# Patient Record
Sex: Female | Born: 1955
Health system: Southern US, Community
[De-identification: ages and names within clinical notes are randomized; demographics above are authoritative.]

## PROBLEM LIST (undated history)

## (undated) DIAGNOSIS — G35 Multiple sclerosis: Secondary | ICD-10-CM

## (undated) DIAGNOSIS — Z79899 Other long term (current) drug therapy: Secondary | ICD-10-CM

## (undated) DIAGNOSIS — L509 Urticaria, unspecified: Secondary | ICD-10-CM

## (undated) DIAGNOSIS — E039 Hypothyroidism, unspecified: Secondary | ICD-10-CM

## (undated) DIAGNOSIS — E079 Disorder of thyroid, unspecified: Secondary | ICD-10-CM

## (undated) DIAGNOSIS — I10 Essential (primary) hypertension: Secondary | ICD-10-CM

## (undated) HISTORY — DX: Essential (primary) hypertension: I10

## (undated) HISTORY — PX: KNEE SURGERY: SHX244

## (undated) HISTORY — PX: OTHER SURGICAL HISTORY: SHX169

## (undated) HISTORY — DX: Multiple sclerosis: G35

## (undated) HISTORY — DX: Disorder of thyroid, unspecified: E07.9

## (undated) HISTORY — DX: Urticaria, unspecified: L50.9

## (undated) HISTORY — DX: Other long term (current) drug therapy: Z79.899

## (undated) HISTORY — PX: TYMPANOSTOMY TUBE PLACEMENT: SHX32

## (undated) HISTORY — PX: VAGINAL HYSTERECTOMY: SUR661

---

## 1998-02-15 ENCOUNTER — Other Ambulatory Visit: Admission: RE | Admit: 1998-02-15 | Discharge: 1998-02-15 | Payer: Self-pay | Admitting: Obstetrics and Gynecology

## 1998-02-28 ENCOUNTER — Ambulatory Visit (HOSPITAL_COMMUNITY): Admission: RE | Admit: 1998-02-28 | Discharge: 1998-02-28 | Payer: Self-pay | Admitting: Obstetrics and Gynecology

## 1998-05-15 ENCOUNTER — Ambulatory Visit (HOSPITAL_COMMUNITY): Admission: RE | Admit: 1998-05-15 | Discharge: 1998-05-15 | Payer: Self-pay | Admitting: Family Medicine

## 1998-05-17 ENCOUNTER — Ambulatory Visit (HOSPITAL_COMMUNITY): Admission: RE | Admit: 1998-05-17 | Discharge: 1998-05-17 | Payer: Self-pay | Admitting: Family Medicine

## 1999-02-18 ENCOUNTER — Other Ambulatory Visit: Admission: RE | Admit: 1999-02-18 | Discharge: 1999-02-18 | Payer: Self-pay | Admitting: Obstetrics and Gynecology

## 1999-04-18 ENCOUNTER — Ambulatory Visit (HOSPITAL_COMMUNITY): Admission: RE | Admit: 1999-04-18 | Discharge: 1999-04-18 | Payer: Self-pay | Admitting: Family Medicine

## 1999-04-18 ENCOUNTER — Encounter: Payer: Self-pay | Admitting: Family Medicine

## 1999-08-05 ENCOUNTER — Emergency Department (HOSPITAL_COMMUNITY): Admission: EM | Admit: 1999-08-05 | Discharge: 1999-08-05 | Payer: Self-pay | Admitting: *Deleted

## 1999-08-05 ENCOUNTER — Encounter: Payer: Self-pay | Admitting: Emergency Medicine

## 2000-03-04 ENCOUNTER — Other Ambulatory Visit: Admission: RE | Admit: 2000-03-04 | Discharge: 2000-03-04 | Payer: Self-pay | Admitting: Obstetrics and Gynecology

## 2000-03-27 ENCOUNTER — Encounter: Payer: Self-pay | Admitting: Otolaryngology

## 2000-03-27 ENCOUNTER — Encounter: Admission: RE | Admit: 2000-03-27 | Discharge: 2000-03-27 | Payer: Self-pay | Admitting: Otolaryngology

## 2001-02-05 ENCOUNTER — Ambulatory Visit (HOSPITAL_COMMUNITY): Admission: RE | Admit: 2001-02-05 | Discharge: 2001-02-05 | Payer: Self-pay | Admitting: Internal Medicine

## 2001-02-05 ENCOUNTER — Encounter: Payer: Self-pay | Admitting: Internal Medicine

## 2001-02-05 ENCOUNTER — Encounter (INDEPENDENT_AMBULATORY_CARE_PROVIDER_SITE_OTHER): Payer: Self-pay | Admitting: *Deleted

## 2001-04-13 ENCOUNTER — Other Ambulatory Visit: Admission: RE | Admit: 2001-04-13 | Discharge: 2001-04-13 | Payer: Self-pay | Admitting: Obstetrics and Gynecology

## 2001-09-21 ENCOUNTER — Encounter: Payer: Self-pay | Admitting: Family Medicine

## 2001-09-21 ENCOUNTER — Ambulatory Visit (HOSPITAL_COMMUNITY): Admission: RE | Admit: 2001-09-21 | Discharge: 2001-09-21 | Payer: Self-pay | Admitting: Family Medicine

## 2001-09-22 ENCOUNTER — Ambulatory Visit (HOSPITAL_COMMUNITY): Admission: RE | Admit: 2001-09-22 | Discharge: 2001-09-22 | Payer: Self-pay | Admitting: Family Medicine

## 2001-09-22 ENCOUNTER — Encounter: Payer: Self-pay | Admitting: Family Medicine

## 2001-10-14 ENCOUNTER — Ambulatory Visit (HOSPITAL_COMMUNITY): Admission: RE | Admit: 2001-10-14 | Discharge: 2001-10-14 | Payer: Self-pay | Admitting: Family Medicine

## 2001-10-14 ENCOUNTER — Encounter: Payer: Self-pay | Admitting: Family Medicine

## 2002-03-09 ENCOUNTER — Encounter: Admission: RE | Admit: 2002-03-09 | Discharge: 2002-03-09 | Payer: Self-pay | Admitting: Family Medicine

## 2002-03-09 ENCOUNTER — Encounter: Payer: Self-pay | Admitting: Family Medicine

## 2002-04-12 ENCOUNTER — Encounter: Admission: RE | Admit: 2002-04-12 | Discharge: 2002-07-11 | Payer: Self-pay | Admitting: Neurosurgery

## 2002-08-25 ENCOUNTER — Other Ambulatory Visit: Admission: RE | Admit: 2002-08-25 | Discharge: 2002-08-25 | Payer: Self-pay | Admitting: Obstetrics and Gynecology

## 2002-11-07 ENCOUNTER — Ambulatory Visit (HOSPITAL_COMMUNITY): Admission: RE | Admit: 2002-11-07 | Discharge: 2002-11-07 | Payer: Self-pay | Admitting: Family Medicine

## 2002-11-07 ENCOUNTER — Encounter: Payer: Self-pay | Admitting: Family Medicine

## 2003-01-02 ENCOUNTER — Emergency Department (HOSPITAL_COMMUNITY): Admission: EM | Admit: 2003-01-02 | Discharge: 2003-01-03 | Payer: Self-pay | Admitting: *Deleted

## 2003-01-03 ENCOUNTER — Encounter: Payer: Self-pay | Admitting: Emergency Medicine

## 2003-01-19 ENCOUNTER — Encounter (HOSPITAL_COMMUNITY): Admission: RE | Admit: 2003-01-19 | Discharge: 2003-04-19 | Payer: Self-pay | Admitting: Internal Medicine

## 2003-01-20 ENCOUNTER — Encounter: Payer: Self-pay | Admitting: Internal Medicine

## 2003-01-23 ENCOUNTER — Encounter: Payer: Self-pay | Admitting: Internal Medicine

## 2004-02-15 ENCOUNTER — Other Ambulatory Visit: Admission: RE | Admit: 2004-02-15 | Discharge: 2004-02-15 | Payer: Self-pay | Admitting: Obstetrics and Gynecology

## 2004-02-20 ENCOUNTER — Emergency Department (HOSPITAL_COMMUNITY): Admission: EM | Admit: 2004-02-20 | Discharge: 2004-02-20 | Payer: Self-pay | Admitting: Emergency Medicine

## 2004-03-13 ENCOUNTER — Encounter: Payer: Self-pay | Admitting: Internal Medicine

## 2004-05-20 ENCOUNTER — Emergency Department (HOSPITAL_COMMUNITY): Admission: EM | Admit: 2004-05-20 | Discharge: 2004-05-20 | Payer: Self-pay | Admitting: Emergency Medicine

## 2004-06-24 ENCOUNTER — Ambulatory Visit (HOSPITAL_COMMUNITY): Admission: RE | Admit: 2004-06-24 | Discharge: 2004-06-24 | Payer: Self-pay | Admitting: Family Medicine

## 2004-09-07 ENCOUNTER — Encounter: Payer: Self-pay | Admitting: Internal Medicine

## 2004-10-04 ENCOUNTER — Ambulatory Visit (HOSPITAL_COMMUNITY): Admission: RE | Admit: 2004-10-04 | Discharge: 2004-10-04 | Payer: Self-pay | Admitting: Neurology

## 2005-04-16 ENCOUNTER — Other Ambulatory Visit: Admission: RE | Admit: 2005-04-16 | Discharge: 2005-04-16 | Payer: Self-pay | Admitting: Obstetrics and Gynecology

## 2005-09-20 ENCOUNTER — Emergency Department (HOSPITAL_COMMUNITY): Admission: EM | Admit: 2005-09-20 | Discharge: 2005-09-20 | Payer: Self-pay | Admitting: Family Medicine

## 2005-10-25 IMAGING — CR DG CHEST 2V
2 series · 2 of 2 positions shown · non-contrast
Comparison: none

CLINICAL DATA: Recovering addict.  Twist in lip. 
 TWO VIEW CHEST
 PA and lateral views of the chest are made 02/20/2004 at 8771 hours and are compared to a previous portable study of 01/03/2003 and show the heart, lungs, bony thorax and soft tissues to be normal. 
 IMPRESSION
 Normal chest.

[view not recorded (1 of 2)]
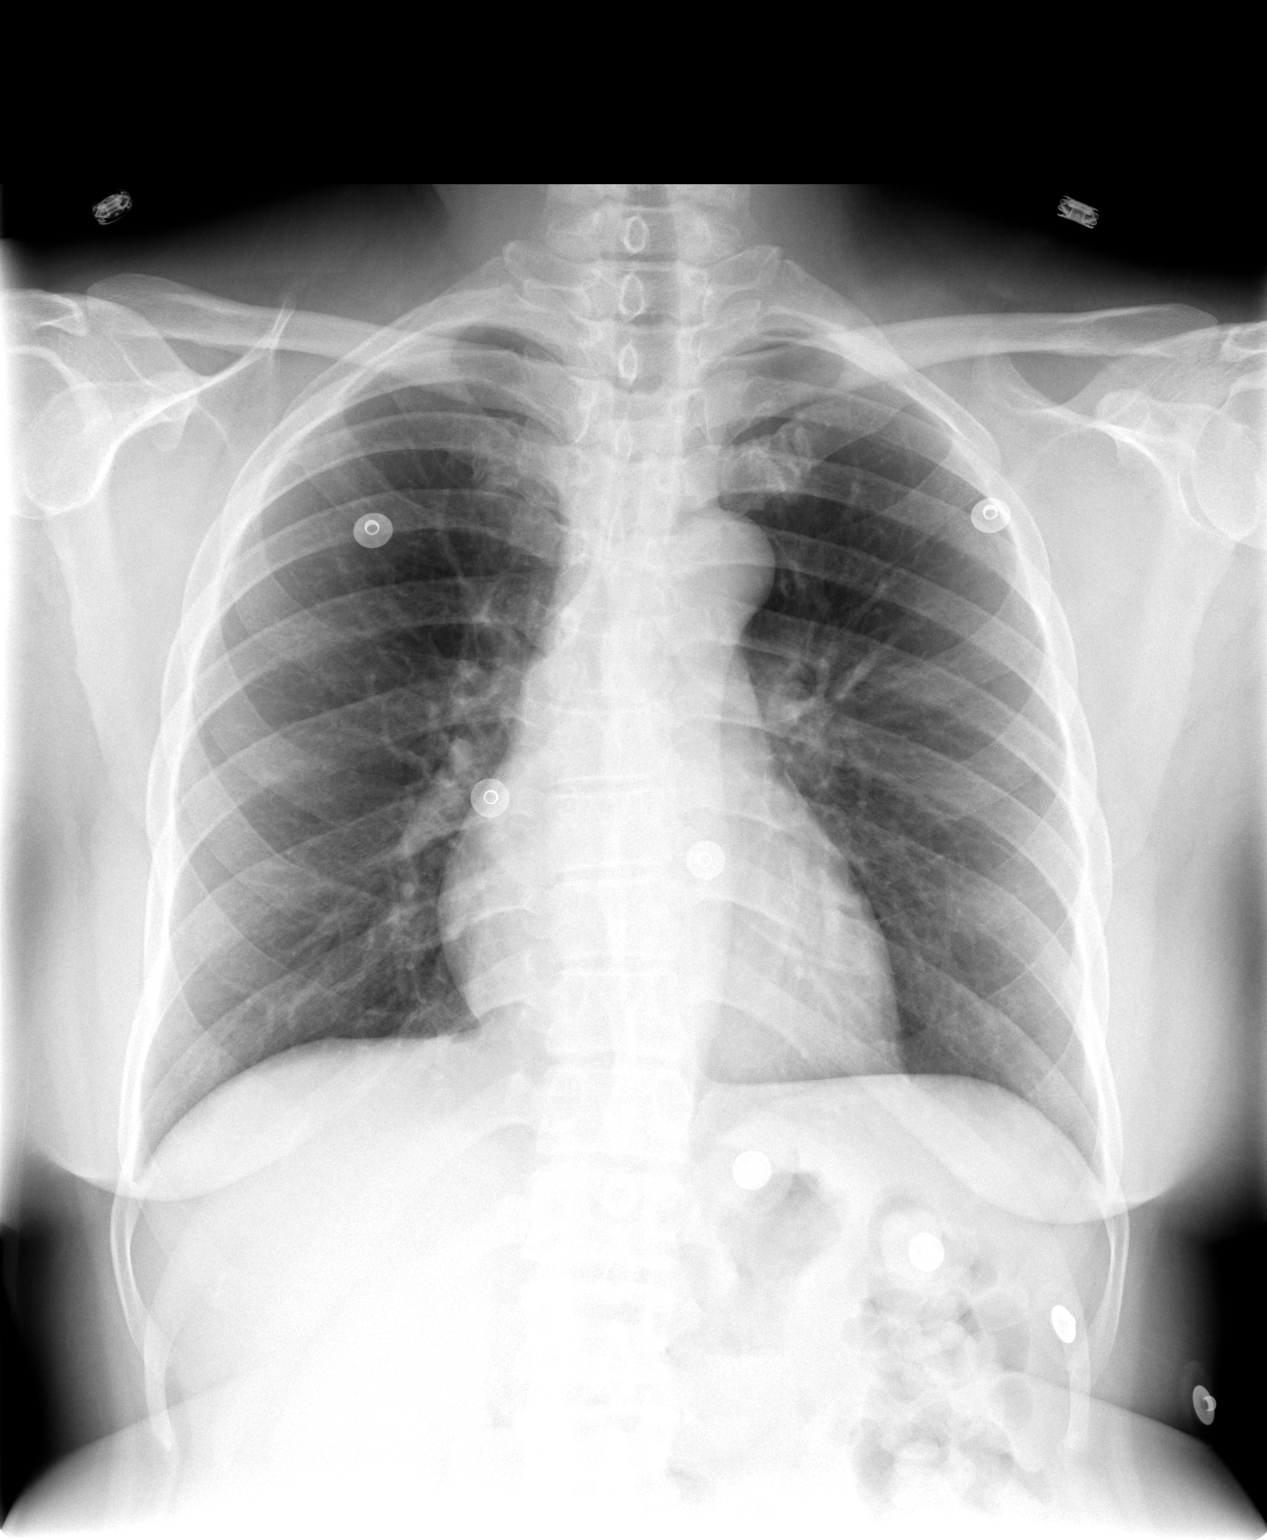

[view not recorded (2 of 2)]
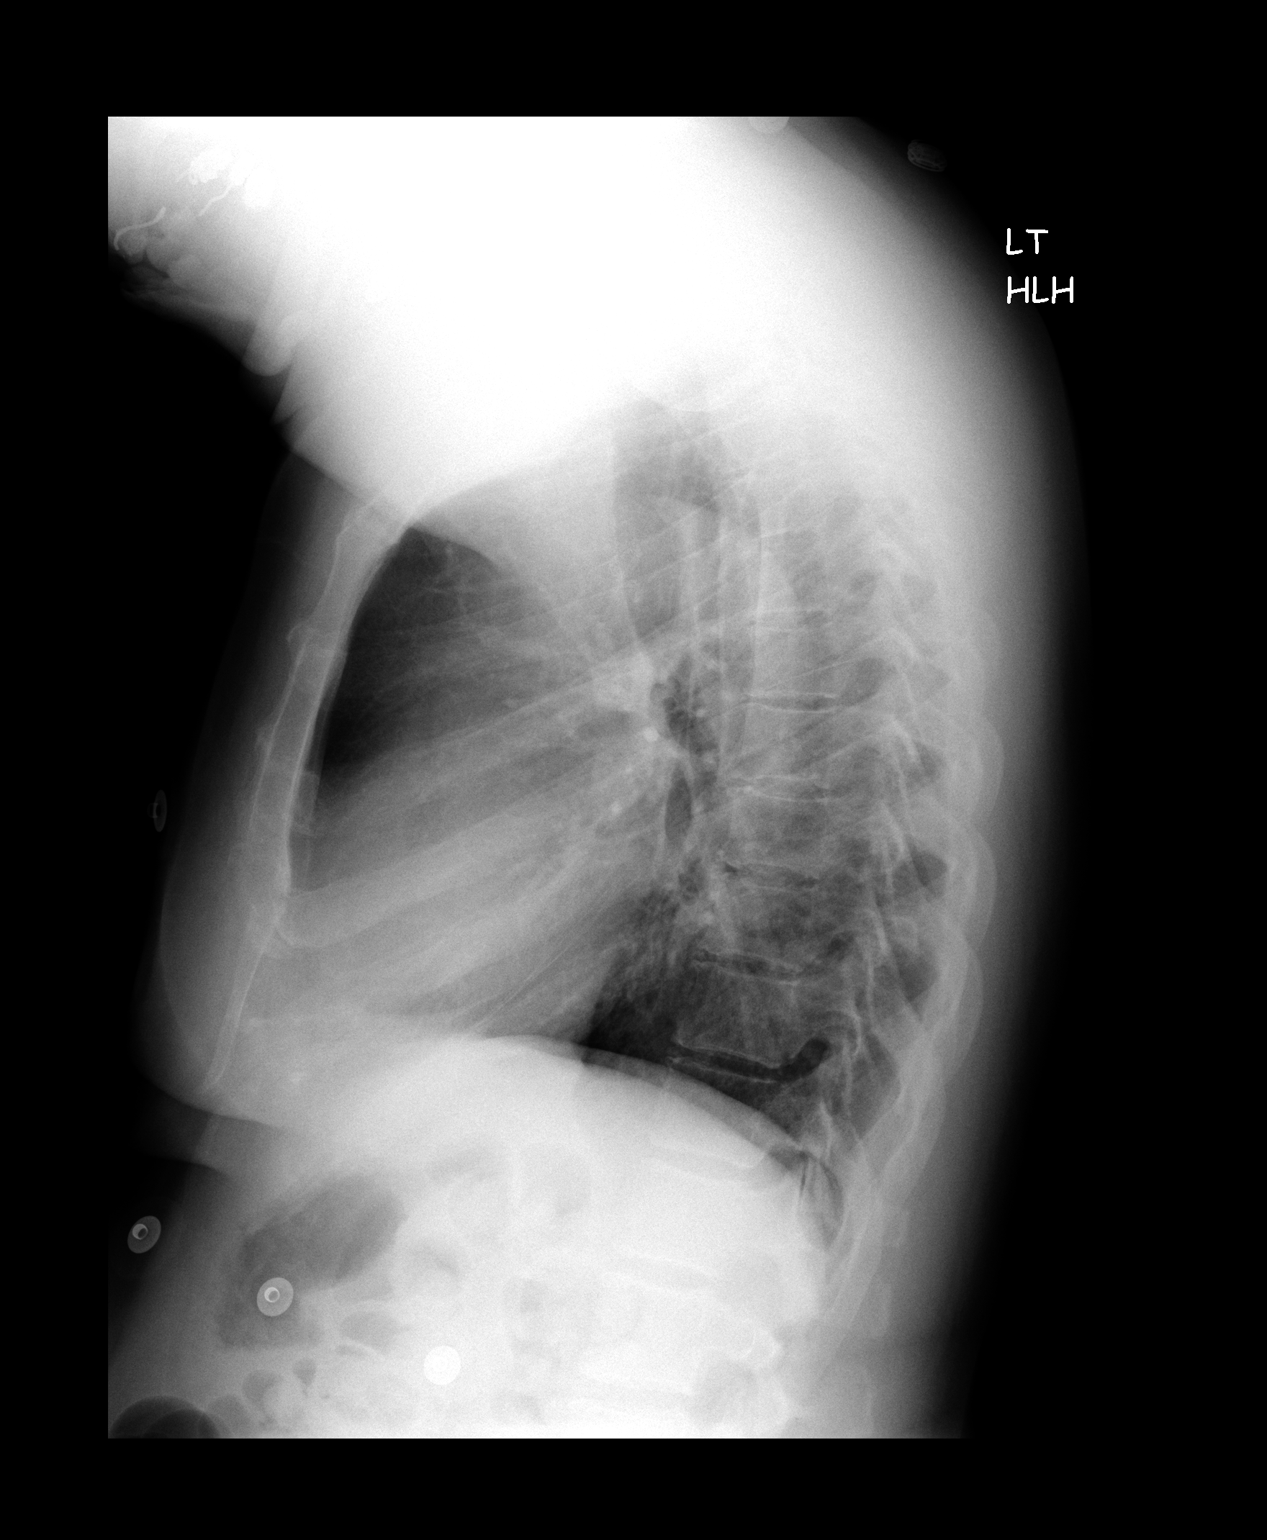

[2 of 2 positions shown; findings below may reference images not displayed]

## 2005-12-26 ENCOUNTER — Emergency Department (HOSPITAL_COMMUNITY): Admission: EM | Admit: 2005-12-26 | Discharge: 2005-12-26 | Payer: Self-pay | Admitting: Emergency Medicine

## 2006-09-10 ENCOUNTER — Inpatient Hospital Stay (HOSPITAL_COMMUNITY): Admission: EM | Admit: 2006-09-10 | Discharge: 2006-09-11 | Payer: Self-pay | Admitting: Emergency Medicine

## 2006-09-12 ENCOUNTER — Emergency Department (HOSPITAL_COMMUNITY): Admission: EM | Admit: 2006-09-12 | Discharge: 2006-09-12 | Payer: Self-pay | Admitting: Emergency Medicine

## 2006-11-02 ENCOUNTER — Inpatient Hospital Stay (HOSPITAL_COMMUNITY): Admission: EM | Admit: 2006-11-02 | Discharge: 2006-11-06 | Payer: Self-pay | Admitting: Emergency Medicine

## 2006-11-04 ENCOUNTER — Encounter (INDEPENDENT_AMBULATORY_CARE_PROVIDER_SITE_OTHER): Payer: Self-pay | Admitting: Cardiology

## 2007-10-31 ENCOUNTER — Emergency Department (HOSPITAL_COMMUNITY): Admission: EM | Admit: 2007-10-31 | Discharge: 2007-10-31 | Payer: Self-pay | Admitting: Emergency Medicine

## 2008-04-06 ENCOUNTER — Ambulatory Visit (HOSPITAL_COMMUNITY): Admission: RE | Admit: 2008-04-06 | Discharge: 2008-04-06 | Payer: Self-pay | Admitting: Internal Medicine

## 2009-04-10 ENCOUNTER — Encounter (INDEPENDENT_AMBULATORY_CARE_PROVIDER_SITE_OTHER): Payer: Self-pay | Admitting: *Deleted

## 2009-04-13 ENCOUNTER — Telehealth: Payer: Self-pay | Admitting: Internal Medicine

## 2009-04-24 ENCOUNTER — Ambulatory Visit: Payer: Self-pay | Admitting: Gastroenterology

## 2009-04-24 DIAGNOSIS — K602 Anal fissure, unspecified: Secondary | ICD-10-CM | POA: Insufficient documentation

## 2009-04-24 DIAGNOSIS — R198 Other specified symptoms and signs involving the digestive system and abdomen: Secondary | ICD-10-CM | POA: Insufficient documentation

## 2009-04-24 DIAGNOSIS — R12 Heartburn: Secondary | ICD-10-CM

## 2009-04-24 DIAGNOSIS — K625 Hemorrhage of anus and rectum: Secondary | ICD-10-CM

## 2009-05-15 ENCOUNTER — Ambulatory Visit: Payer: Self-pay | Admitting: Internal Medicine

## 2009-05-15 DIAGNOSIS — K5900 Constipation, unspecified: Secondary | ICD-10-CM | POA: Insufficient documentation

## 2009-05-15 DIAGNOSIS — Z860100 Personal history of colon polyps, unspecified: Secondary | ICD-10-CM | POA: Insufficient documentation

## 2009-05-15 DIAGNOSIS — R1032 Left lower quadrant pain: Secondary | ICD-10-CM

## 2009-05-15 DIAGNOSIS — K6289 Other specified diseases of anus and rectum: Secondary | ICD-10-CM

## 2009-05-15 DIAGNOSIS — K644 Residual hemorrhoidal skin tags: Secondary | ICD-10-CM | POA: Insufficient documentation

## 2009-05-22 ENCOUNTER — Ambulatory Visit: Payer: Self-pay | Admitting: Internal Medicine

## 2009-11-16 ENCOUNTER — Encounter: Admission: RE | Admit: 2009-11-16 | Discharge: 2009-11-16 | Payer: Self-pay | Admitting: Family Medicine

## 2010-08-08 ENCOUNTER — Encounter: Admission: RE | Admit: 2010-08-08 | Discharge: 2010-08-08 | Payer: Self-pay | Admitting: Obstetrics and Gynecology

## 2011-01-06 ENCOUNTER — Emergency Department (HOSPITAL_COMMUNITY): Admission: EM | Admit: 2011-01-06 | Payer: Self-pay | Source: Home / Self Care

## 2011-03-21 NOTE — H&P (Signed)
Susan Gomez, Susan Gomez              ACCOUNT NO.:  [id number]   MEDICAL RECORD NO.:  [id number]          PATIENT TYPE:  EMS   LOCATION:  MAJO                         FACILITY:  [facility]   PHYSICIAN:  Deanna Artis. Hickling, M.D.DATE OF BIRTH:  November 30, 1955   DATE OF ADMISSION:  09/10/2006  DATE OF DISCHARGE:                                HISTORY & PHYSICAL   CHIEF COMPLAINT:  Problem speaking, tremor in the right arm.   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  She was at [facility]  tonight when she had onset of inability to read or to speak.  She went on  home.  She was last known well around 1845.  Her husband discovered her  problem with her speech and tremor in her hands and insisted that she come  to the hospital.  They arrived at 2110.  Code Stroke was declared at 2131,  CT at 2138, interpreted by me at 2140.   The patient has a history of hypertension, multiple sclerosis on Betaseron,  and hypothyroidism.   PAST SURGICAL HISTORY:  Positive for hysterectomy.   MEDICATIONS:  1. Benazepril 05/10 mg one daily.  2. Betaseron every other day.  3. Synthroid 137 mcg daily.   DRUG ALLERGIES:  None.   FAMILY HISTORY:  Noncontributory.   REVIEW OF SYSTEMS:  Review of systems x12 is negative.  Patient has had no  illnesses, injuries or other organ system dysfunction.   SOCIAL HISTORY:  The patient does not use tobacco, alcohol or drugs.  She  quit all these when she stopped drugs.  She is married.  Her grandchildren  (three) stay at her home almost every night.   EXAMINATION TODAY:  VITAL SIGNS:  Blood pressure 157/104, resting pulse 64,  respirations 20, oxygen saturation 97%, temperature 97.1.  EARS, NOSE AND THROAT:  No bruits.  LUNGS:  Clear.  HEART:  No murmurs.  Pulses normal.  ABDOMEN:  Soft.  Bowel sounds normal.  No hepatosplenomegaly.  EXTREMITIES:  Normal.  NEUROLOGIC:  The patient is  left-handed.  She is awake, alert.  NIH Stroke  Scale is 3.  There is no dysphasia or dyspraxia.  She has dysarthria but is  really mumbling.  Cranial nerves:  Round, reactive pupils.  Visual fields  full to double simultaneous stimuli.  She is pursing her lips and screwing  her face up to the right side and when she drinks her face becomes  symmetrical, as it is when she smiles.  Tongue and uvula are midline.  Motor  examination:  Normal strength.  She has giveaway to a mild degree a right  leg.  There is no drift.  Fine motor movements were okay.  She has a right  factitious tremor of her hand and arm.  Sensory:  Right hypesthesias, she  splits the midline.  Tuning fork is greater on the left.  She has good  stereoagnosis.  Cerebellar examination:  Finger-to-nose is okay bilaterally.  Heel-knee-shin okay bilaterally.  Gait not tested.   IMPRESSION:  1. Paresthesias, 782.0.  2. Tremor 333.3.  The patient's hypesthesia, facial weakness and tremor      are functional.  3. Hypertension, 404.10.  4. Multiple sclerosis, 340.   PLAN:  We will admit the patient for evaluation.  It is possible that she  could be having an exacerbation of her MS, but I think it is unlikely.  In  my opinion, she has not had a stroke.  She is beyond the 3-hour window for  use of t-PA.  Because of her low NIH stroke scale and my strong suspicion  that this is functional, I would not place her in a study protocol.  MRI  scan of the brain without and with contrast will be carried on the morning  and if normal, we will discharge her to home.      Deanna Artis. Sharene Skeans, M.D.  Electronically Signed     WHH/MEDQ  D:  09/10/2006  T:  09/11/2006  Job:  [id number]   cc:   Holley Bouche, M.D.

## 2011-03-21 NOTE — Consult Note (Signed)
Susan Gomez, DOUCETTE NO.:  [id number]   MEDICAL RECORD NO.:  [id number]          PATIENT TYPE:  EMS   LOCATION:  MAJO                         FACILITY:  [facility]   PHYSICIAN:  Genene Churn. Love, M.D.    DATE OF BIRTH:  [birth year]   DATE OF CONSULTATION:  09/12/2006  DATE OF DISCHARGE:                                   CONSULTATION   REASON FOR CONSULTATION:  This 55 year old left-handed black married female  is seen in the emergency room for evaluation of tremors and mouth  tightening.  A code stroke was called.   HISTORY OF PRESENT ILLNESS:  Mrs. Fedorko has a past history of recovery from  drug use.  She has been free of drugs for 16 years.  She has had MS  diagnosed in February 2006 and has been on Betaseron therapy.  She has a  long history of hypertension and was admitted September 10, 2006 for tremors  and face tightening and discharged September 11, 2006.  At that time, she was  not found to have evidence of a stroke.  An MRI did show some small white-  matter changes but very few.  There was no evidence of an acute stroke  present.  Her telemetry in the hospital was unremarkable.  Her MRI showed no  diffusion abnormalities, nothing to suggest an acute stroke.  She was  discharged yesterday but today complained of tightening of the right side of  her face and began having tremors.  She came to the emergency room.  There  was no evidence of a seizure witnessed.  Code stroke was called.  She denies  any headache, single eye visual loss, double vision, etc.   PAST MEDICAL HISTORY:  Significant for multiple sclerosis, hypertension,  drug use in the past and anxiety.   MEDICATIONS:  1. Betaseron 0.25 mg subcu q.i.d.  2. Norvasc 5 mg daily.  3. Benazepril 10 mg p.o. daily.  4. Synthroid 157 mcg daily.   PHYSICAL EXAMINATION:  GENERAL:  A well-developed obese black female with  distractible tremor and mouth tightening.  VITAL SIGNS:  Blood pressure in right and left  arm was 180/100.  Heart rate  was 84.  NECK:  There were no bruits.  NEUROLOGIC:  She was alert and oriented x3.  Her visual fields were full.  Disks were flat.  Extraocular movements were full and corneals were present.  There was no 7th nerve palsy.  Tongue was midline.  Uvula was midline.  Gag  was present.  Sternocleidomastoid and trapezius testing were normal.  She  had a mild dysarthria.  Motor examination revealed that she had give-away  testing of the right hand and arm but no real drift.  She was intact to  pinprick touch, proprioception, and vibration.  Deep tendon reflexes 2+.  Plantar responses were downgoing.   LABORATORIES:  CT scan of the brain was normal and other blood studies were  pending at this time.   IMPRESSION:  1. Possible multiple sclerosis attack, code 340.  2. Anxiety with voluntary movement disorder, code 300.00 and 781.0.  3. Hypertension, code 796.3.  4. History of drug use.   PLAN:  The plan at this time is to give her a course of prednisone taper,  check her blood studies and let her return on a p.r.n. basis should she have  difficulties.           ______________________________  Genene Churn. Sandria Manly, M.D.     JML/MEDQ  D:  09/12/2006  T:  09/12/2006  Job:  [id number]

## 2011-03-21 NOTE — Consult Note (Signed)
NAMEMAKYIAH, LIE NO.:  [id number]   MEDICAL RECORD NO.:  [id number]          PATIENT TYPE:  EMS   LOCATION:  MAJO                         FACILITY:  [facility]   PHYSICIAN:  Genene Churn. Love, M.D.    DATE OF BIRTH:  12-03-1955   DATE OF CONSULTATION:  09/12/2006  DATE OF DISCHARGE:                                   CONSULTATION   ADDENDUM:   LABORATORY DATA:  Sodium is 137, potassium is 3.5, chloride is 102, CO2  content is 24, glucose is 97, BUN is 11, creatinine is 0.9 and calcium is  9.4.  Total protein is 7.4, albumin is 4, SGOT is 16, SGPT is 9, alkaline  phosphatase is 38, total bilirubin is 1.  White blood cell count is 5600,  hemoglobin is 13, hematocrit is 37.2 and platelet count is 258 K.  ProTime  was 12.4, INR was 0.9, and PTT was 30.           ______________________________  Genene Churn. Sandria Manly, M.D.     JML/MEDQ  D:  09/12/2006  T:  09/12/2006  Job:  [id number]

## 2011-03-21 NOTE — Consult Note (Signed)
Susan Gomez, Susan Gomez              ACCOUNT NO.:  [id number]   MEDICAL RECORD NO.:  [id number]          PATIENT TYPE:  INP   LOCATION:  2023                         FACILITY:  [facility]   PHYSICIAN:  Armanda Magic, M.D.     DATE OF BIRTH:  Mar 02, 1956   DATE OF CONSULTATION:  DATE OF DISCHARGE:                                 CONSULTATION   CHIEF COMPLAINT:  Chest pain.   HISTORY OF PRESENT ILLNESS:  This is a 55 year old black female with a  history of hypertension, hypothyroidism, multiple sclerosis who  presented to the emergency room complaining of chest pain.  Her symptoms  started about 3-4 days ago and she saw her primary care physician.  She  was evaluated by me in the office and was set up for an outpatient  stress test because of atypical chest pain but because of persistence of  symptoms she presented to the emergency room.  Cardiac enzymes were  normal in the ER.  EKG was unremarkable except for nonspecific T-wave  abnormality as well as her chest x-ray was unremarkable too.  She did  have some problems with sinus bradycardia.  She has been noticing some  problems with fatigue and dizziness recently.  She describes the pain as  intermittent sharp pain that radiates from her midsternal area up into  her throat, described also as a burning sensation.  There is some  shortness of breath occasionally.  She does have problems with  gastroesophageal reflux but says typically these are not symptoms.   PAST MEDICAL HISTORY:  Includes gastroesophageal reflux, hypertension,  hypothyroidism,  multiple sclerosis.   MEDICATIONS:  Include amlodipine, benazepril 10/20 mg p.o. daily, HCTZ  25 mg a day, metoprolol 25 mg a day, Prevacid 30 mg a day, Betaseron 1  mL p.o. every other day and Synthroid 137 mcg daily.   ALLERGIES:  None.   SOCIAL HISTORY:  She denies any tobacco or alcohol use.  No IV drug use.  She used to do cocaine but only some inhaled.   FAMILY HISTORY:  Her father died  of cancer.  Her mother died of an MI.  She has a brother who died at 39 of an MI.   REVIEW OF SYSTEMS:  Otherwise as stated in HPI includes lightheadedness  and problems with feeling tired.   PHYSICAL EXAMINATION:  GENERAL:  Her blood pressure is 111/64 mmHg,  pulse 43, respirations 18, O2 saturations 98% on room air. She is a well-  developed, well-nourished obese black female in no acute distress.  HEENT:  Benign.  NECK:  Supple without lymphadenopathy.  Carotid upstrokes +2  bilaterally, no bruits.  LUNGS: Clear to auscultation throughout.  HEART:  Regular rate and rhythm but slow, no murmurs, rubs or gallops.  Normal S1, S2.  ABDOMEN:  Benign.  EXTREMITIES: No edema.   LABORATORY DATA:  Her sodium is 138, potassium 3.4, chloride 105,  glucose 86, BUN 8.  CBC, white cell count 4.9, hemoglobin 12.6,  hematocrit 36.1, platelet count 261.  LFTs are normal.  INR 0.9. D-dimer  is less than 0.22.  Initial CPK  is 155 with MB of 0.5, troponin less  than 0.05.  Second CPK was 136, MB 0.5, troponin 0.02.  Chest x-ray  shows stable mild cardiomegaly.  EKG shows sinus bradycardia 45 beats  per minute with nonspecific T-wave abnormality, normal QTC, occasional  PVC noted on EKG back in November.   IMPRESSION:  1. Atypical chest pain in a patient with cardiac risk factors      including obesity, hypertension, family history of early age,      cardiac enzymes are negative x2, D-dimer is negative.  EKG shows      nonspecific T-wave changes.  2. Multiple sclerosis.  3. Hypertension.  4. Bradycardia with fatigue and lightheadedness.  She did receive      atropine last night for a heart rate of 35 when she was lying in      bed.  She apparently says she has not taken her beta blocker in 5      days but had been on metoprolol.  5. Hypothyroidism.   PLAN:  1. Stress Cardiolite study the morning.  2. Check 2D echocardiogram to rule out structural heart disease.  3. Check a TSH to make sure  she does not have significant      hypothyroidism.      Armanda Magic, M.D.  Electronically Signed     TT/MEDQ  D:  11/03/2006  T:  11/03/2006  Job:  [id number]   cc:   Deboraha Sprang @ [facility]

## 2011-03-21 NOTE — Discharge Summary (Signed)
NAMEPRICELLA, Susan Gomez              ACCOUNT NO.:  [id number]   MEDICAL RECORD NO.:  [id number]          PATIENT TYPE:  INP   LOCATION:  2023                         FACILITY:  [facility]   PHYSICIAN:  Barnetta Chapel, MDDATE OF BIRTH:  1955/11/07   DATE OF ADMISSION:  11/02/2006  DATE OF DISCHARGE:  11/06/2006                               DISCHARGE SUMMARY   PRIMARY CARE PHYSICIAN:  Dr. Holley Bouche.   ADMITTING DIAGNOSES:  Includes:  1. Chest pain.  2. Bradycardia.  3. Mild renal insufficiency.  4. Hypertension.  5. Hypothyroidism.   DISCHARGE DIAGNOSES:  Includes:  1. Hypothyroidism.  2. Bradycardia.  3. Chest pain, the cause is unknown.  4. Hypertension.   DISCHARGE MEDICATIONS:  Include:  1. HCTZ 25 mg once daily.  2. Prevacid 30 mg p.o. once daily.  3. Lisinopril 20 mg p.o. once daily.  4. Synthroid 150 mcg p.o. once daily.  5. EC aspirin 81 mg p.o. once daily.  6. Lexapro 10 mg p.o. once daily.  7. Betaseron 150 p.o. every other day.   CONSULTATIONS DONE:  She was seen by Dr. Armanda Magic.  A cardiac stress  test was planned but not done because significant bradycardia.   SIGNIFICANT LAB TESTS:  Cardiac enzymes were negative.  EKG revealed  significant bradycardia.  Also, the Holter monitor revealed heart rate  that was as low as 33 beats per minute.  A thyroid stimulating hormone  level was 73.01.  The patient's creatinine was 1.1.   BRIEF HISTORY AND HOSPITAL COURSE:  Please refer to the H&P done on  November 02, 2006.  The patient is a 55 year old female with past  medical history significant for hypothyroidism, hypertension and GERD.  The patient was admitted with history of chest pain.  According to the  patient, she said that the chest pain was left-sided and was radiating  up to the neck.  The pain would last for about 15 minutes.  On  admission, the patient's EKG revealed significant bradycardia.  The  cardiac enzymes were all negative.  Cardiology  consult was called and  the patient was seen by Dr. Armanda Magic of the [facility].  The plan was for the patient to have the stress test but this was  cancelled because of significant bradycardia.  TSh was elevated (Please  see above).  The patient's Synthroid was increased from 137 mcg p.o.  once daily to 150 mcg p.o. once daily.  The patient insists on being  compliant with the thyroid medication.  We increased dose of Synthroid,  the patient's heart rate did gradually increase to 50-60s.  On the day  of discharge, the heart rate was in the 60s and the chest pain had  resolved.  No shortness of breath.  The plan is for the patient to have  a cardiac stress test as an outpatient.   DISCHARGE PLANS:  1. Discharge patient back home today.  2. Patient to follow up with the primary care physician in a week.  3. There will be need for the primary care physician to check the  patient's TSH in a week.  4. The patient is to follow up with Dr. Fraser Din Clarkin1-2 weeks..      Dr. Chestine Spore sees the patient for the thyroid problem.  5. Follow up with Dr. Armanda Magic of [facility].  This patient      will need an outpatient cardiac stress test.  6. Cardiac diet.  7. Activity as tolerated.      Barnetta Chapel, MD  Electronically Signed     SIO/MEDQ  D:  11/06/2006  T:  11/07/2006  Job:  [id number]   cc:   Holley Bouche, M.D.  Margaretmary Bayley, M.D.  Armanda Magic, M.D.

## 2011-03-21 NOTE — H&P (Signed)
Susan Gomez, Susan Gomez              ACCOUNT NO.:  [id number]   MEDICAL RECORD NO.:  [id number]          PATIENT TYPE:  INP   LOCATION:  1826                         FACILITY:  [facility]   PHYSICIAN:  Hollice Espy, M.D.DATE OF BIRTH:  1956-09-10   DATE OF ADMISSION:  11/02/2006  DATE OF DISCHARGE:                              HISTORY & PHYSICAL   CONSULTANTS ON THIS CASE:  Dr. Armanda Magic, [facility].   PRIMARY CARE PHYSICIAN:  Heritage manager at [facility].   CHIEF COMPLAINT:  Chest pain.   HISTORY OF PRESENT ILLNESS:  The patient is a 55 year old white female  past medical history of hypertension and hypothyroidism who presents to  the emergency room complaining of continued chest discomfort.  Actually  her symptoms started about 3-4 days ago and she saw her PCP.  At that  time she was referred over to Dr. Mayford Knife who saw the patient and had  plans to set up the patient for an outpatient stress test; however, her  symptoms persisted and she came into the emergency room for further  evaluation.  In the emergency room the patient had labs checked and had  a normal set of cardiac enzymes.  Her EKG was unremarkable as was her  chest x-ray.  She was noted to have some spiked sinus bradycardia,  however.  The patient now otherwise is doing well however, her symptoms  have persisted and intermittent chest burning midsternal radiated to the  left side with associated shortness of breath and also up to her jaw  bilaterally.  She feels tired but she denies any headaches, vision  changes, dysphagia, no palpitations, no wheezing or coughing.  She says  when her reflux flares up this is not what it feels like.  She denies  any abdominal pain, hematuria, dysuria, constipation, diarrhea, focal  extremity numbness, weakness or pain.  Review of systems is otherwise  negative.   PAST MEDICAL HISTORY:  The patient's past medical history includes  hyperthyroidism, hypertension, GERD.   MEDICATIONS:  She is on amlodipine/benazepril 10/25 p.o. daily,  hydrochlorothiazide 25 p.o. daily, metoprolol 25 p.o. daily, Prevacid 30  p.o. daily, Betaseron 1 mL p.o. every other day and Synthroid 137 mcg  p.o. daily.   ALLERGIES:  SHE HAS NO KNOWN DRUG ALLERGIES.   SOCIAL HISTORY:  She denies any tobacco, alcohol or drug use.   FAMILY HISTORY:  Noncontributory.   PHYSICAL EXAMINATION:  PATIENT'S VITALS ON ADMISSION:  Temperature 97.4,  heart rate 45, blood pressure 138/95, respirations 20, O2 saturation 98%  on room air.  GENERAL:  The patient is alert and oriented x3 in no apparent distress.  HEENT:  Normocephalic, atraumatic.  Her mucous membranes are moist.  She  has no carotid bruits.  HEART:  Regular rhythm, mild bradycardia.  LUNGS:  Clear to auscultation bilaterally.  ABDOMEN:  Soft, nontender, nondistended.  Positive bowel sounds.  EXTREMITIES:  No clubbing, cyanosis or edema.   LABORATORY WORK:  EKG shows sinus bradycardia.  INR is 1.  D-dimer less  than 0.22.  CPK 80.6, MB 1, troponin I less than 0.05.  Sodium 138,  potassium 3.4, chloride 105, bicarb 28, BUN 8, creatinine 1.2, glucose  86.   ASSESSMENT AND PLAN:  1. Chest pain.  I have discussed with Dr. Mayford Knife who will see the      patient and plan for a stress test on November 04, 2006.  2. Bradycardia.  We will check a TSH level.  3. Mild renal insufficiency.  This may be some mild dehydration but      with a normal BUN may be long term secondary to hypertension.  4. Hypertension.  Continue medications.  5. Hypothyroidism.  Continue Synthroid.      Hollice Espy, M.D.  Electronically Signed     SKK/MEDQ  D:  11/02/2006  T:  11/02/2006  Job:  [id number]   cc:   Deboraha Sprang at [facility]

## 2011-03-21 NOTE — Discharge Summary (Signed)
NAMEZANAYA, BAIZE              ACCOUNT NO.:  [id number]   MEDICAL RECORD NO.:  [id number]          PATIENT TYPE:  INP   LOCATION:  3030                         FACILITY:  [facility]   PHYSICIAN:  Deanna Artis. Hickling, M.D.DATE OF BIRTH:  11-26-1955   DATE OF ADMISSION:  09/10/2006  DATE OF DISCHARGE:  09/11/2006                                 DISCHARGE SUMMARY   FINAL DIAGNOSES:  1. Dysarthria, 784.5; right body numbness, 782.0; tremor, 333.1.  These      all appear to be functional in nature.  2. Hypertension.  3. Multiple sclerosis.   PROCEDURES:  1. MRI of the brain without and with contrast showed no acute lesions or      scattered areas of white matter change that were not classic for      multiple sclerosis in particular, none within the corpus callosum and      none that were in the immediate adjacent periventricular white matter.  2. CT scan of the brain, normal.   COMPLICATIONS:  None.   SUMMARY OF HOSPITALIZATION:  Patient was admitted with inability to speak,  which cleared to a dysarthria.  Complaints of right-sided numbness, right  facial weakness, and a right tremor that proved to be factitious.  She had a  nonfocal neurologic examination other than the areas noted.  She had a  swallowing study, which was normal.  Her NIH Stroke Scale was 3 (1 for the  sensory, 1 for the facial weakness, and 1 for the dysarthria).  The patient  was pursing her lips and pulling them up to the right side.  She was doing  this much less today.  When she drinks fluid it goes away altogether  When  she smiles, she has a symmetric facial strength.   On examination today, she has no focal or lateralized findings.  Her gait is  normal.  I do not see evidence of a tremor.  The hypesthesia is gone.  Temperature 97.4.  Blood pressure 159/103.  Resting pulse 63.  Resting  respirations 22.  Oxygen saturation 100.   PLAN:  1. We will have her go home on Norvasc 10 mg a day.  2. And continue  benazepril at its current dose of 10 mg a day.  3. She needs to see her primary care doctor for further titration of her      blood pressure.  She should follow up in December 2007 at [facility] for her multiple sclerosis.  She will be seen by      Darrol Angel.  I will be there that day and would be happy to assist      in her care.  She is ordinarily seen by Dr. Clearnce Hasten.   Laboratory studies were dictated in the history and physical examination and  will not be recounted here.  Patient is discharged in improved condition.      Deanna Artis. Sharene Skeans, M.D.  Electronically Signed     WHH/MEDQ  D:  09/11/2006  T:  09/12/2006  Job:  [id number]

## 2011-08-08 LAB — I-STAT 8, (EC8 V) (CONVERTED LAB)
BUN: 6
Bicarbonate: 27.3 — ABNORMAL HIGH
Glucose, Bld: 97
Sodium: 142
TCO2: 29
pCO2, Ven: 44.2 — ABNORMAL LOW
pH, Ven: 7.4 — ABNORMAL HIGH

## 2011-08-08 LAB — POCT CARDIAC MARKERS
CKMB, poc: <1 — ABNORMAL LOW
Myoglobin, poc: 51.9
Operator id: 261381
Troponin i, poc: <0.05

## 2011-08-08 LAB — DIFFERENTIAL
Basophils Absolute: 0.1
Eosinophils Relative: 2
Lymphocytes Relative: 61 — ABNORMAL HIGH
Lymphs Abs: 3.8
Neutro Abs: 1.8

## 2011-08-08 LAB — POCT I-STAT CREATININE: Operator id: 261381

## 2011-08-08 LAB — CBC
HCT: 35.7 — ABNORMAL LOW
Hemoglobin: 12.1
Platelets: 302
WBC: 6.3

## 2012-04-05 ENCOUNTER — Telehealth: Payer: Self-pay

## 2012-05-18 ENCOUNTER — Other Ambulatory Visit: Payer: Self-pay | Admitting: Family Medicine

## 2012-05-18 DIAGNOSIS — R2230 Localized swelling, mass and lump, unspecified upper limb: Secondary | ICD-10-CM

## 2012-05-21 ENCOUNTER — Ambulatory Visit
Admission: RE | Admit: 2012-05-21 | Discharge: 2012-05-21 | Disposition: A | Discharge status: Home / Self Care | Payer: BC Managed Care – PPO | Source: Ambulatory Visit | Attending: Family Medicine | Admitting: Family Medicine

## 2012-05-21 DIAGNOSIS — R2230 Localized swelling, mass and lump, unspecified upper limb: Secondary | ICD-10-CM

## 2013-01-24 ENCOUNTER — Other Ambulatory Visit: Payer: Self-pay | Admitting: Internal Medicine

## 2013-01-24 DIAGNOSIS — E042 Nontoxic multinodular goiter: Secondary | ICD-10-CM

## 2013-01-28 ENCOUNTER — Ambulatory Visit (HOSPITAL_COMMUNITY)
Admission: RE | Admit: 2013-01-28 | Discharge: 2013-01-28 | Disposition: A | Discharge status: Home / Self Care | Payer: BC Managed Care – PPO | Source: Ambulatory Visit | Attending: Internal Medicine | Admitting: Internal Medicine

## 2013-01-28 DIAGNOSIS — R599 Enlarged lymph nodes, unspecified: Secondary | ICD-10-CM | POA: Insufficient documentation

## 2013-01-28 DIAGNOSIS — E042 Nontoxic multinodular goiter: Secondary | ICD-10-CM

## 2013-09-23 ENCOUNTER — Encounter: Payer: Self-pay | Admitting: Neurology

## 2013-09-23 ENCOUNTER — Ambulatory Visit (INDEPENDENT_AMBULATORY_CARE_PROVIDER_SITE_OTHER): Payer: BC Managed Care – PPO | Admitting: Neurology

## 2013-09-23 VITALS — BP 151/98 | HR 49 | Ht 63.75 in | Wt 189.0 lb

## 2013-09-23 DIAGNOSIS — G35 Multiple sclerosis: Secondary | ICD-10-CM

## 2013-09-23 MED ORDER — GABAPENTIN 300 MG PO CAPS: 300.0000 mg | ORAL_CAPSULE | Freq: Three times a day (TID) | ORAL | Status: DC

## 2013-09-23 NOTE — Progress Notes (Signed)
_  [facility]  PATIENT: Susan Gomez DOB: May 07, 1956  HISTORICAL  [name] is a 57 year-old black female, referred by her primary care physician [name], to continue followup for her multiple sclerosis, and recent worsening of low back pain, last clinical visit was in 2011   She is a prior patient of [name] since 2005. with diagnosis of relapsing remitting multiple sclerosis.  The inital symptoms was facial asymetry, later also developed transient right leg weakness. The diagnosis is confirmed by abnormal MRI brain, cervical, and lumbar puncture showed  more than 2 oligoclonal bands.   She has been on Betaseron every other day since 2005,. She denies any new onset symptoms or clear clinical flareups ever since, such as weakness, sensory changes, blurred vision or double vision, speech or swallowing problems, bowel or bladder difficulties, spasticity.   She complains of right side low back pain since 2000, right hip pain, shooting pain to right lateral thigh and leg,  failed to improve by physical therapy, and stretching exercise. she denies incontinence  Following her last visit, she had  MRI brain in 2012 showed subtle confluent periventricular and subcortical white matter hyperintensitioes which are comaptible with demyelinating disease but appear unchanged from MRI 09/11/06. No enhancing lesions, T 1 black holes or significant atrophy is seen.  MRI scan of the lumbar spine shows only minor disc signal and facet degenerative changes without significant compression.  She has lost followup because of the financial burden, She has been out of Betaseron since 2012, there was no clear flareup in between,  She came back because of worsening bilateral lower extremity pain, shooting pain to both legs, continued low back pain, she has gait difficulty because of the pain, she denies bilateral feet or hands paresthesia, she denies bowel and bladder  incontinence,  She reported a history of right eye bleeding of unsure etiology since July fourth 2013,, was under the care of opathamologist [name], just received intra-epidural injection yesterday, with right conjunctiva small hematoma,  She also complains of 3 months history of left lip spasm, intermittent, intermitted right parietal area headaches,  She is a recovered addict of alcohol, cocaine, prescription medications  Review of Systems  Out of a complete 14 system review, the patient complains of only the following symptoms, and all other reviewed systems are negative.  Weight gain, murmur, feeling hot, feeling, flushing, achy muscles, energy, numbness, slurred speech, difficulty swallowing, snoring,  ALLERGIES: Allergies  Allergen Reactions  . Codeine     HOME MEDICATIONS: Lasix, Levothyroxine  PAST MEDICAL HISTORY: Past Medical History  Diagnosis Date  . MS (multiple sclerosis)   . Encounter for long-term (current) use of other medications   . High blood pressure   . Thyroid disease     PAST SURGICAL HISTORY: Past Surgical History  Procedure Laterality Date  . None      FAMILY HISTORY: History reviewed. No pertinent family history.  SOCIAL HISTORY:  History   Social History  . Marital Status: Married    Spouse Name: [name]    Number of Children: 3  . Years of Education: 15+   Occupational History  . Not on file.   Social History Main Topics  . Smoking status: Never Smoker   . Smokeless tobacco: Never Used  . Alcohol Use: No  . Drug Use: No  . Sexual Activity: Not on file   Other Topics Concern  . Not on file   Social History Narrative  Patient lives at home with her husband [name]).   Patient has 3 children and husbands has 4 children.   Patient has college education.   Patient is left-handed.   Patient drinks one cup of coffee in the am and two cups at night.          PHYSICAL EXAM   Filed Vitals:   09/23/13 1045  BP: 151/98   Pulse: 49  Height: 5' 3.75" (1.619 m)  Weight: 189 lb (85.73 kg)    Body mass index is 32.71 kg/(m^2).   Generalized: In no acute distress  Neck: Supple, no carotid bruits   Cardiac: Regular rate rhythm  Pulmonary: Clear to auscultation bilaterally  Musculoskeletal: No deformity  Neurological examination  Mentation: Alert oriented to time, place, history taking, and causual conversation  Cranial nerve II-XII: Pupils were equal round reactive to light extraocular movements were full, visual field were full on confrontational test. Right conjunctiva small hematoma. Fundi were sharp bilaterally.  Facial sensation and strength were normal. hearing was intact to finger rubbing bilaterally. Uvula tongue midline.  head turning and shoulder shrug and were normal and symmetric.Tongue protrusion into cheek strength was normal.  Motor: normal tone, bulk and strength, gave away weakness at bilateral hip flexion, knee flexion.  Sensory: Intact to fine touch, pinprick, preserved vibratory sensation, and proprioception at toes.  Coordination: Normal finger to nose, heel-to-shin bilaterally there was no truncal ataxia  Gait: Rising up from seated position without assistance, normal stance, without trunk ataxia, moderate stride, good arm swing, smooth turning, able to perform tiptoe, and heel walking without difficulty.   Romberg signs: Negative  Deep tendon reflexes: Brachioradialis 2/2, biceps 2/2, triceps 2/2, patellar 2/2, Achilles 2/2, plantar responses were flexor bilaterally.   DIAGNOSTIC DATA (LABS, IMAGING, TESTING) - I reviewed patient records, labs, notes, testing and imaging myself where available.  Lab Results  Component Value Date   WBC 6.3 10/31/2007   HGB 13.3 10/31/2007   HCT 39.0 10/31/2007   MCV 90.7 10/31/2007   PLT 302 10/31/2007      Component Value Date/Time   NA 142 10/31/2007 0517   K 3.7 10/31/2007 0517   CL 109 10/31/2007 0517   GLUCOSE 97 10/31/2007  0517   BUN 6 10/31/2007 0517   CREATININE 0.9 10/31/2007 0517    ASSESSMENT AND PLAN   57 years old right-handed African American female, with past medical history of relapsing remitting multiple sclerosis, has been on Betaseron treatment from 57 yo 2012 2012, now presenting with worsening low back pain, bilateral lower extremity shooting pain, spasticity, paresthesia   previous MRI lumbar in 2012 has demonstrated mild degenerative disc disease, no significant foraminal or canal stenosis,  1 she wants to restarted her Betaseron treatment, we will proceed with repeat MRI of the brain with and without contrast, 2.  she also complains of left facial spasm, Add on Neurontin 300 mg 3 times a day, 3. return to clinic in 2 months, will review her MRI, will make decision about whether we will start her  on long term disease modifying medication or not      Levert Feinstein, M.D. Ph.D.  [facility] [address], [address] [place], [place] [address] [phone]

## 2013-11-21 ENCOUNTER — Telehealth: Payer: Self-pay | Admitting: Neurology

## 2013-11-21 NOTE — Telephone Encounter (Signed)
Patient was notified and an office visit was scheduled for November 22, 2013 at 8:30 am with Dr. Terrace Arabia.

## 2013-11-21 NOTE — Telephone Encounter (Signed)
Patient requesting sooner appointment than the one already scheduled for 11/30/13 with Dr. Terrace Arabia because she is having right side jaw pain and it hurts when she talks, sometimes she says she is unable to talk because of the pain. Patient is also experiencing pain near her eye. Please call the patient to schedule.

## 2013-11-22 ENCOUNTER — Ambulatory Visit (INDEPENDENT_AMBULATORY_CARE_PROVIDER_SITE_OTHER): Payer: BC Managed Care – PPO | Admitting: Neurology

## 2013-11-22 ENCOUNTER — Encounter (INDEPENDENT_AMBULATORY_CARE_PROVIDER_SITE_OTHER): Payer: Self-pay

## 2013-11-22 ENCOUNTER — Encounter: Payer: Self-pay | Admitting: Neurology

## 2013-11-22 VITALS — BP 138/94 | HR 58 | Ht 63.0 in | Wt 193.0 lb

## 2013-11-22 DIAGNOSIS — G35 Multiple sclerosis: Secondary | ICD-10-CM

## 2013-11-22 MED ORDER — NORTRIPTYLINE HCL 25 MG PO CAPS: 50.0000 mg | ORAL_CAPSULE | Freq: Every day | ORAL | Status: DC

## 2013-11-22 MED ORDER — GABAPENTIN 300 MG PO CAPS: 600.0000 mg | ORAL_CAPSULE | Freq: Three times a day (TID) | ORAL | Status: DC

## 2013-11-22 NOTE — Progress Notes (Addendum)
_  [facility]  PATIENT: Susan Gomez DOB: 1956-06-09  HISTORICAL  Ms. Susan Gomez is a 58 year-old black female, referred by her primary care physician [name], to continue followup for her multiple sclerosis, and recent worsening of low back pain, last clinical visit was in 2011   She is a prior patient of [name] since 2005. with diagnosis of relapsing remitting multiple sclerosis.  The inital symptoms was facial asymetry, later also developed transient right leg weakness. The diagnosis is confirmed by abnormal MRI brain, cervical, and lumbar puncture showed  more than 2 oligoclonal bands.   She has been on Betaseron every other day since 2005,. She denies any new onset symptoms or clear clinical flareups ever since, such as weakness, sensory changes, blurred vision or double vision, speech or swallowing problems, bowel or bladder difficulties, spasticity.   She complains of right side low back pain since 2000, right hip pain, shooting pain to right lateral thigh and leg,  failed to improve by physical therapy, and stretching exercise. she denies incontinence  Following her last visit, she had  MRI brain in 2012 showed subtle confluent periventricular and subcortical white matter hyperintensitioes which are comaptible with demyelinating disease but appear unchanged from MRI 09/11/06. No enhancing lesions, T 1 black holes or significant atrophy is seen.  MRI scan of the lumbar spine shows only minor disc signal and facet degenerative changes without significant compression.  She has lost followup because of the financial burden, She has been out of Betaseron since 2012, there was no clear flareup in between,  She came back because of worsening bilateral lower extremity pain, shooting pain to both legs, continued low back pain, she has gait difficulty because of the pain, she denies bilateral feet or hands paresthesia, she denies bowel and bladder  incontinence,  She reported a history of right eye bleeding of unsure etiology since July fourth 2013,, was under the care of opathamologist [name], just received intra-epidural injection yesterday, with right conjunctiva small hematoma,  She also complains of 3 months history of left lip spasm, intermittent, intermitted right parietal area headaches,  She is a recovered addict of alcohol, cocaine, prescription medications  UPDATE Jan 20th 2015: She complains of right jaw pain, shooting, sharp, intermittent, worse at night, bring tears to her eyes, hurts when she swallow, talking.  Her low back pain has much improved.  She was awake at night because of right jaw pain. She did not have MRI brain as planned.  She has no gait difficulty, no incontinence.   Review of Systems  Out of a complete 14 system review, the patient complains of only the following symptoms, and all other reviewed systems are negative.  Ringing in ears, speech difficulty.  ALLERGIES: Allergies  Allergen Reactions  . Codeine     HOME MEDICATIONS: Lasix, Levothyroxine  PAST MEDICAL HISTORY: Past Medical History  Diagnosis Date  . MS (multiple sclerosis)   . Encounter for long-term (current) use of other medications   . High blood pressure   . Thyroid disease     PAST SURGICAL HISTORY: Past Surgical History  Procedure Laterality Date  . None      FAMILY HISTORY: History reviewed. No pertinent family history.  SOCIAL HISTORY:  History   Social History  . Marital Status: Married    Spouse Name: [name]    Number of Children: 3  . Years of Education: 15+   Occupational History  . Not on file.  Social History Main Topics  . Smoking status: Never Smoker   . Smokeless tobacco: Never Used  . Alcohol Use: No  . Drug Use: No  . Sexual Activity: Not on file   Other Topics Concern  . Not on file   Social History Narrative   Patient lives at home with her husband Susan Gomez([name]).   Patient has 3  children and husbands has 4 children.   Patient has college education.   Patient is left-handed.   Patient drinks one cup of coffee in the am and two cups at night.          PHYSICAL EXAM   Filed Vitals:   11/22/13 0840  BP: 138/94  Pulse: 58  Height: 5\' 3"  (1.6 m)  Weight: 193 lb (87.544 kg)    Body mass index is 34.2 kg/(m^2).   Generalized: In no acute distress  Neck: Supple, no carotid bruits   Cardiac: Regular rate rhythm  Pulmonary: Clear to auscultation bilaterally  Musculoskeletal: No deformity  Neurological examination  Mentation: tired looking middle age female, alert oriented to time, place, history taking, and causual conversation  Cranial nerve II-XII: Pupils were equal round reactive to light extraocular movements were full, visual field were full on confrontational test. Right conjunctiva small hematoma. Fundi were sharp bilaterally.  Facial sensation and strength were normal. hearing was intact to finger rubbing bilaterally. Uvula tongue midline.  head turning and shoulder shrug and were normal and symmetric.Tongue protrusion into cheek strength was normal.  Motor: normal tone, bulk and strength, gave away weakness at bilateral hip flexion, knee flexion.  Sensory: Intact to fine touch, pinprick, preserved vibratory sensation, and proprioception at toes.  Coordination: Normal finger to nose, heel-to-shin bilaterally there was no truncal ataxia  Gait: Rising up from seated position without assistance, mildly stiff, steady gait, was able to perform tiptoe, heel walking, mild difficulty with tandem.   Romberg signs: Negative  Deep tendon reflexes: Brachioradialis 2/2, biceps 2/2, triceps 2/2, patellar 2/2, Achilles 2/2, plantar responses were flexor bilaterally.   DIAGNOSTIC DATA (LABS, IMAGING, TESTING) - I reviewed patient records, labs, notes, testing and imaging myself where available.  Lab Results  Component Value Date   WBC 6.3 10/31/2007    HGB 13.3 10/31/2007   HCT 39.0 10/31/2007   MCV 90.7 10/31/2007   PLT 302 10/31/2007      Component Value Date/Time   NA 142 10/31/2007 0517   K 3.7 10/31/2007 0517   CL 109 10/31/2007 0517   GLUCOSE 97 10/31/2007 0517   BUN 6 10/31/2007 0517   CREATININE 0.9 10/31/2007 0517    ASSESSMENT AND PLAN   58 years old right-handed African American female, with past medical history of relapsing remitting multiple sclerosis, has been on Betaseron treatment from 58 yo 2012, now presenting with worsening low back pain, bilateral lower extremity shooting pain, spasticity, paresthesia   previous MRI lumbar in 2012 has demonstrated mild degenerative disc disease, no significant foraminal or canal stenosis, she also complains of worsening right jaw sharp shooting pain.  1. we will proceed with repeat MRI of the brain with and without contrast, 2.  she also complains of left facial spasm, increase Neurontin 300 mg 2 tablets 3 times a day, and on nortriptyline 25 mg, titrating to 50 mg every night 3. return to clinic in one month will review her MRI, will make decision about whether we will start her  on long term disease modifying medication or not   4 laboratory evaluation  today, including JC virus antibody, varicella-zoster antibody,.     Levert Feinstein, M.D. Ph.D.  [facility] [address], [address] [place], [place] [address] [phone]

## 2013-11-23 ENCOUNTER — Emergency Department (HOSPITAL_COMMUNITY)
Admission: EM | Admit: 2013-11-23 | Discharge: 2013-11-24 | Disposition: A | Discharge status: Home / Self Care | Payer: BC Managed Care – PPO | Attending: Emergency Medicine | Admitting: Emergency Medicine

## 2013-11-23 ENCOUNTER — Other Ambulatory Visit: Payer: BC Managed Care – PPO

## 2013-11-23 ENCOUNTER — Encounter (HOSPITAL_COMMUNITY): Payer: Self-pay | Admitting: Emergency Medicine

## 2013-11-23 ENCOUNTER — Emergency Department (HOSPITAL_COMMUNITY): Payer: BC Managed Care – PPO

## 2013-11-23 DIAGNOSIS — R700 Elevated erythrocyte sedimentation rate: Secondary | ICD-10-CM | POA: Insufficient documentation

## 2013-11-23 DIAGNOSIS — I10 Essential (primary) hypertension: Secondary | ICD-10-CM | POA: Insufficient documentation

## 2013-11-23 DIAGNOSIS — Z79899 Other long term (current) drug therapy: Secondary | ICD-10-CM | POA: Insufficient documentation

## 2013-11-23 DIAGNOSIS — M2669 Other specified disorders of temporomandibular joint: Secondary | ICD-10-CM | POA: Insufficient documentation

## 2013-11-23 DIAGNOSIS — E079 Disorder of thyroid, unspecified: Secondary | ICD-10-CM | POA: Insufficient documentation

## 2013-11-23 DIAGNOSIS — Z8669 Personal history of other diseases of the nervous system and sense organs: Secondary | ICD-10-CM | POA: Insufficient documentation

## 2013-11-23 LAB — VARICELLA ZOSTER ANTIBODY, IGG: Varicella zoster IgG: >4000 {index}

## 2013-11-23 LAB — COMPREHENSIVE METABOLIC PANEL
ALT: 12 IU/L (ref 0–32)
AST: 14 IU/L (ref 0–40)
Albumin/Globulin Ratio: 1.4 (ref 1.1–2.5)
Albumin: 4.2 g/dL (ref 3.5–5.5)
Alkaline Phosphatase: 31 IU/L — ABNORMAL LOW (ref 39–117)
BUN/Creatinine Ratio: 15 (ref 9–23)
BUN: 14 mg/dL (ref 6–24)
CALCIUM: 9.8 mg/dL (ref 8.7–10.2)
CHLORIDE: 103 mmol/L (ref 97–108)
CO2: 25 mmol/L (ref 18–29)
Creatinine, Ser: 0.91 mg/dL (ref 0.57–1.00)
GFR calc Af Amer: 82 mL/min/{1.73_m2} (ref >59)
GFR, EST NON AFRICAN AMERICAN: 71 mL/min/{1.73_m2} (ref >59)
Globulin, Total: 2.9 g/dL (ref 1.5–4.5)
Glucose: 92 mg/dL (ref 65–99)
POTASSIUM: 3.9 mmol/L (ref 3.5–5.2)
SODIUM: 144 mmol/L (ref 134–144)
Total Bilirubin: 0.6 mg/dL (ref 0.0–1.2)
Total Protein: 7.1 g/dL (ref 6.0–8.5)

## 2013-11-23 LAB — CBC WITH DIFFERENTIAL
BASOS ABS: 0 10*3/uL (ref 0.0–0.2)
Basos: 1 %
EOS ABS: 0.1 10*3/uL (ref 0.0–0.4)
Eos: 1 %
HCT: 36.5 % (ref 34.0–46.6)
Hemoglobin: 11.9 g/dL (ref 11.1–15.9)
IMMATURE GRANS (ABS): 0 10*3/uL (ref 0.0–0.1)
Immature Granulocytes: 0 %
LYMPHS: 52 %
Lymphocytes Absolute: 2.6 10*3/uL (ref 0.7–3.1)
MCH: 29.7 pg (ref 26.6–33.0)
MCHC: 32.6 g/dL (ref 31.5–35.7)
MCV: 91 fL (ref 79–97)
Monocytes Absolute: 0.4 10*3/uL (ref 0.1–0.9)
Monocytes: 8 %
NEUTROS PCT: 38 %
Neutrophils Absolute: 1.9 10*3/uL (ref 1.4–7.0)
Platelets: 232 10*3/uL (ref 150–379)
RBC: 4.01 x10E6/uL (ref 3.77–5.28)
RDW: 14.5 % (ref 12.3–15.4)
WBC: 5 10*3/uL (ref 3.4–10.8)

## 2013-11-23 LAB — POCT I-STAT, CHEM 8
BUN: 12 mg/dL (ref 6–23)
CHLORIDE: 106 meq/L (ref 96–112)
Calcium, Ion: 1.24 mmol/L — ABNORMAL HIGH (ref 1.12–1.23)
Creatinine, Ser: 1 mg/dL (ref 0.50–1.10)
Glucose, Bld: 102 mg/dL — ABNORMAL HIGH (ref 70–99)
HEMATOCRIT: 35 % — AB (ref 36.0–46.0)
HEMOGLOBIN: 11.9 g/dL — AB (ref 12.0–15.0)
POTASSIUM: 4.1 meq/L (ref 3.7–5.3)
SODIUM: 144 meq/L (ref 137–147)
TCO2: 28 mmol/L (ref 0–100)

## 2013-11-23 LAB — CBC WITH DIFFERENTIAL/PLATELET
BASOS PCT: 1 % (ref 0–1)
Basophils Absolute: 0 10*3/uL (ref 0.0–0.1)
EOS ABS: 0.1 10*3/uL (ref 0.0–0.7)
Eosinophils Relative: 1 % (ref 0–5)
HCT: 33.8 % — ABNORMAL LOW (ref 36.0–46.0)
Hemoglobin: 11.6 g/dL — ABNORMAL LOW (ref 12.0–15.0)
Lymphocytes Relative: 56 % — ABNORMAL HIGH (ref 12–46)
Lymphs Abs: 3.3 10*3/uL (ref 0.7–4.0)
MCH: 31 pg (ref 26.0–34.0)
MCHC: 34.3 g/dL (ref 30.0–36.0)
MCV: 90.4 fL (ref 78.0–100.0)
MONOS PCT: 6 % (ref 3–12)
Monocytes Absolute: 0.3 10*3/uL (ref 0.1–1.0)
NEUTROS ABS: 2.2 10*3/uL (ref 1.7–7.7)
Neutrophils Relative %: 36 % — ABNORMAL LOW (ref 43–77)
PLATELETS: 217 10*3/uL (ref 150–400)
RBC: 3.74 MIL/uL — AB (ref 3.87–5.11)
RDW: 13.8 % (ref 11.5–15.5)
WBC: 5.9 10*3/uL (ref 4.0–10.5)

## 2013-11-23 LAB — THYROID PANEL WITH TSH
Free Thyroxine Index: 3.4 (ref 1.2–4.9)
T3 Uptake Ratio: 29 % (ref 24–39)
T4 TOTAL: 11.8 ug/dL (ref 4.5–12.0)
TSH: 0.427 u[IU]/mL — AB (ref 0.450–4.500)

## 2013-11-23 LAB — VITAMIN B12: VITAMIN B 12: 945 pg/mL (ref 211–946)

## 2013-11-23 LAB — VARICELLA ZOSTER ANTIBODY, IGM: Varicella IgM: <0.91 index (ref 0.00–0.90)

## 2013-11-23 LAB — SEDIMENTATION RATE: SED RATE: 45 mm/h — AB (ref 0–22)

## 2013-11-23 MED ORDER — OXYCODONE-ACETAMINOPHEN 5-325 MG PO TABS
1.0000 | ORAL_TABLET | Freq: Once | ORAL | Status: AC
  Administered 2013-11-23: 1 via ORAL
  Filled 2013-11-23: qty 1

## 2013-11-23 NOTE — ED Notes (Signed)
The pt has had rt face pain since last Tuesday.  No known injury.  More pain when she eats.  No pmh of the same

## 2013-11-23 NOTE — ED Provider Notes (Signed)
CSN: [id number]     Arrival date & time 11/23/13  1845 History   None    This chart was scribed for non-physician practitioner, Jaynie Crumble, PA-C, working with Leonette Most B. Bernette Mayers, MD by Arlan Organ, ED Scribe. This patient was seen in room TR10C/TR10C and the patient's care was started at 9:43 PM.   Chief Complaint  Patient presents with  . Jaw Pain   The history is provided by the patient. No language interpreter was used.    HPI Comments: Susan Gomez is a 58 y.o. Female with a PMHx of MS, thyroid disease, and high blood pressure who presents to the Emergency Department complaining of gradual onset, gradually worsening, constant, moderate right sided facial pain that initially started 8 days ago. She denies any known injury or possible trauma. She says eating, swallowing, and talking exacerbates her pain, and denies any alleviating factors. She says she has tried medication prescribed by Dr. Terrace Arabia around 4:30 with no improvement.  She denies any pain when biting down. Denies any visual changes. Pt says she was recently evaluated by Dr. Terrace Arabia for MS, and plans to have an MRI of her head at the end of this month. No other complaints at this time.  Past Medical History  Diagnosis Date  . MS (multiple sclerosis)   . Encounter for long-term (current) use of other medications   . High blood pressure   . Thyroid disease    Past Surgical History  Procedure Laterality Date  . None     No family history on file. History  Substance Use Topics  . Smoking status: Never Smoker   . Smokeless tobacco: Never Used  . Alcohol Use: No   OB History   Grav Para Term Preterm Abortions TAB SAB Ect Mult Living                 Review of Systems  Constitutional: Negative for fever and chills.  HENT:       Jaw and facial pain    Allergies  Codeine  Home Medications   Current Outpatient Rx  Name  Route  Sig  Dispense  Refill  . atorvastatin (LIPITOR) 20 MG tablet   Oral   Take 20 mg  by mouth daily.         . furosemide (LASIX) 20 MG tablet   Oral   Take 20 mg by mouth daily.         Marland Kitchen gabapentin (NEURONTIN) 300 MG capsule   Oral   Take 2 capsules (600 mg total) by mouth 3 (three) times daily.   180 capsule   12   . losartan-hydrochlorothiazide (HYZAAR) 100-12.5 MG per tablet   Oral   Take 1 tablet by mouth daily.          . nortriptyline (PAMELOR) 25 MG capsule   Oral   Take 2 capsules (50 mg total) by mouth at bedtime.   60 capsule   12   . SYNTHROID 112 MCG tablet   Oral   Take 112 mcg by mouth daily.          Triage Vitals: BP 130/78  Pulse 66  Temp(Src) 97.8 F (36.6 C)  Resp 18  Ht 5\' 4"  (1.626 m)  Wt 197 lb (89.359 kg)  BMI 33.80 kg/m2  SpO2 98%  Physical Exam  Nursing note and vitals reviewed. Constitutional: She is oriented to person, place, and time. She appears well-developed and well-nourished.  Upper jaw is edentulous  HENT:  Head: Normocephalic and atraumatic.  Pharynx normal  Eyes: EOM are normal.  Neck: Normal range of motion.  Cardiovascular: Normal rate and regular rhythm.   Pulmonary/Chest: Effort normal.  Musculoskeletal: Normal range of motion. She exhibits tenderness.  Tenderness to right TMJ Tenderness to right temple Tenderness to right submandibular gland Pain with ROM to the jaw  Neurological: She is alert and oriented to person, place, and time.  Skin: Skin is warm and dry.  Psychiatric: She has a normal mood and affect. Her behavior is normal.    ED Course  Procedures (including critical care time)  DIAGNOSTIC STUDIES: Oxygen Saturation is 98% on RA, Normal by my interpretation.    COORDINATION OF CARE: 9:46 PM- Will order blood panel. Discussed treatment plan with pt at bedside and pt agreed to plan.     Labs Review Labs Reviewed  SEDIMENTATION RATE - Abnormal; Notable for the following:    Sed Rate 45 (*)    All other components within normal limits  CBC WITH DIFFERENTIAL - Abnormal;  Notable for the following:    RBC 3.74 (*)    Hemoglobin 11.6 (*)    HCT 33.8 (*)    Neutrophils Relative % 36 (*)    Lymphocytes Relative 56 (*)    All other components within normal limits  POCT I-STAT, CHEM 8 - Abnormal; Notable for the following:    Glucose, Bld 102 (*)    Calcium, Ion 1.24 (*)    Hemoglobin 11.9 (*)    HCT 35.0 (*)    All other components within normal limits   Imaging Review No results found.  EKG Interpretation   None       MDM   1. TMJ inflammation   2. Elevated sed rate     Patient with right facial pain for several months. She has already seen her Dr. or neurologist for the same. She is here and tearful because patient is not getting better. Sedimentation rate and labs and x-ray obtained. Her sedimentation rate is mildly elevated at 45. I do not think she has for arthritis based on a sedimentation rate. She does have history of the masses could explain her elevated sedimentation rate. Her pain is clearly reproduced with palpation of TMJ joint and temporal muscles, as well as movement of the jaw. Patient may have trigeminal neuralgia versus temporal arthritis. [facility] which helped her pain. She is more comfortable at this time. Will discharge her home with Norco for pain and she is to followup with her primary care Dr. neurologist as soon as possible.  Filed Vitals:   11/23/13 1852  BP: 130/78  Pulse: 66  Temp: 97.8 F (36.6 C)  Resp: 18  Height: 5\' 4"  (1.626 m)  Weight: 197 lb (89.359 kg)  SpO2: 98%   I personally performed the services described in this documentation, which was scribed in my presence. The recorded information has been reviewed and is accurate.      Lottie Musselatyana A Kahla Risdon, PA-C 11/24/13 0017

## 2013-11-24 ENCOUNTER — Telehealth: Payer: Self-pay | Admitting: Neurology

## 2013-11-24 MED ORDER — HYDROCODONE-ACETAMINOPHEN 5-325 MG PO TABS: 1.0000 | ORAL_TABLET | Freq: Four times a day (QID) | ORAL | Status: DC | PRN

## 2013-11-24 NOTE — ED Provider Notes (Signed)
Medical screening examination/treatment/procedure(s) were performed by non-physician practitioner and as supervising physician I was immediately available for consultation/collaboration.  EKG Interpretation   None         Charles B. Sheldon, MD 11/24/13 0858 

## 2013-11-24 NOTE — Telephone Encounter (Addendum)
Patient has an appt to see Dr Terrace Arabia 12/23/13

## 2013-11-24 NOTE — Discharge Instructions (Signed)
Norco for severe pain. Follow up with your neurologist and primary care doctor.

## 2013-11-24 NOTE — Telephone Encounter (Signed)
I have just saw her in Jan 20th 2015, MRI brain is planned, will review treatment plan on follow up visit.

## 2013-11-24 NOTE — Telephone Encounter (Signed)
Patient calling to state that she was in the ER yesterday and was advised to call her neurologist. Patient states her face is hurting and the pain is getting worse. Please call patient and advise.

## 2013-11-30 ENCOUNTER — Ambulatory Visit: Payer: BC Managed Care – PPO | Admitting: Neurology

## 2013-12-02 ENCOUNTER — Ambulatory Visit (HOSPITAL_COMMUNITY)
Admission: RE | Admit: 2013-12-02 | Discharge: 2013-12-02 | Disposition: A | Discharge status: Home / Self Care | Payer: BC Managed Care – PPO | Source: Ambulatory Visit | Attending: Neurology | Admitting: Neurology

## 2013-12-02 DIAGNOSIS — R51 Headache: Secondary | ICD-10-CM | POA: Insufficient documentation

## 2013-12-02 DIAGNOSIS — I10 Essential (primary) hypertension: Secondary | ICD-10-CM | POA: Insufficient documentation

## 2013-12-02 DIAGNOSIS — G35 Multiple sclerosis: Secondary | ICD-10-CM | POA: Insufficient documentation

## 2013-12-02 MED ORDER — GADOBENATE DIMEGLUMINE 529 MG/ML IV SOLN
20.0000 mL | Freq: Once | INTRAVENOUS | Status: AC | PRN
  Administered 2013-12-02: 18 mL via INTRAVENOUS

## 2013-12-02 NOTE — Progress Notes (Signed)
Quick Note:  Will review on her follow up ______

## 2013-12-23 ENCOUNTER — Encounter: Payer: Self-pay | Admitting: Neurology

## 2013-12-23 ENCOUNTER — Ambulatory Visit (INDEPENDENT_AMBULATORY_CARE_PROVIDER_SITE_OTHER): Payer: BC Managed Care – PPO | Admitting: Neurology

## 2013-12-23 VITALS — BP 123/86 | HR 73 | Ht 63.0 in | Wt 192.0 lb

## 2013-12-23 DIAGNOSIS — G35 Multiple sclerosis: Secondary | ICD-10-CM

## 2013-12-23 DIAGNOSIS — G35D Multiple sclerosis, unspecified: Secondary | ICD-10-CM

## 2013-12-23 NOTE — Progress Notes (Signed)
_  [facility]  PATIENT: Susan Gomez DOB: 1956/09/22  HISTORICAL  Ms. Susan Gomez is a 58 year-old black female, referred by her primary care physician [name], to continue followup for her multiple sclerosis, and recent worsening of low back pain, last clinical visit was in 2011   She is a prior patient of [name] since 2005. with diagnosis of relapsing remitting multiple sclerosis.  The inital symptoms was facial asymetry, later also developed transient right leg weakness. The diagnosis is confirmed by abnormal MRI brain, cervical, and lumbar puncture showed  more than 2 oligoclonal bands.   She has been on Betaseron every other day since 2005,. She denies any new onset symptoms or clear clinical flareups ever since, such as weakness, sensory changes, blurred vision or double vision, speech or swallowing problems, bowel or bladder difficulties, spasticity.   She complains of right side low back pain since 2000, right hip pain, shooting pain to right lateral thigh and leg,  failed to improve by physical therapy, and stretching exercise. she denies incontinence  Following her last visit, she had  MRI brain in 2012 showed subtle confluent periventricular and subcortical white matter hyperintensitioes which are comaptible with demyelinating disease but appear unchanged from MRI 09/11/06. No enhancing lesions, T 1 black holes or significant atrophy is seen.  MRI scan of the lumbar spine shows only minor disc signal and facet degenerative changes without significant compression.  She has lost followup because of the financial burden, She has been out of Betaseron since 2012, there was no clear flareup in between,  She came back because of worsening bilateral lower extremity pain, shooting pain to both legs, continued low back pain, she has gait difficulty because of the pain, she denies bilateral feet or hands paresthesia, she denies bowel and bladder  incontinence,  She reported a history of right eye bleeding of unsure etiology since July fourth 2013,, was under the care of opathamologist [name], just received intra-epidural injection yesterday, with right conjunctiva small hematoma,  She also complains of 3 months history of left lip spasm, intermittent, intermitted right parietal area headaches,  She is a recovered addict of alcohol, cocaine, prescription medications  UPDATE Jan 20th 2015: She complains of right jaw pain, shooting, sharp, intermittent, worse at night, bring tears to her eyes, hurts when she swallow, talking.  Her low back pain has much improved.  She was awake at night because of right jaw pain. She did not have MRI brain as planned.  She has no gait difficulty, no incontinence.   UPDATE Feb 20th 2015: She presented to emergency room for right jaw pain, was getting prescription of Percocet in Nov 23 2013. Now her right jaw pain has much improved, she continued to have mild to moderate.lower back pain, she denies significant gait difficulty   We have reviewed MRI of brain in January 2015, also compared with previous MRI in 2007, there is mild increase in supratentorium lesion load, but there was no enhancing lesions,  JC virus ab was positive, with titer 0.2 2, January 20th 2015.   Review of Systems  Out of a complete 14 system review, the patient complains of only the following symptoms, and all other reviewed systems are negative.  Restless leg, leg pain, headaches, speech difficulty   ALLERGIES: Allergies  Allergen Reactions  . Codeine Other (See Comments)    Recovering addict - wishes not to consume if possible    HOME MEDICATIONS: Lasix, Levothyroxine  PAST  MEDICAL HISTORY: Past Medical History  Diagnosis Date  . MS (multiple sclerosis)   . Encounter for long-term (current) use of other medications   . High blood pressure   . Thyroid disease     PAST SURGICAL HISTORY: Past Surgical  History  Procedure Laterality Date  . Vaginal hysterectomy    . Left thumb    . Knee surgery      FAMILY HISTORY: No family history on file.  SOCIAL HISTORY:  History   Social History  . Marital Status: Married    Spouse Name: [name]    Number of Children: 3  . Years of Education: 15+   Occupational History  .      Disabled   Social History Main Topics  . Smoking status: Never Smoker   . Smokeless tobacco: Never Used  . Alcohol Use: No  . Drug Use: No  . Sexual Activity: Not on file   Other Topics Concern  . Not on file   Social History Narrative   Patient lives at home with her husband Susan Gomez).   Patient has 3 children and husbands has 4 children.   Patient has college education.   Patient is left-handed.   Patient drinks one cup of coffee in the am and two cups at night.          PHYSICAL EXAM   Filed Vitals:   12/23/13 1018  BP: 123/86  Pulse: 73  Height: 5\' 3"  (1.6 m)  Weight: 192 lb (87.091 kg)    Body mass index is 34.02 kg/(m^2).   Generalized: In no acute distress  Neck: Supple, no carotid bruits   Cardiac: Regular rate rhythm  Pulmonary: Clear to auscultation bilaterally  Musculoskeletal: No deformity  Neurological examination  Mentation: tired looking middle age female, alert oriented to time, place, history taking, and causual conversation  Cranial nerve II-XII: Pupils were equal round reactive to light extraocular movements were full, visual field were full on confrontational test. Right conjunctiva small hematoma. Fundi were sharp bilaterally.  Facial sensation and strength were normal. hearing was intact to finger rubbing bilaterally. Uvula tongue midline.  head turning and shoulder shrug and were normal and symmetric.Tongue protrusion into cheek strength was normal.  Motor: normal tone, bulk and strength, gave away weakness at bilateral hip flexion, knee flexion.There was no significant bilateral lower extremity spasticity     Sensory: Intact to fine touch, pinprick, preserved vibratory sensation, and proprioception at toes.  Coordination: Normal finger to nose, heel-to-shin bilaterally there was no truncal ataxia  Gait: Rising up from seated position without assistance, mildly stiff, steady gait, was able to perform tiptoe, heel walking, mild difficulty with tandem.   Romberg signs: Negative  Deep tendon reflexes: Brachioradialis 2/2, biceps 2/2, triceps 2/2, patellar 2/2, Achilles 2/2, plantar responses were flexor bilaterally.   DIAGNOSTIC DATA (LABS, IMAGING, TESTING) - I reviewed patient records, labs, notes, testing and imaging myself where available.  Lab Results  Component Value Date   WBC 5.9 11/23/2013   HGB 11.9* 11/23/2013   HCT 35.0* 11/23/2013   MCV 90.4 11/23/2013   PLT 217 11/23/2013      Component Value Date/Time   NA 144 11/23/2013 2228   NA 144 11/22/2013 0922   K 4.1 11/23/2013 2228   CL 106 11/23/2013 2228   CO2 25 11/22/2013 0922   GLUCOSE 102* 11/23/2013 2228   GLUCOSE 92 11/22/2013 0922   BUN 12 11/23/2013 2228   BUN 14 11/22/2013 0922   CREATININE 1.00  11/23/2013 2228   CALCIUM 9.8 11/22/2013 0922   PROT 7.1 11/22/2013 0922   AST 14 11/22/2013 0922   ALT 12 11/22/2013 0922   ALKPHOS 31* 11/22/2013 0922   BILITOT 0.6 11/22/2013 0922   GFRNONAA 71 11/22/2013 0922   GFRAA 82 11/22/2013 0922    ASSESSMENT AND PLAN   58 years old right-handed African American female, with past medical history of relapsing remitting multiple sclerosis, has been on Betaseron treatment from 58 yo 2012, now presenting with worsening low back pain, bilateral lower extremity shooting pain, paresthesia,    previous MRI lumbar in 2012 has demonstrated mild degenerative disc disease, no significant foraminal or canal stenosis, she also complains of worsening right jaw sharp shooting pain.  1. I have discussed her was to treatment option, we decided to proceed with plegridy. 2. RTC In 3-4 months   Levert FeinsteinYijun Maxim Bedel,  M.D. Ph.D.  [facility] [address], [address] [place], [place] [id number] [phone]

## 2013-12-23 NOTE — Patient Instructions (Signed)
[email or web address]

## 2014-01-05 ENCOUNTER — Ambulatory Visit: Payer: Self-pay | Admitting: Neurology

## 2014-01-09 ENCOUNTER — Telehealth: Payer: Self-pay | Admitting: Neurology

## 2014-01-09 NOTE — Telephone Encounter (Signed)
Pt called about the medicine Dr. Terrace Arabia is wanting to put her on, Plegridy.  She stated that this needs to be authorized by Dr. Terrace Arabia but she would need to wait until 01-11-14 to do the authorization.  The number is [phone]. They are going to be assisting the patient with the prescription and that's why they asked for the authorization to be after 01-11-14.  If there are any questions, please call.  Thank you

## 2014-02-15 ENCOUNTER — Telehealth: Payer: Self-pay | Admitting: Neurology

## 2014-02-17 NOTE — Telephone Encounter (Signed)
Agree Sandy's recommendation February 17 2014, continue followup, will address her questions in her followup visit

## 2014-02-17 NOTE — Telephone Encounter (Signed)
Pt calling having continued muscle fatigue (walking from car to house), and also paresthesia (sharp shooting pains both legs >thigh then calf).  Has taken 3rd injection of plegridy last Wednesday.  Her gabapentin has caused her drowsiness so she has been inconsistent with taking this.  I told her that some neuro drugs need to be taken and pts will acclimate to the drug over time.  I was not sure if gabapentin was one of these.  She may be able to take at night since she is drowsy and may also help her sleep.  Will forward to Dr. Terrace Arabia and see what she recommends.

## 2014-02-17 NOTE — Telephone Encounter (Signed)
Patient calling and is very upset, states that she has been waiting for a call back. Patient states "if she doesn't have time for me, have her send me to someone else." Please call patient and advise.

## 2014-03-31 ENCOUNTER — Telehealth: Payer: Self-pay | Admitting: Neurology

## 2014-03-31 NOTE — Telephone Encounter (Addendum)
Patient came to office to show bumbs/breakouts on her skin, she spoke to the nurse from the company, who advised she come see [place].  They start as a little bumbs then get bigger and itchy, look almost like a large insect bite.  She took her Plegridy on Wednesday and on Thursday the breakout started and she is continuing to have the breakouts.  This also happened two weeks when she injected the medication.    She would like to know what needs to be done and also what she can use for the itching.  She can be reached at [phone].  I have called Chanley, she started on Plegridy since March, 2015, she did receive nurse training once, she had total of five injections, last injection was in May 27th 2015, 8:30Pm, next day in May 28th, she woke up noticed rashes at her face, arm, body, legs, she put injection to her right thigh this time. The injection site is fine,   4th injection was in May 13rd, 2015, she also had rash too, itchings, and buring lasting till next injection.   She has tried [place] nurse, was advised to call our office.  Annabelle Harman, I have asked her to come in June 3rd 9am appt, please put her on schedule.

## 2014-03-31 NOTE — Telephone Encounter (Signed)
Patient called and stated injection medication has her breaking out in knots on her skin, they're itchy and sore.  Please call and advise

## 2014-04-05 ENCOUNTER — Ambulatory Visit (INDEPENDENT_AMBULATORY_CARE_PROVIDER_SITE_OTHER): Payer: BC Managed Care – PPO | Admitting: Neurology

## 2014-04-05 ENCOUNTER — Encounter: Payer: Self-pay | Admitting: Neurology

## 2014-04-05 VITALS — BP 143/98 | HR 68 | Ht 63.0 in | Wt 191.0 lb

## 2014-04-05 DIAGNOSIS — G35 Multiple sclerosis: Secondary | ICD-10-CM

## 2014-04-05 NOTE — Patient Instructions (Signed)
Take Claritin as needed,  Benadryl 25 mg 1-2 tablets every night as needed,  Applying Benadryl cream, hydrocortisone cream to rash as needed,  call back office in 2 weeks, if you have persistent symptoms,

## 2014-04-05 NOTE — Progress Notes (Signed)
_  [facility]  PATIENT: Susan Gomez DOB: 10-19-1956  HISTORICAL  Susan Gomez is a 58 year-old black female, referred by her primary care physician Susan Gomez, to continue followup for her multiple sclerosis, and recent worsening of low back pain, last clinical visit was in 2011   She is a prior patient of Susan Gomez since 2005. with diagnosis of relapsing remitting multiple sclerosis.  The inital symptoms was facial asymetry, later also developed transient right leg weakness. The diagnosis is confirmed by abnormal MRI brain, cervical, and lumbar puncture showed  more than 2 oligoclonal bands.   She has been on Betaseron every other day since 2005,. She denies any new onset symptoms or clear clinical flareups ever since, such as weakness, sensory changes, blurred vision or double vision, speech or swallowing problems, bowel or bladder difficulties, spasticity.   She complains of right side low back pain since 2000, right hip pain, shooting pain to right lateral thigh and leg,  failed to improve by physical therapy, and stretching exercise. she denies incontinence  Following her last visit, she had  MRI brain in 2012 showed subtle confluent periventricular and subcortical white matter hyperintensitioes which are comaptible with demyelinating disease but appear unchanged from MRI 09/11/06. No enhancing lesions, T 1 black holes or significant atrophy is seen.  MRI scan of the lumbar spine shows only minor disc signal and facet degenerative changes without significant compression.  She has lost followup because of the financial burden, She has been out of Betaseron since 2012, there was no clear flareup in between,  She came back because of worsening bilateral lower extremity pain, shooting pain to both legs, continued low back pain, she has gait difficulty because of the pain, she denies bilateral feet or hands paresthesia, she denies bowel and bladder  incontinence,  She reported a history of right eye bleeding of unsure etiology since July fourth 2013, was under the care of opathamologist Susan Gomez, just received intra-epidural injection yesterday, with right conjunctiva small hematoma,  She also complains of 3 months history of left lip spasm, intermittent, intermitted right parietal area headaches,  She is a recovered addict of alcohol, cocaine, prescription medications.  UPDATE Jan 20th 2015: She complains of right jaw pain, shooting, sharp, intermittent, worse at night, bring tears to her eyes, hurts when she swallow, talking.  Her low back pain has much improved.  She was awake at night because of right jaw pain. She did not have MRI brain as planned.  She has no gait difficulty, no incontinence.   UPDATE Feb 20th 2015: She presented to emergency room for right jaw pain, was getting prescription of Percocet in Nov 23 2013. Now her right jaw pain has much improved, she continued to have mild to moderate.lower back pain, she denies significant gait difficulty   We have reviewed MRI of brain in January 2015, also compared with previous MRI in 2007, there is mild increase in supratentorium lesion load, but there was no enhancing lesions,  JC virus ab was positive, with titer 0.22, January 20th 2015.  UPDATE June 3rd 2015: She started on Plegridy since March 2015, she had total of 4 injections, last injection was May 27th, before that was May 13rd, she was trained by Susan Gomez at home with her first injection.  She has heart palpitation, flu-like illness, lasting one day, during 1st, 2nd injection, aleve was helping, during 3rd, 4th injections, she has more reactions, large size skin hives, sore, itching,  involving her back, upper and lower extremity, dorsum hand.  She has taken aleve, benadryl, Claritin, and tried different creams, Benadryl, hydrocortisone creams, none of it stop her from itchings.   Review of Systems  Out  of a complete 14 system review, the patient complains of only the following symptoms, and all other reviewed systems are negative.  Restless leg, leg pain, headaches, speech difficulty   ALLERGIES: Allergies  Allergen Reactions  . Codeine Other (See Comments)    Recovering addict - wishes not to consume if possible    HOME MEDICATIONS: Lasix, Levothyroxine  PAST MEDICAL HISTORY: Past Medical History  Diagnosis Date  . MS (multiple sclerosis)   . Encounter for long-term (current) use of other medications   . High blood pressure   . Thyroid disease     PAST SURGICAL HISTORY: Past Surgical History  Procedure Laterality Date  . Vaginal hysterectomy    . Left thumb    . Knee surgery      FAMILY HISTORY: History reviewed. No pertinent family history.  SOCIAL HISTORY:  History   Social History  . Marital Status: Married    Spouse Name: Susan Gomez    Number of Children: 3  . Years of Education: 15+   Occupational History  .      Disabled   Social History Main Topics  . Smoking status: Never Smoker   . Smokeless tobacco: Never Used  . Alcohol Use: No  . Drug Use: No  . Sexual Activity: Not on file   Other Topics Concern  . Not on file   Social History Narrative   Patient lives at home with her husband Susan Gomez(Susan Gomez).   Patient has 3 children and husbands has 4 children.   Patient has college education.   Patient is left-handed.   Patient drinks one cup of coffee in the am and two cups at night.          PHYSICAL EXAM   Filed Vitals:   04/05/14 0857  BP: 143/98  Pulse: 68  Height: 5\' 3"  (1.6 m)  Weight: 191 lb (86.637 kg)    Body mass index is 33.84 kg/(m^2).   Generalized: In no acute distress  Neck: Supple, no carotid bruits   Cardiac: Regular rate rhythm  Pulmonary: Clear to auscultation bilaterally  Musculoskeletal: No deformity  Neurological examination  Mentation: tired looking middle age female, alert oriented to time, place, history  taking, and causual conversation  Cranial nerve II-XII: Pupils were equal round reactive to light extraocular movements were full, visual field were full on confrontational test. Right conjunctiva small hematoma. Fundi were sharp bilaterally.  Facial sensation and strength were normal. hearing was intact to finger rubbing bilaterally. Uvula tongue midline.  head turning and shoulder shrug and were normal and symmetric.Tongue protrusion into cheek strength was normal.  Motor: normal tone, bulk and strength, gave away weakness at bilateral hip flexion, knee flexion.There was no significant bilateral lower extremity spasticity    Sensory: Intact to fine touch, pinprick, preserved vibratory sensation, and proprioception at toes.  Coordination: Normal finger to nose, heel-to-shin bilaterally there was no truncal ataxia  Gait: Rising up from seated position without assistance, mildly stiff, steady gait, was able to perform tiptoe, heel walking, mild difficulty with tandem.   Romberg signs: Negative  Deep tendon reflexes: Brachioradialis 2/2, biceps 2/2, triceps 2/2, patellar 2/2, Achilles 2/2, plantar responses were flexor bilaterally.   DIAGNOSTIC DATA (LABS, IMAGING, TESTING) - I reviewed patient records, labs, notes, testing and imaging  myself where available.  Lab Results  Component Value Date   WBC 5.9 11/23/2013   HGB 11.9* 11/23/2013   HCT 35.0* 11/23/2013   MCV 90.4 11/23/2013   PLT 217 11/23/2013      Component Value Date/Time   NA 144 11/23/2013 2228   NA 144 11/22/2013 0922   K 4.1 11/23/2013 2228   CL 106 11/23/2013 2228   CO2 25 11/22/2013 0922   GLUCOSE 102* 11/23/2013 2228   GLUCOSE 92 11/22/2013 0922   BUN 12 11/23/2013 2228   BUN 14 11/22/2013 0922   CREATININE 1.00 11/23/2013 2228   CALCIUM 9.8 11/22/2013 0922   PROT 7.1 11/22/2013 0922   AST 14 11/22/2013 0922   ALT 12 11/22/2013 0922   ALKPHOS 31* 11/22/2013 0922   BILITOT 0.6 11/22/2013 0922   GFRNONAA 71 11/22/2013 0922    GFRAA 82 11/22/2013 0922    ASSESSMENT AND PLAN   58 years old right-handed African American female, with past medical history of relapsing remitting multiple sclerosis, has been on Betaseron treatment from 2005 to 2012, now presenting with worsening low back pain, bilateral lower extremity shooting pain, paresthesia,    previous MRI lumbar in 2012 has demonstrated mild degenerative disc disease, no significant foraminal or canal stenosis, she also complains of worsening right jaw sharp shooting pain.  MRI of brain in January 2015, also compared with previous MRI in 2007, there is mild increase in supratentorium lesion load, but there was no enhancing lesions,  I have discussed with her treatment options, she does not want to go on with Plegridy, which has caused unbearable itching hives.  She wants to take a break from any treatment, she has no significant difficulty, mild lesion load MRI scan, will hold off treatment at this point, return to clinic in 6 months,  Levert Feinstein, M.D. Ph.D.  [facility] [address] [place], [place] [address] [phone]

## 2014-04-24 ENCOUNTER — Ambulatory Visit: Payer: BC Managed Care – PPO | Admitting: Neurology

## 2014-06-08 NOTE — Telephone Encounter (Signed)
Noted  

## 2014-06-09 ENCOUNTER — Encounter: Payer: Self-pay | Admitting: Neurology

## 2014-06-09 ENCOUNTER — Telehealth: Payer: Self-pay | Admitting: Neurology

## 2014-06-09 NOTE — Telephone Encounter (Signed)
Called patient and left her a message asking to give her a call back.

## 2014-06-09 NOTE — Telephone Encounter (Signed)
Patient returning Dana's call.  Please call back @ [phone].  Thanks

## 2014-06-09 NOTE — Telephone Encounter (Signed)
Patient Susan Gomez DOB [birth year] is calling--patient states she has had episodes of her lip turning to the side since July--please call.

## 2014-06-13 NOTE — Telephone Encounter (Signed)
Recommend follow up visit to evaluate and treat; may schedule with NP or Dr. Terrace Arabia when she returns. If she cannot afford to come in for an office visit here, then she will need to go to PCP. -VRP

## 2014-06-13 NOTE — Telephone Encounter (Signed)
Called patient back and spoke with her about getting appt. With NP or seeing pcp. Patient states she was fine and would just wait for Dr.Yan to return because she knows her best. Stated again to patient Dr.Yan was out of the office until 06-22-2014 if she had any more problems before then please call us back or PCP or ER.

## 2014-06-13 NOTE — Telephone Encounter (Signed)
Called and spoke to patient and she states her lip is turning to the left and  its hurting and her left leg is giving out on her. Patient is not taking any medication at this time. Patient also states she can't afford to come in for a office visit.  Patient is aware Dr.Yan is out of the office until 06-22-2014.

## 2014-10-06 ENCOUNTER — Ambulatory Visit: Payer: BC Managed Care – PPO | Admitting: Neurology

## 2014-10-09 ENCOUNTER — Encounter: Payer: Self-pay | Admitting: Neurology

## 2014-10-09 ENCOUNTER — Ambulatory Visit (INDEPENDENT_AMBULATORY_CARE_PROVIDER_SITE_OTHER): Payer: BC Managed Care – PPO | Admitting: Neurology

## 2014-10-09 VITALS — BP 142/88 | HR 68 | Ht 63.0 in | Wt 196.0 lb

## 2014-10-09 DIAGNOSIS — G35 Multiple sclerosis: Secondary | ICD-10-CM

## 2014-10-09 DIAGNOSIS — M545 Low back pain, unspecified: Secondary | ICD-10-CM

## 2014-10-09 MED ORDER — GABAPENTIN 300 MG PO CAPS: 600.0000 mg | ORAL_CAPSULE | Freq: Three times a day (TID) | ORAL | Status: DC

## 2014-10-09 NOTE — Progress Notes (Signed)
_  [facility]  PATIENT: Susan Gomez DOB: [birth year]  HISTORICAL  Susan Gomez is a 58 year-old black female, referred by her primary care physician Dr. Johny BlamerWilliam Harris, to continue followup for her multiple sclerosis, and recent worsening of low back pain,    She is a prior patient of Dr. Orlin HildingWeymann since 2005. with diagnosis of relapsing remitting multiple sclerosis.  The inital symptoms was facial asymetry, later also developed transient right leg weakness. The diagnosis is confirmed by abnormal MRI brain, cervical, and lumbar puncture showed  more than 2 oligoclonal bands.   She has been on Betaseron every other day since 2005,. She denies any new onset symptoms or clear clinical flareups ever since, such as weakness, sensory changes, blurred vision or double vision, speech or swallowing problems, bowel or bladder difficulties, spasticity.   She complains of right side low back pain since 2000, right hip pain, shooting pain to right lateral thigh and leg,  failed to improve by physical therapy, and stretching exercise. she denies incontinence  MRI brain in 2012 showed subtle confluent periventricular and subcortical white matter hyperintensitioes which are comaptible with demyelinating disease but appear unchanged from MRI 09/11/06. No enhancing lesions, T 1 black holes or significant atrophy is seen.  MRI scan of the lumbar spine shows only minor disc signal and facet degenerative changes without significant compression.  She has lost followup because of the financial burden, She has been out of Betaseron since 2012, there was no clear flareup in between,  She came back because of worsening bilateral lower extremity pain, shooting pain to both legs, continued low back pain, she has gait difficulty because of the pain, she denies bilateral feet or hands paresthesia, she denies bowel and bladder incontinence,  She reported a history of right eye bleeding of unsure  etiology in July 4th 2013, was under the care of opathamologist Dr. Harrie JeansAppenzella, just received intra-epidural injection yesterday, with right conjunctiva small hematoma,  She also complains of 3 months history of left lip spasm, intermittent, intermitted right parietal area headaches,  She is a recovered addict of alcohol, cocaine, prescription medications.  UPDATE Jan 20th 2015: She complains of right jaw pain, shooting, sharp, intermittent, worse at night, bring tears to her eyes, hurts when she swallow, talking.  Her low back pain has much improved.  She was awake at night because of right jaw pain. She did not have MRI brain as planned.  She has no gait difficulty, no incontinence.   UPDATE Feb 20th 2015: She presented to emergency room for right jaw pain, was getting prescription of Percocet in Nov 23 2013. Now her right jaw pain has much improved, she continued to have mild to moderate.lower back pain, she denies significant gait difficulty   MRI of brain in January 2015, also compared with previous MRI in 2007, there is mild increase in supratentorium lesion load, but there was no enhancing lesions,  JC virus ab was positive, with titer 0.22, January 20th 2015.  UPDATE June 3rd 2015: She started on Plegridy since March 2015, she had total of 4 injections, last injection was May 27th, before that was May 13rd, she was trained by [employer]le[employer] at home with her first injection.  She has heart palpitation, flu-like illness, lasting one day, during 1st, 2nd injection, aleve was helping, during 3rd, 4th injections, she has more reactions, large size skin hives, sore, itching, involving her back, upper and lower extremity, dorsum hand.  She has taken aleve, benadryl,  Claritin, and tried different creams, Benadryl, hydrocortisone creams, none of it stop her from itchings.  UPDATE Dec 7th 2015: Her low back pain has much improved, only has mild midline low back pain, she denies shooting pain  from her back to her lower extremity, she denies gait difficulty, she denies bowel and bladder incontinence,  she is not on any immunomodulation therapy at this point    Review of Systems  Out of a complete 14 system review, the patient complains of only the following symptoms, and all other reviewed systems are negative.   as above   ALLERGIES: Allergies  Allergen Reactions  . Codeine Other (See Comments)    Recovering addict - wishes not to consume if possible    HOME MEDICATIONS: Lasix, Levothyroxine  PAST MEDICAL HISTORY: Past Medical History  Diagnosis Date  . MS (multiple sclerosis)   . Encounter for long-term (current) use of other medications   . High blood pressure   . Thyroid disease     PAST SURGICAL HISTORY: Past Surgical History  Procedure Laterality Date  . Vaginal hysterectomy    . Left thumb    . Knee surgery      FAMILY HISTORY: Family History  Problem Relation Age of Onset  . Family history unknown: Yes    SOCIAL HISTORY:  History   Social History  . Marital Status: Married    Spouse Name: [name]    Number of Children: 3  . Years of Education: 15+   Occupational History  .      Disabled   Social History Main Topics  . Smoking status: Never Smoker   . Smokeless tobacco: Never Used  . Alcohol Use: No  . Drug Use: No  . Sexual Activity: Not on file   Other Topics Concern  . Not on file   Social History Narrative   Patient lives at home with her husband [name]).   Patient has 3 children and husbands has 4 children.   Patient has college education.   Patient is left-handed.   Patient drinks one cup of coffee in the am and two cups at night.          PHYSICAL EXAM   Filed Vitals:   10/09/14 1337  BP: 142/88  Pulse: 68  Height: 5\' 3"  (1.6 m)  Weight: 196 lb (88.905 kg)    Body mass index is 34.73 kg/(m^2).   Generalized: In no acute distress  Neck: Supple, no carotid bruits   Cardiac: Regular rate  rhythm  Pulmonary: Clear to auscultation bilaterally  Musculoskeletal: No deformity  Neurological examination  Mentation: tired looking middle age female, alert oriented to time, place, history taking, and causual conversation  Cranial nerve II-XII: Pupils were equal round reactive to light extraocular movements were full, visual field were full on confrontational test. Right conjunctiva small hematoma. Fundi were sharp bilaterally.  Facial sensation and strength were normal. hearing was intact to finger rubbing bilaterally. Uvula tongue midline.  head turning and shoulder shrug and were normal and symmetric.Tongue protrusion into cheek strength was normal.  Motor: normal tone, bulk and strength, gave away weakness at bilateral hip flexion, knee flexion.There was no significant bilateral lower extremity spasticity    Sensory: Intact to fine touch, pinprick, preserved vibratory sensation, and proprioception at toes.  Coordination: Normal finger to nose, heel-to-shin bilaterally there was no truncal ataxia  Gait: Rising up from seated position without assistance, steady gait, was able to perform tiptoe, heel walking, mild difficulty with tandem.  Romberg signs: Negative  Deep tendon reflexes: Brachioradialis 2/2, biceps 2/2, triceps 2/2, patellar 2/2, Achilles 2/2, plantar responses were flexor bilaterally.   DIAGNOSTIC DATA (LABS, IMAGING, TESTING) - I reviewed patient records, labs, notes, testing and imaging myself where available.  Lab Results  Component Value Date   WBC 5.9 11/23/2013   HGB 11.9* 11/23/2013   HCT 35.0* 11/23/2013   MCV 90.4 11/23/2013   PLT 217 11/23/2013      Component Value Date/Time   NA 144 11/23/2013 2228   NA 144 11/22/2013 0922   K 4.1 11/23/2013 2228   CL 106 11/23/2013 2228   CO2 25 11/22/2013 0922   GLUCOSE 102* 11/23/2013 2228   GLUCOSE 92 11/22/2013 0922   BUN 12 11/23/2013 2228   BUN 14 11/22/2013 0922   CREATININE 1.00 11/23/2013 2228    CALCIUM 9.8 11/22/2013 0922   PROT 7.1 11/22/2013 0922   AST 14 11/22/2013 0922   ALT 12 11/22/2013 0922   ALKPHOS 31* 11/22/2013 0922   BILITOT 0.6 11/22/2013 0922   GFRNONAA 71 11/22/2013 0922   GFRAA 82 11/22/2013 0922    ASSESSMENT AND PLAN   58 years old right-handed African American female, with past medical history of relapsing remitting multiple sclerosis, has been on Betaseron treatment from 2005 to 2012, now presenting with worsening low back pain, bilateral lower extremity shooting pain, paresthesia,    She had a short trial of Plegridy, complains of injection side reaction, she is not on any medications now.  Previous MRI lumbar in 2012 has demonstrated mild degenerative disc disease, no significant foraminal or canal stenosis, she also complains of worsening right jaw sharp shooting pain.  We have reviewed MRI of brain in January 2015, also compared with previous MRI in 2007, there is mild increase in supratentorium lesion load, but there was no enhancing lesions,  Her symptoms has been stable, there is no clinical flareup, she continue have mild midline low back pain, most likely due to musculoskeletal etiology  Will continue observe her symptoms, she is not currently on any immunomodulation therapy, return to clinic in one year with nurse practitioner,  Levert Feinstein, M.D. Ph.D.  [facility] [address], [address] [place], [place] [address] [phone]

## 2014-12-01 ENCOUNTER — Other Ambulatory Visit (HOSPITAL_COMMUNITY): Payer: Self-pay | Admitting: Family Medicine

## 2014-12-01 ENCOUNTER — Inpatient Hospital Stay (HOSPITAL_COMMUNITY)
Admission: EM | Admit: 2014-12-01 | Discharge: 2014-12-04 | DRG: 419 | Disposition: A | Discharge status: Home / Self Care | Payer: BLUE CROSS/BLUE SHIELD | Attending: Internal Medicine | Admitting: Internal Medicine

## 2014-12-01 ENCOUNTER — Encounter (HOSPITAL_COMMUNITY): Payer: Self-pay | Admitting: Emergency Medicine

## 2014-12-01 ENCOUNTER — Ambulatory Visit (HOSPITAL_COMMUNITY)
Admission: RE | Admit: 2014-12-01 | Discharge: 2014-12-01 | Disposition: A | Discharge status: Home / Self Care | Payer: BLUE CROSS/BLUE SHIELD | Source: Ambulatory Visit | Attending: Family Medicine | Admitting: Family Medicine

## 2014-12-01 DIAGNOSIS — R1011 Right upper quadrant pain: Secondary | ICD-10-CM | POA: Diagnosis present

## 2014-12-01 DIAGNOSIS — R1013 Epigastric pain: Secondary | ICD-10-CM | POA: Diagnosis not present

## 2014-12-01 DIAGNOSIS — R945 Abnormal results of liver function studies: Secondary | ICD-10-CM

## 2014-12-01 DIAGNOSIS — R109 Unspecified abdominal pain: Secondary | ICD-10-CM

## 2014-12-01 DIAGNOSIS — K8020 Calculus of gallbladder without cholecystitis without obstruction: Secondary | ICD-10-CM | POA: Diagnosis not present

## 2014-12-01 DIAGNOSIS — K8021 Calculus of gallbladder without cholecystitis with obstruction: Secondary | ICD-10-CM | POA: Insufficient documentation

## 2014-12-01 DIAGNOSIS — K838 Other specified diseases of biliary tract: Secondary | ICD-10-CM | POA: Diagnosis not present

## 2014-12-01 DIAGNOSIS — K829 Disease of gallbladder, unspecified: Secondary | ICD-10-CM

## 2014-12-01 DIAGNOSIS — K810 Acute cholecystitis: Secondary | ICD-10-CM | POA: Diagnosis present

## 2014-12-01 DIAGNOSIS — E876 Hypokalemia: Secondary | ICD-10-CM | POA: Diagnosis not present

## 2014-12-01 DIAGNOSIS — D649 Anemia, unspecified: Secondary | ICD-10-CM | POA: Diagnosis present

## 2014-12-01 DIAGNOSIS — G35 Multiple sclerosis: Secondary | ICD-10-CM | POA: Diagnosis present

## 2014-12-01 DIAGNOSIS — Z79899 Other long term (current) drug therapy: Secondary | ICD-10-CM

## 2014-12-01 DIAGNOSIS — I10 Essential (primary) hypertension: Secondary | ICD-10-CM

## 2014-12-01 DIAGNOSIS — R7989 Other specified abnormal findings of blood chemistry: Secondary | ICD-10-CM | POA: Diagnosis present

## 2014-12-01 DIAGNOSIS — K8001 Calculus of gallbladder with acute cholecystitis with obstruction: Principal | ICD-10-CM | POA: Diagnosis present

## 2014-12-01 DIAGNOSIS — E039 Hypothyroidism, unspecified: Secondary | ICD-10-CM | POA: Diagnosis present

## 2014-12-01 LAB — COMPREHENSIVE METABOLIC PANEL
ALK PHOS: 97 U/L (ref 39–117)
ALT: 405 U/L — AB (ref 0–35)
ANION GAP: 10 (ref 5–15)
AST: 241 U/L — AB (ref 0–37)
Albumin: 4.4 g/dL (ref 3.5–5.2)
BILIRUBIN TOTAL: 3.2 mg/dL — AB (ref 0.3–1.2)
BUN: 10 mg/dL (ref 6–23)
CO2: 28 mmol/L (ref 19–32)
Calcium: 9.9 mg/dL (ref 8.4–10.5)
Chloride: 104 mmol/L (ref 96–112)
Creatinine, Ser: 0.89 mg/dL (ref 0.50–1.10)
GFR calc Af Amer: 82 mL/min — ABNORMAL LOW (ref >90)
GFR calc non Af Amer: 71 mL/min — ABNORMAL LOW (ref >90)
GLUCOSE: 101 mg/dL — AB (ref 70–99)
Potassium: 3.5 mmol/L (ref 3.5–5.1)
Sodium: 142 mmol/L (ref 135–145)
Total Protein: 8.7 g/dL — ABNORMAL HIGH (ref 6.0–8.3)

## 2014-12-01 LAB — CBC WITH DIFFERENTIAL/PLATELET
Basophils Absolute: 0 10*3/uL (ref 0.0–0.1)
Basophils Relative: 1 % (ref 0–1)
EOS ABS: 0.1 10*3/uL (ref 0.0–0.7)
Eosinophils Relative: 1 % (ref 0–5)
HCT: 39.2 % (ref 36.0–46.0)
Hemoglobin: 13.5 g/dL (ref 12.0–15.0)
Lymphocytes Relative: 56 % — ABNORMAL HIGH (ref 12–46)
Lymphs Abs: 2.8 10*3/uL (ref 0.7–4.0)
MCH: 30.8 pg (ref 26.0–34.0)
MCHC: 34.4 g/dL (ref 30.0–36.0)
MCV: 89.3 fL (ref 78.0–100.0)
MONOS PCT: 9 % (ref 3–12)
Monocytes Absolute: 0.5 10*3/uL (ref 0.1–1.0)
Neutro Abs: 1.6 10*3/uL — ABNORMAL LOW (ref 1.7–7.7)
Neutrophils Relative %: 33 % — ABNORMAL LOW (ref 43–77)
PLATELETS: 265 10*3/uL (ref 150–400)
RBC: 4.39 MIL/uL (ref 3.87–5.11)
RDW: 13.9 % (ref 11.5–15.5)
WBC: 4.9 10*3/uL (ref 4.0–10.5)

## 2014-12-01 LAB — LIPASE, BLOOD: Lipase: 29 U/L (ref 11–59)

## 2014-12-01 MED ORDER — CHLORHEXIDINE GLUCONATE 4 % EX LIQD
1.0000 "application " | Freq: Once | CUTANEOUS | Status: DC
  Filled 2014-12-01: qty 15

## 2014-12-01 MED ORDER — DEXTROSE-NACL 5-0.9 % IV SOLN
INTRAVENOUS | Status: DC
  Administered 2014-12-01 – 2014-12-02 (×2): via INTRAVENOUS

## 2014-12-01 MED ORDER — ACETAMINOPHEN 650 MG RE SUPP: 650.0000 mg | Freq: Four times a day (QID) | RECTAL | Status: DC | PRN

## 2014-12-01 MED ORDER — ALBUTEROL SULFATE (2.5 MG/3ML) 0.083% IN NEBU: 2.5000 mg | INHALATION_SOLUTION | RESPIRATORY_TRACT | Status: DC | PRN

## 2014-12-01 MED ORDER — KETOROLAC TROMETHAMINE 30 MG/ML IJ SOLN
30.0000 mg | Freq: Three times a day (TID) | INTRAMUSCULAR | Status: DC | PRN
  Administered 2014-12-01 – 2014-12-03 (×5): 30 mg via INTRAVENOUS
  Filled 2014-12-01 (×4): qty 1

## 2014-12-01 MED ORDER — ONDANSETRON HCL 4 MG/2ML IJ SOLN
4.0000 mg | Freq: Four times a day (QID) | INTRAMUSCULAR | Status: DC | PRN
  Administered 2014-12-02 (×2): 4 mg via INTRAVENOUS
  Filled 2014-12-01 (×2): qty 2

## 2014-12-01 MED ORDER — BISACODYL 10 MG RE SUPP: 10.0000 mg | Freq: Two times a day (BID) | RECTAL | Status: DC | PRN

## 2014-12-01 MED ORDER — POLYETHYLENE GLYCOL 3350 17 G PO PACK: 17.0000 g | PACK | Freq: Two times a day (BID) | ORAL | Status: DC | PRN

## 2014-12-01 MED ORDER — ENOXAPARIN SODIUM 40 MG/0.4ML ~~LOC~~ SOLN
40.0000 mg | SUBCUTANEOUS | Status: DC
Start: 2014-12-01 — End: 2014-12-04
  Administered 2014-12-01 – 2014-12-03 (×3): 40 mg via SUBCUTANEOUS
  Filled 2014-12-01 (×4): qty 0.4

## 2014-12-01 MED ORDER — SACCHAROMYCES BOULARDII 250 MG PO CAPS
250.0000 mg | ORAL_CAPSULE | Freq: Two times a day (BID) | ORAL | Status: DC
  Administered 2014-12-01 – 2014-12-04 (×6): 250 mg via ORAL
  Filled 2014-12-01 (×7): qty 1

## 2014-12-01 MED ORDER — MAGIC MOUTHWASH
15.0000 mL | Freq: Four times a day (QID) | ORAL | Status: DC | PRN
  Filled 2014-12-01: qty 15

## 2014-12-01 MED ORDER — LEVOTHYROXINE SODIUM 100 MCG PO TABS
100.0000 ug | ORAL_TABLET | ORAL | Status: DC
  Administered 2014-12-02 – 2014-12-04 (×2): 100 ug via ORAL
  Filled 2014-12-01 (×2): qty 1

## 2014-12-01 MED ORDER — ONDANSETRON HCL 4 MG/2ML IJ SOLN
4.0000 mg | Freq: Once | INTRAMUSCULAR | Status: AC
  Administered 2014-12-01: 4 mg via INTRAVENOUS
  Filled 2014-12-01: qty 2

## 2014-12-01 MED ORDER — LIP MEDEX EX OINT
1.0000 "application " | TOPICAL_OINTMENT | Freq: Two times a day (BID) | CUTANEOUS | Status: DC
  Administered 2014-12-01 – 2014-12-04 (×5): 1 via TOPICAL
  Filled 2014-12-01: qty 7

## 2014-12-01 MED ORDER — MENTHOL 3 MG MT LOZG
1.0000 | LOZENGE | OROMUCOSAL | Status: DC | PRN
  Filled 2014-12-01: qty 9

## 2014-12-01 MED ORDER — LEVOTHYROXINE SODIUM 112 MCG PO TABS
112.0000 ug | ORAL_TABLET | ORAL | Status: DC
  Administered 2014-12-03: 112 ug via ORAL
  Filled 2014-12-01 (×2): qty 1

## 2014-12-01 MED ORDER — CHLORHEXIDINE GLUCONATE 4 % EX LIQD
1.0000 "application " | Freq: Once | CUTANEOUS | Status: AC
  Administered 2014-12-01: 1 via TOPICAL
  Filled 2014-12-01: qty 15

## 2014-12-01 MED ORDER — PHENOL 1.4 % MT LIQD
2.0000 | OROMUCOSAL | Status: DC | PRN
  Filled 2014-12-01: qty 177

## 2014-12-01 MED ORDER — ACETAMINOPHEN 325 MG PO TABS
650.0000 mg | ORAL_TABLET | Freq: Four times a day (QID) | ORAL | Status: DC | PRN
  Filled 2014-12-01: qty 2

## 2014-12-01 MED ORDER — LOSARTAN POTASSIUM-HCTZ 100-12.5 MG PO TABS: 1.0000 | ORAL_TABLET | Freq: Every day | ORAL | Status: DC

## 2014-12-01 MED ORDER — GABAPENTIN 300 MG PO CAPS
600.0000 mg | ORAL_CAPSULE | Freq: Three times a day (TID) | ORAL | Status: DC
  Administered 2014-12-01 – 2014-12-04 (×8): 600 mg via ORAL
  Filled 2014-12-01 (×10): qty 2

## 2014-12-01 MED ORDER — HYDROCHLOROTHIAZIDE 12.5 MG PO CAPS
12.5000 mg | ORAL_CAPSULE | Freq: Every day | ORAL | Status: DC
  Administered 2014-12-02 – 2014-12-04 (×3): 12.5 mg via ORAL
  Filled 2014-12-01 (×3): qty 1

## 2014-12-01 MED ORDER — ONDANSETRON HCL 4 MG PO TABS: 4.0000 mg | ORAL_TABLET | Freq: Four times a day (QID) | ORAL | Status: DC | PRN

## 2014-12-01 MED ORDER — ALUM & MAG HYDROXIDE-SIMETH 200-200-20 MG/5ML PO SUSP: 30.0000 mL | Freq: Four times a day (QID) | ORAL | Status: DC | PRN

## 2014-12-01 MED ORDER — LOSARTAN POTASSIUM 50 MG PO TABS
100.0000 mg | ORAL_TABLET | Freq: Every day | ORAL | Status: DC
  Administered 2014-12-02 – 2014-12-04 (×3): 100 mg via ORAL
  Filled 2014-12-01 (×3): qty 2

## 2014-12-01 NOTE — Progress Notes (Signed)
Patient arrived to unit.  Patient alert and oriented. Able to verbalize needs. C/o pain 7/10, medicated.

## 2014-12-01 NOTE — ED Provider Notes (Addendum)
Within CSN: [id number]     Arrival date & time 12/01/14  1210 History   First MD Initiated Contact with Patient 12/01/14 1410     Chief Complaint  Patient presents with  . sent from Dr Cliffton Asters for gallstones      (Consider location/radiation/quality/duration/timing/severity/associated sxs/prior Treatment) The history is provided by the patient.   patient presents with upper abdominal pain. Has been going for the last week. Not worse with eating. Has had nausea without vomiting. No diarrhea. She saw her PCP and got lab work and an ultrasound. The ultrasound showed gallstones and she was sent in to the ER. No fevers. No blood in the emesis or change in her stool color. No pains like this before. No history of gallstones.  Past Medical History  Diagnosis Date  . MS (multiple sclerosis)   . Encounter for long-term (current) use of other medications   . High blood pressure   . Thyroid disease    Past Surgical History  Procedure Laterality Date  . Vaginal hysterectomy    . Left thumb    . Knee surgery     Family History  Problem Relation Age of Onset  . Family history unknown: Yes   History  Substance Use Topics  . Smoking status: Never Smoker   . Smokeless tobacco: Never Used  . Alcohol Use: No   OB History    No data available     Review of Systems  Constitutional: Negative for activity change, appetite change and unexpected weight change.  Eyes: Negative for pain.  Respiratory: Negative for chest tightness and shortness of breath.   Cardiovascular: Negative for chest pain and leg swelling.  Gastrointestinal: Positive for nausea, vomiting and abdominal pain. Negative for diarrhea.  Genitourinary: Negative for flank pain.  Musculoskeletal: Negative for back pain and neck stiffness.  Skin: Negative for rash.  Neurological: Negative for weakness, numbness and headaches.  Psychiatric/Behavioral: Negative for behavioral problems.      Allergies  Codeine  Home  Medications   Prior to Admission medications   Medication Sig Start Date End Date Taking? Authorizing Provider  gabapentin (NEURONTIN) 300 MG capsule Take 2 capsules (600 mg total) by mouth 3 (three) times daily. 10/09/14  Yes Levert Feinstein, MD  levothyroxine (SYNTHROID, LEVOTHROID) 100 MCG tablet Take 100 mcg by mouth every other day.    Yes Historical Provider, MD  losartan-hydrochlorothiazide (HYZAAR) 100-12.5 MG per tablet Take 1 tablet by mouth daily.  10/17/13  Yes Historical Provider, MD  SYNTHROID 112 MCG tablet Take 112 mcg by mouth every other day.  09/14/13  Yes Historical Provider, MD  traMADol (ULTRAM) 50 MG tablet Take 50 mg by mouth every 6 (six) hours as needed (For pain.).  11/30/14  Yes Historical Provider, MD   BP 147/99 mmHg  Pulse 67  Temp(Src) 97.9 F (36.6 C) (Oral)  Resp 18  SpO2 99% Physical Exam  Constitutional: She is oriented to person, place, and time. She appears well-developed and well-nourished.  HENT:  Head: Normocephalic and atraumatic.  Eyes: EOM are normal. Pupils are equal, round, and reactive to light.  Neck: Normal range of motion. Neck supple.  Cardiovascular: Normal rate, regular rhythm and normal heart sounds.   No murmur heard. Pulmonary/Chest: Effort normal and breath sounds normal. No respiratory distress. She has no wheezes. She has no rales.  Abdominal: Soft. She exhibits no distension. There is tenderness. There is no rebound and no guarding.  Upper abdominal tenderness in epigastric and right upper quadrant  area. Slight fullness. No rebound or guarding.  Musculoskeletal: Normal range of motion.  Neurological: She is alert and oriented to person, place, and time. No cranial nerve deficit.  Skin: Skin is warm.  Psychiatric: She has a normal mood and affect. Her speech is normal.  Nursing note and vitals reviewed.   ED Course  Procedures (including critical care time) Labs Review Labs Reviewed  CBC WITH DIFFERENTIAL/PLATELET - Abnormal;  Notable for the following:    Neutrophils Relative % 33 (*)    Neutro Abs 1.6 (*)    Lymphocytes Relative 56 (*)    All other components within normal limits  COMPREHENSIVE METABOLIC PANEL - Abnormal; Notable for the following:    Glucose, Bld 101 (*)    Total Protein 8.7 (*)    AST 241 (*)    ALT 405 (*)    Total Bilirubin 3.2 (*)    GFR calc non Af Amer 71 (*)    GFR calc Af Amer 82 (*)    All other components within normal limits  LIPASE, BLOOD    Imaging Review US Abdomen Complete  12/01/2014   CLINICAL DATA:  Right upper quadrant pain for 1 week, increased today, initial encounter  EXAM: ULTRASOUND ABDOMEN COMPLETE  COMPARISON:  None.  FINDINGS: Gallbladder: Multiple gallstones are noted without gallbladder wall thickening or pericholecystic fluid.  Common bile duct: Diameter: 8.8 mm which is increased in size for the patient's given age.  Liver: Diffuse heterogeneity is noted without definitive focal mass.  IVC: No abnormality visualized.  Pancreas: Visualized portion unremarkable.  Spleen: Small hyperechoic foci are noted likely representing calcified granulomas.  Right Kidney: Length: 10.8 cm. Echogenicity within normal limits. No mass or hydronephrosis visualized.  Left Kidney: Length: 10.9 cm. Echogenicity within normal limits. No mass or hydronephrosis visualized.  Abdominal aorta: No aneurysm visualized.  Other findings: None.  IMPRESSION: Cholelithiasis without other abnormality within the gallbladder. A dilated common bile duct is seen. Correlation with liver function tests is recommended.  A heterogeneous liver of uncertain significance. This may represent some fatty infiltration.   Electronically Signed   By: Alcide Clever M.D.   On: 12/01/2014 10:59     EKG Interpretation None      MDM   Final diagnoses:  Calculus of gallbladder with biliary obstruction but without cholecystitis    Patient with gallstones and upper abdominal pain. Sent in by PCP after an ultrasound  that showed gallstones with a somewhat dilated common bile duct. Will get lab work and reevaluate.    Juliet Rude. Rubin Payor, MD 12/01/14 [phone]  Lab work shows elevated LFTs. Continued pain. Will admit.   Juliet Rude. Rubin Payor, MD 12/01/14 [phone]

## 2014-12-01 NOTE — Consult Note (Signed)
[name]  [name] [name], [name] [address] Phone: [phone] FAX: Susan Moore  09/27/1956 [id number]  CARE TEAM:  PCP: [name], MD  Outpatient Care Team: Patient Care Team: [name], MD as PCP - General (Family Medicine) [name], MD as Consulting Physician (Neurology) [name], MD as Consulting Physician (Cardiology) [name], MD as Consulting Physician (Gastroenterology)  Inpatient Treatment Team: Treatment Team: Attending Provider: [name], MD; Registered Nurse: [name], RN; Registered Nurse: [name], RN; Technician: [name], NT; Technician: [name], NT; Rounding Team: [name], MD; Consulting Physician: [name], MD; Consulting Physician: [name], MD  This patient is a 59 y.o.female who presents today for surgical evaluation at the request of Dr Algis Liming, [facility].   Reason for evaluation: Gallstones with dilated common bile duct.  Patient with many medical problems such as hypertension and multiple sclerosis that are rather stable.  Apparently has been having intermittent bouts of upper abdominal pain.  Epigastric right upper quadrant.  Mainly started last weekend.  Usually worse with eating.  Some nausea and cramping.  Some dry heaves and retching but no major emesis.  Symptoms intensified 2 days ago.  When saw primary care physician yesterday.  Had ultrasound done today which showed some ductal dilatation.  Primary care physician recommended going to the emergency room.  Emergency room saw patient.  Hospitalist consulted.  Hospitalist request surgical and gastroenterology evaluation for gallstones and dilated common bile duct with elevated bilirubin and transaminases.  No personal nor family history of GI/colon cancer, inflammatory bowel disease, irritable bowel syndrome, allergy such as Celiac Sprue,  dietary/dairy problems, colitis, ulcers nor gastritis.  No recent sick contacts/gastroenteritis.  No travel outside the country.  No changes in diet.  No dysphagia to solids or liquids.  Mild heartburn/reflux.  Rarely takes Prilosec.  These attacks do not feel like that.  No hematochezia, hematemesis, coffee ground emesis.  No evidence of prior gastric/peptic ulceration.      Past Medical History  Diagnosis Date  . MS (multiple sclerosis)   . Encounter for long-term (current) use of other medications   . High blood pressure   . Thyroid disease     Past Surgical History  Procedure Laterality Date  . Vaginal hysterectomy    . Left thumb    . Knee surgery      History   Social History  . Marital Status: Married    Spouse Name: [name]    Number of Children: 3  . Years of Education: 15+   Occupational History  .      Disabled   Social History Main Topics  . Smoking status: Never Smoker   . Smokeless tobacco: Never Used  . Alcohol Use: No  . Drug Use: No  . Sexual Activity: Not on file   Other Topics Concern  . Not on file   Social History Narrative   Patient lives at home with her husband [name]).   Patient has 3 children and husbands has 4 children.   Patient has college education.   Patient is left-handed.   Patient drinks one cup of coffee in the am and two cups at night.          Family History  Problem Relation Age of Onset  . Family history unknown: Yes    Current Facility-Administered Medications  Medication Dose Route Frequency  Provider Last Rate Last Dose  . acetaminophen (TYLENOL) tablet 650 mg  650 mg Oral Q6H PRN [name], MD       Or  . acetaminophen (TYLENOL) suppository 650 mg  650 mg Rectal Q6H PRN [name], MD      . albuterol (PROVENTIL) (2.5 MG/3ML) 0.083% nebulizer solution 2.5 mg  2.5 mg Nebulization Q2H PRN [name], MD      . dextrose 5 %-0.9 % sodium chloride infusion   Intravenous Continuous [name], MD       . enoxaparin (LOVENOX) injection 40 mg  40 mg Subcutaneous Q24H [name], MD      . gabapentin (NEURONTIN) capsule 600 mg  600 mg Oral TID [name], MD      . ketorolac (TORADOL) 30 MG/ML injection 30 mg  30 mg Intravenous Q8H PRN [name], MD      . Derrill Memo ON 12/02/2014] levothyroxine (SYNTHROID, LEVOTHROID) tablet 100 mcg  100 mcg Oral QODAY [name], MD      . Derrill Memo ON 12/03/2014] levothyroxine (SYNTHROID, LEVOTHROID) tablet 112 mcg  112 mcg Oral QODAY [name], MD      . Derrill Memo ON 12/02/2014] losartan-hydrochlorothiazide (HYZAAR) 100-12.5 MG per tablet 1 tablet  1 tablet Oral Daily [name], MD      . ondansetron (ZOFRAN) tablet 4 mg  4 mg Oral Q6H PRN [name], MD       Or  . ondansetron (ZOFRAN) injection 4 mg  4 mg Intravenous Q6H PRN [name], MD       Current Outpatient Prescriptions  Medication Sig Dispense Refill  . gabapentin (NEURONTIN) 300 MG capsule Take 2 capsules (600 mg total) by mouth 3 (three) times daily. 180 capsule 11  . levothyroxine (SYNTHROID, LEVOTHROID) 100 MCG tablet Take 100 mcg by mouth every other day.     . losartan-hydrochlorothiazide (HYZAAR) 100-12.5 MG per tablet Take 1 tablet by mouth daily.     Marland Kitchen SYNTHROID 112 MCG tablet Take 112 mcg by mouth every other day.     . traMADol (ULTRAM) 50 MG tablet Take 50 mg by mouth every 6 (six) hours as needed (For pain.).   0     Allergies  Allergen Reactions  . Codeine Other (See Comments)    Recovering addict - wishes not to consume if possible    ROS: Constitutional:  No fevers, chills, sweats.  Weight stable Eyes:  No vision changes, No discharge HENT:  No sore throats, nasal drainage Lymph: No neck swelling, No bruising easily Pulmonary:  No cough, productive sputum CV: No orthopnea, PND  Patient walks 20 minutes without difficulty.  No exertional chest/neck/shoulder/arm pain. GI:  No personal nor family history of GI/colon cancer,  inflammatory bowel disease, irritable bowel syndrome, allergy such as Celiac Sprue, dietary/dairy problems, colitis, ulcers nor gastritis.  No recent sick contacts/gastroenteritis.  No travel outside the country.  No changes in diet. Renal: No UTIs, No hematuria Genital:  No drainage, bleeding, masses Musculoskeletal: No severe joint pain.  Good ROM major joints Skin:  No sores or lesions.  No rashes Heme/Lymph:  No easy bleeding.  No swollen lymph nodes Neuro: No focal weakness/numbness.  No seizures Psych: No suicidal ideation.  No hallucinations  BP 140/92 mmHg  Pulse 59  Temp(Src) 98.2 F (36.8 C) (Oral)  Resp 18  SpO2 97%  Physical Exam: General: Pt awake/alert/oriented x4 in no major acute distress Eyes:  PERRL, normal EOM. Sclera mildly icteric at most Neuro: CN II-XII intact w/o focal sensory/motor deficits. Lymph: No head/neck/groin lymphadenopathy Psych:  No delerium/psychosis/paranoia HENT: Normocephalic, Mucus membranes moist.  No thrush Neck: Supple, No tracheal deviation Chest: No pain.  Good respiratory excursion. CV:  Pulses intact.  Regular rhythm Abdomen: Soft, Nondistended.  Mild TTP epigastric & RUQ.  No Murphy's sign.  No incarcerated hernias. Ext:  SCDs BLE.  No significant edema.  No cyanosis Skin: No petechiae / purpurea.  No major sores Musculoskeletal: No severe joint pain.  Good ROM major joints   Results:   Labs: Results for orders placed or performed during the hospital encounter of 12/01/14 (from the past 48 hour(s))  CBC with Differential     Status: Abnormal   Collection Time: 12/01/14  2:15 PM  Result Value Ref Range   WBC 4.9 4.0 - 10.5 K/uL   RBC 4.39 3.87 - 5.11 MIL/uL   Hemoglobin 13.5 12.0 - 15.0 g/dL   HCT 39.2 36.0 - 46.0 %   MCV 89.3 78.0 - 100.0 fL   MCH 30.8 26.0 - 34.0 pg   MCHC 34.4 30.0 - 36.0 g/dL   RDW 13.9 11.5 - 15.5 %   Platelets 265 150 - 400 K/uL   Neutrophils Relative % 33 (L) 43 - 77 %   Neutro Abs 1.6 (L) 1.7 -  7.7 K/uL   Lymphocytes Relative 56 (H) 12 - 46 %   Lymphs Abs 2.8 0.7 - 4.0 K/uL   Monocytes Relative 9 3 - 12 %   Monocytes Absolute 0.5 0.1 - 1.0 K/uL   Eosinophils Relative 1 0 - 5 %   Eosinophils Absolute 0.1 0.0 - 0.7 K/uL   Basophils Relative 1 0 - 1 %   Basophils Absolute 0.0 0.0 - 0.1 K/uL  Comprehensive metabolic panel     Status: Abnormal   Collection Time: 12/01/14  2:15 PM  Result Value Ref Range   Sodium 142 135 - 145 mmol/L   Potassium 3.5 3.5 - 5.1 mmol/L   Chloride 104 96 - 112 mmol/L   CO2 28 19 - 32 mmol/L   Glucose, Bld 101 (H) 70 - 99 mg/dL   BUN 10 6 - 23 mg/dL   Creatinine, Ser 0.89 0.50 - 1.10 mg/dL   Calcium 9.9 8.4 - 10.5 mg/dL   Total Protein 8.7 (H) 6.0 - 8.3 g/dL   Albumin 4.4 3.5 - 5.2 g/dL   AST 241 (H) 0 - 37 U/L   ALT 405 (H) 0 - 35 U/L   Alkaline Phosphatase 97 39 - 117 U/L   Total Bilirubin 3.2 (H) 0.3 - 1.2 mg/dL   GFR calc non Af Amer 71 (L) >90 mL/min   GFR calc Af Amer 82 (L) >90 mL/min    Comment: (NOTE) The eGFR has been calculated using the CKD EPI equation. This calculation has not been validated in all clinical situations. eGFR's persistently <90 mL/min signify possible Chronic Kidney Disease.    Anion gap 10 5 - 15  Lipase, blood     Status: None   Collection Time: 12/01/14  2:15 PM  Result Value Ref Range   Lipase 29 11 - 59 U/L    Imaging / Studies: US Abdomen Complete  12/01/2014   CLINICAL DATA:  Right upper quadrant pain for 1 week, increased today, initial encounter  EXAM: ULTRASOUND ABDOMEN COMPLETE  COMPARISON:  None.  FINDINGS: Gallbladder: Multiple gallstones are noted without gallbladder wall thickening or pericholecystic fluid.  Common bile duct: Diameter: 8.8 mm which is increased in size for the patient's given age.  Liver: Diffuse heterogeneity is noted without definitive focal mass.  IVC: No abnormality visualized.  Pancreas: Visualized portion unremarkable.  Spleen: Small hyperechoic foci are noted likely  representing calcified granulomas.  Right Kidney: Length: 10.8 cm. Echogenicity within normal limits. No mass or hydronephrosis visualized.  Left Kidney: Length: 10.9 cm. Echogenicity within normal limits. No mass or hydronephrosis visualized.  Abdominal aorta: No aneurysm visualized.  Other findings: None.  IMPRESSION: Cholelithiasis without other abnormality within the gallbladder. A dilated common bile duct is seen. Correlation with liver function tests is recommended.  A heterogeneous liver of uncertain significance. This may represent some fatty infiltration.   Electronically Signed   By: Inez Catalina M.D.   On: 12/01/2014 10:59    Medications / Allergies: per chart  Antibiotics: Anti-infectives    None      Assessment  Susan Gomez  59 y.o. female       Problem List:  Principal Problem:   Common bile duct (CBD) obstruction Active Problems:   Multiple sclerosis   Essential hypertension   Hypothyroid   Cholelithiasis   Dilated common bile duct and elevated liver functions tests suspicious for choledocholithiasis.  Gallstones with biliary colic suspicious for chronic cholecystitis.  Plan:  Agree with admission.  Nothing by mouth  IV fluid hydration.  Serial exams and laboratory values.  If worsening liver function test, suspicious for more complete obstructing choledocholithiasis and would benefit from ERCP decompression first.  If symptoms and elevated liver function tests resolve, implies CBD obstruction from stones transient & self resolving.  Then plan cholecystectomy.  Most likely will need cholecystectomy this hospital admission.  I explained to the patient and her husband and son on the timing of this.  Depends how her body declares herself.  Most likely will require cholecystectomy on Sunday/Monday.  Did discuss with the patient, family & her RN.  She and her family agree with proceeding with surgery at some point:  The anatomy & physiology of hepatobiliary &  pancreatic function was discussed.  The pathophysiology of gallbladder dysfunction was discussed.  Natural history risks without surgery was discussed.   I feel the risks of no intervention will lead to serious problems that outweigh the operative risks; therefore, I recommended cholecystectomy to remove the pathology.  I explained laparoscopic techniques with possible need for an open approach.  Probable cholangiogram to evaluate the bilary tract was explained as well.    Risks such as bleeding, infection, abscess, leak, injury to other organs, need for further treatment, stroke, heart attack, death, and other risks were discussed.  I noted a good likelihood this will help address the problem.  Possibility that this will not correct all abdominal symptoms was explained.  Goals of post-operative recovery were discussed as well.  We will work to minimize complications.  An educational handout further explaining the pathology and treatment options was given as well.  Questions were answered.  The patient expresses understanding & wishes to proceed with surgery.    -VTE prophylaxis- SCDs, etc -mobilize as tolerated to help recovery    Adin Hector, M.D., F.A.C.S. Gastrointestinal and Minimally Invasive Surgery [facility] [address] [place], [place] [address] [phone] Main / Paging   12/01/2014  Note: Portions of this report may have been transcribed using voice recognition software. Every effort was made to ensure accuracy; however, inadvertent computerized transcription errors  may be present.   Any transcriptional errors that result from this process are unintentional.

## 2014-12-01 NOTE — H&P (Signed)
History and Physical  Susan Gomez SKA:[id number] DOB: Sep 21, 1956 DOA: 12/01/2014  Referring physician: Dr. Linwood Dibbles. PCP: [name], MD  Outpatient Specialists:  1. Neurology: Nolon Rod  Chief Complaint: Epigastric abdominal pain  HPI: Susan Gomez is a 59 y.o. female with history of multiple sclerosis, HTN, hypothyroid, sent from PCPs office for complaints of epigastric/RUQ abdominal pain. She was in usual state of health until Saturday when she started experiencing gradual onset of epigastric cramping kind of pain, radiating to back, worsened by oral intake, associated with intermittent dry heaves but no vomiting, no fevers or chills, no constipation or diarrhea. Symptoms were intermittent but worsened on Wednesday night. Seen by PCP yesterday and given some pain medications which helped her pain. She underwent abdominal ultrasound today which was abnormal with gallstones and hence sent to ED for further evaluation. In the ED, abnormal LFTs but normal lipase. Hospitalist consulted for further management.   Review of Systems: All systems reviewed and apart from history of presenting illness, are negative.  Past Medical History  Diagnosis Date  . MS (multiple sclerosis)   . Encounter for long-term (current) use of other medications   . High blood pressure   . Thyroid disease    Past Surgical History  Procedure Laterality Date  . Vaginal hysterectomy    . Left thumb    . Knee surgery     Social History:  reports that she has never smoked. She has never used smokeless tobacco. She reports that she does not drink alcohol or use illicit drugs. Married. Independent of activities of daily living.  Allergies  Allergen Reactions  . Codeine Other (See Comments)    Recovering addict - wishes not to consume if possible    Family History  Problem Relation Age of Onset  . Family history unknown: Yes   No significant family history.  Prior to Admission medications    Medication Sig Start Date End Date Taking? Authorizing Provider  gabapentin (NEURONTIN) 300 MG capsule Take 2 capsules (600 mg total) by mouth 3 (three) times daily. 10/09/14  Yes Levert Feinstein, MD  levothyroxine (SYNTHROID, LEVOTHROID) 100 MCG tablet Take 100 mcg by mouth every other day.    Yes Historical Provider, MD  losartan-hydrochlorothiazide (HYZAAR) 100-12.5 MG per tablet Take 1 tablet by mouth daily.  10/17/13  Yes Historical Provider, MD  SYNTHROID 112 MCG tablet Take 112 mcg by mouth every other day.  09/14/13  Yes Historical Provider, MD  traMADol (ULTRAM) 50 MG tablet Take 50 mg by mouth every 6 (six) hours as needed (For pain.).  11/30/14  Yes Historical Provider, MD   Physical Exam: Filed Vitals:   12/01/14 1220 12/01/14 1630 12/01/14 1702  BP: 147/99 129/78 140/92  Pulse: 67 61 59  Temp: 97.9 F (36.6 C)  98.2 F (36.8 C)  TempSrc: Oral  Oral  Resp: 18 16 18   SpO2: 99% 100% 97%     General exam: Moderately built and nourished pleasant middle aged female patient, lying supine on the gurney in mild intermittent painful distress.  Head, eyes and ENT: Nontraumatic and normocephalic. Pupils equally reacting to light and accommodation. Oral mucosa borderline.  Neck: Supple. No JVD, carotid bruit or thyromegaly.  Lymphatics: No lymphadenopathy.  Respiratory system: Clear to auscultation. No increased work of breathing.  Cardiovascular system: S1 and S2 heard, RRR. No JVD, murmurs, gallops, clicks or pedal edema.  Gastrointestinal system: Abdomen is obese/nondistended, soft. Mild to moderate tenderness in the RUQ and epigastric  region with guarding but no rebound or positive Murphy's sign. No peritoneal signs. Normal bowel sounds heard. No organomegaly or masses appreciated.  Central nervous system: Alert and oriented. No focal neurological deficits.  Extremities: Symmetric 5 x 5 power. Peripheral pulses symmetrically felt.   Skin: No rashes or acute  findings.  Musculoskeletal system: Negative exam.  Psychiatry: Pleasant and cooperative.   Labs on Admission:  Basic Metabolic Panel:  Recent Labs Lab 12/01/14 1415  NA 142  K 3.5  CL 104  CO2 28  GLUCOSE 101*  BUN 10  CREATININE 0.89  CALCIUM 9.9   Liver Function Tests:  Recent Labs Lab 12/01/14 1415  AST 241*  ALT 405*  ALKPHOS 97  BILITOT 3.2*  PROT 8.7*  ALBUMIN 4.4    Recent Labs Lab 12/01/14 1415  LIPASE 29   No results for input(s): AMMONIA in the last 168 hours. CBC:  Recent Labs Lab 12/01/14 1415  WBC 4.9  NEUTROABS 1.6*  HGB 13.5  HCT 39.2  MCV 89.3  PLT 265   Cardiac Enzymes: No results for input(s): CKTOTAL, CKMB, CKMBINDEX, TROPONINI in the last 168 hours.  BNP (last 3 results) No results for input(s): PROBNP in the last 8760 hours. CBG: No results for input(s): GLUCAP in the last 168 hours.  Radiological Exams on Admission: US Abdomen Complete  12/01/2014   CLINICAL DATA:  Right upper quadrant pain for 1 week, increased today, initial encounter  EXAM: ULTRASOUND ABDOMEN COMPLETE  COMPARISON:  None.  FINDINGS: Gallbladder: Multiple gallstones are noted without gallbladder wall thickening or pericholecystic fluid.  Common bile duct: Diameter: 8.8 mm which is increased in size for the patient's given age.  Liver: Diffuse heterogeneity is noted without definitive focal mass.  IVC: No abnormality visualized.  Pancreas: Visualized portion unremarkable.  Spleen: Small hyperechoic foci are noted likely representing calcified granulomas.  Right Kidney: Length: 10.8 cm. Echogenicity within normal limits. No mass or hydronephrosis visualized.  Left Kidney: Length: 10.9 cm. Echogenicity within normal limits. No mass or hydronephrosis visualized.  Abdominal aorta: No aneurysm visualized.  Other findings: None.  IMPRESSION: Cholelithiasis without other abnormality within the gallbladder. A dilated common bile duct is seen. Correlation with liver function  tests is recommended.  A heterogeneous liver of uncertain significance. This may represent some fatty infiltration.   Electronically Signed   By: Alcide Clever M.D.   On: 12/01/2014 10:59    EKG: None seen in Epic.   Assessment/Plan Principal Problem:   Cholelithiasis with biliary obstruction Active Problems:   Multiple sclerosis   Essential hypertension   Hypothyroid   Cholelithiasis   1. Symptomatic gallstones (cholelithiasis with possible CBD stones): Admit to medical floor. NPO except ice chips and sips with meds. IV fluids. Pain control with IV Toradol. No fever, leukocytosis or sonographic indication of cholecystitis: Hence we'll hold off antibiotics. Will need laparoscopic cholecystectomy +/- intraoperative cholangiogram versus ERCP followed by cholecystectomy. [facility]/Dr. Evette Cristal consulted and recommends surgery consultation as well. 2. Abnormal LFTs: Secondary to problem #1. Follow CMP in a.m. 3. Essential hypertension: Continue home medications. Controlled. 4. Hypothyroid: Continue Synthroid 5. History of multiple sclerosis: Follows with neurology OP     Code Status: Full  Family Communication: Discussed with patient's spouse at bedside.  Disposition Plan: Home when medically stable.   Time spent: 60 minutes  Russ Looper, MD, FACP, FHM. [facility] Pager [phone]  If 7PM-7AM, please contact night-coverage [email or web address] Password [facility] 12/01/2014, 5:05 PM

## 2014-12-01 NOTE — Consult Note (Signed)
Subjective:   HPI  The patient is a 59 year old female who has been experiencing epigastric abdominal pain over the past 6 days on an intermittent basis. Sometimes the pain has been quite severe and with radiation to her back. There has been associated nausea but no vomiting. She saw a primary care physician at her doctor's office who obtained labs showing elevated liver enzymes and also ordered an abdominal ultrasound which showed gallstones and a dilated common bile duct. She presented to the emergency room because of ongoing pain. She is being admitted to the hospital for further evaluation and treatment. White blood cell count is normal. Lipase normal. She does not drink alcohol.  Review of Systems Denies chest pain or shortness of breath.  Past Medical History  Diagnosis Date  . MS (multiple sclerosis)   . Encounter for long-term (current) use of other medications   . High blood pressure   . Thyroid disease    Past Surgical History  Procedure Laterality Date  . Vaginal hysterectomy    . Left thumb    . Knee surgery     History   Social History  . Marital Status: Married    Spouse Name: [name]    Number of Children: 3  . Years of Education: 15+   Occupational History  .      Disabled   Social History Main Topics  . Smoking status: Never Smoker   . Smokeless tobacco: Never Used  . Alcohol Use: No  . Drug Use: No  . Sexual Activity: Not on file   Other Topics Concern  . Not on file   Social History Narrative   Patient lives at home with her husband [name]).   Patient has 3 children and husbands has 4 children.   Patient has college education.   Patient is left-handed.   Patient drinks one cup of coffee in the am and two cups at night.         Family history is unknown by patient.  Current facility-administered medications:  .  acetaminophen (TYLENOL) tablet 650 mg, 650 mg, Oral, Q6H PRN **OR** acetaminophen (TYLENOL) suppository 650 mg, 650 mg, Rectal, Q6H PRN,  Elease Etienne, MD .  albuterol (PROVENTIL) (2.5 MG/3ML) 0.083% nebulizer solution 2.5 mg, 2.5 mg, Nebulization, Q2H PRN, Elease Etienne, MD .  dextrose 5 %-0.9 % sodium chloride infusion, , Intravenous, Continuous, Elease Etienne, MD .  enoxaparin (LOVENOX) injection 40 mg, 40 mg, Subcutaneous, Q24H, Elease Etienne, MD .  gabapentin (NEURONTIN) capsule 600 mg, 600 mg, Oral, TID, Elease Etienne, MD .  Melene Muller ON 12/02/2014] losartan (COZAAR) tablet 100 mg, 100 mg, Oral, Daily **AND** [START ON 12/02/2014] hydrochlorothiazide (MICROZIDE) capsule 12.5 mg, 12.5 mg, Oral, Daily, Terri L Green, RPH .  ketorolac (TORADOL) 30 MG/ML injection 30 mg, 30 mg, Intravenous, Q8H PRN, Elease Etienne, MD .  Melene Muller ON 12/02/2014] levothyroxine (SYNTHROID, LEVOTHROID) tablet 100 mcg, 100 mcg, Oral, Q48H, Elease Etienne, MD .  Melene Muller ON 12/03/2014] levothyroxine (SYNTHROID, LEVOTHROID) tablet 112 mcg, 112 mcg, Oral, Q48H, Anand D Hongalgi, MD .  ondansetron (ZOFRAN) tablet 4 mg, 4 mg, Oral, Q6H PRN **OR** ondansetron (ZOFRAN) injection 4 mg, 4 mg, Intravenous, Q6H PRN, Elease Etienne, MD Allergies  Allergen Reactions  . Codeine Other (See Comments)    Recovering addict - wishes not to consume if possible     Objective:     BP 140/92 mmHg  Pulse 59  Temp(Src) 98.2 F (36.8 C) (  Oral)  Resp 18  SpO2 97%  She is alert and oriented and in no acute distress  She does not appear jaundiced in the light of the room.  Heart regular rhythm no murmurs  Lungs clear  Abdomen: Bowel sounds normal, soft, there is tenderness in the epigastrium and right upper quadrant    Component Value Date/Time   WBC 4.9 12/01/2014 1415   WBC 5.0 11/22/2013 0922   HGB 13.5 12/01/2014 1415   HCT 39.2 12/01/2014 1415   PLT 265 12/01/2014 1415   ALT 405* 12/01/2014 1415   AST 241* 12/01/2014 1415   NA 142 12/01/2014 1415   NA 144 11/22/2013 0922   K 3.5 12/01/2014 1415   CL 104 12/01/2014 1415   CREATININE 0.89  12/01/2014 1415   BUN 10 12/01/2014 1415   BUN 14 11/22/2013 0922   CO2 28 12/01/2014 1415   CALCIUM 9.9 12/01/2014 1415   ALKPHOS 97 12/01/2014 1415      Laboratory No components found for: D1    Assessment:     #1. Symptomatic gallstones  #2. Rule out choledocholithiasis (elevated liver enzymes and dilated common bile duct)  #3. History of multiple sclerosis      Plan:     The patient is going to be admitted to the hospital for further evaluation and treatment. We will check liver enzymes tomorrow to try to help determine the trend. The question at this point will be whether or not she will need an ERCP prior to a laparoscopic cholecystectomy or not. We will follow clinically.

## 2014-12-01 NOTE — ED Notes (Signed)
Pt states taht she had an ultrasound done today and was sent here by dr Laurann Montana for gallstones.  Pt c/i epigastric pain 6/10 at this time. Pt states she is nauseated but not v/d.

## 2014-12-02 ENCOUNTER — Observation Stay (HOSPITAL_COMMUNITY): Payer: BLUE CROSS/BLUE SHIELD | Admitting: Anesthesiology

## 2014-12-02 ENCOUNTER — Encounter (HOSPITAL_COMMUNITY): Payer: Self-pay | Admitting: Anesthesiology

## 2014-12-02 ENCOUNTER — Encounter (HOSPITAL_COMMUNITY)
Admission: EM | Disposition: A | Discharge status: Home / Self Care | Payer: Self-pay | Source: Home / Self Care | Attending: Internal Medicine

## 2014-12-02 ENCOUNTER — Observation Stay (HOSPITAL_COMMUNITY): Payer: BLUE CROSS/BLUE SHIELD

## 2014-12-02 DIAGNOSIS — R7989 Other specified abnormal findings of blood chemistry: Secondary | ICD-10-CM | POA: Diagnosis present

## 2014-12-02 DIAGNOSIS — K8021 Calculus of gallbladder without cholecystitis with obstruction: Secondary | ICD-10-CM | POA: Insufficient documentation

## 2014-12-02 DIAGNOSIS — E876 Hypokalemia: Secondary | ICD-10-CM | POA: Insufficient documentation

## 2014-12-02 DIAGNOSIS — R945 Abnormal results of liver function studies: Secondary | ICD-10-CM

## 2014-12-02 HISTORY — PX: LAPAROSCOPIC CHOLECYSTECTOMY SINGLE SITE WITH INTRAOPERATIVE CHOLANGIOGRAM: SHX6538

## 2014-12-02 LAB — SURGICAL PCR SCREEN
MRSA, PCR: POSITIVE — AB
STAPHYLOCOCCUS AUREUS: POSITIVE — AB

## 2014-12-02 LAB — COMPREHENSIVE METABOLIC PANEL
ALBUMIN: 3.6 g/dL (ref 3.5–5.2)
ALT: 287 U/L — ABNORMAL HIGH (ref 0–35)
ANION GAP: 8 (ref 5–15)
AST: 132 U/L — ABNORMAL HIGH (ref 0–37)
Alkaline Phosphatase: 76 U/L (ref 39–117)
BILIRUBIN TOTAL: 1.8 mg/dL — AB (ref 0.3–1.2)
BUN: 13 mg/dL (ref 6–23)
CO2: 28 mmol/L (ref 19–32)
Calcium: 9.3 mg/dL (ref 8.4–10.5)
Chloride: 107 mmol/L (ref 96–112)
Creatinine, Ser: 1.02 mg/dL (ref 0.50–1.10)
GFR calc Af Amer: 70 mL/min — ABNORMAL LOW (ref >90)
GFR calc non Af Amer: 60 mL/min — ABNORMAL LOW (ref >90)
GLUCOSE: 113 mg/dL — AB (ref 70–99)
Potassium: 3.1 mmol/L — ABNORMAL LOW (ref 3.5–5.1)
SODIUM: 143 mmol/L (ref 135–145)
Total Protein: 7.3 g/dL (ref 6.0–8.3)

## 2014-12-02 LAB — CBC
HCT: 35.4 % — ABNORMAL LOW (ref 36.0–46.0)
Hemoglobin: 11.9 g/dL — ABNORMAL LOW (ref 12.0–15.0)
MCH: 30.2 pg (ref 26.0–34.0)
MCHC: 33.6 g/dL (ref 30.0–36.0)
MCV: 89.8 fL (ref 78.0–100.0)
Platelets: 230 10*3/uL (ref 150–400)
RBC: 3.94 MIL/uL (ref 3.87–5.11)
RDW: 14.2 % (ref 11.5–15.5)
WBC: 4.5 10*3/uL (ref 4.0–10.5)

## 2014-12-02 SURGERY — LAPAROSCOPIC CHOLECYSTECTOMY SINGLE SITE WITH INTRAOPERATIVE CHOLANGIOGRAM
Anesthesia: General | Site: Abdomen

## 2014-12-02 MED ORDER — NEOSTIGMINE METHYLSULFATE 10 MG/10ML IV SOLN
INTRAVENOUS | Status: DC | PRN
  Administered 2014-12-02: 4 mg via INTRAVENOUS

## 2014-12-02 MED ORDER — BUPIVACAINE-EPINEPHRINE 0.25% -1:200000 IJ SOLN
INTRAMUSCULAR | Status: DC | PRN
  Administered 2014-12-02 (×2): 50 mL

## 2014-12-02 MED ORDER — GLYCOPYRROLATE 0.2 MG/ML IJ SOLN
INTRAMUSCULAR | Status: AC
  Filled 2014-12-02: qty 3

## 2014-12-02 MED ORDER — DEXAMETHASONE SODIUM PHOSPHATE 10 MG/ML IJ SOLN
INTRAMUSCULAR | Status: DC | PRN
  Administered 2014-12-02: 10 mg via INTRAVENOUS

## 2014-12-02 MED ORDER — SODIUM CHLORIDE 0.9 % IJ SOLN
3.0000 mL | Freq: Two times a day (BID) | INTRAMUSCULAR | Status: DC
  Administered 2014-12-03 – 2014-12-04 (×3): 3 mL via INTRAVENOUS

## 2014-12-02 MED ORDER — HYDROMORPHONE HCL 1 MG/ML IJ SOLN
INTRAMUSCULAR | Status: AC
  Filled 2014-12-02: qty 1

## 2014-12-02 MED ORDER — SODIUM CHLORIDE 0.9 % IJ SOLN: 3.0000 mL | INTRAMUSCULAR | Status: DC | PRN

## 2014-12-02 MED ORDER — DEXAMETHASONE SODIUM PHOSPHATE 10 MG/ML IJ SOLN
INTRAMUSCULAR | Status: AC
  Filled 2014-12-02: qty 1

## 2014-12-02 MED ORDER — MEPERIDINE HCL 25 MG/ML IJ SOLN: 6.2500 mg | INTRAMUSCULAR | Status: DC | PRN

## 2014-12-02 MED ORDER — FENTANYL CITRATE 0.05 MG/ML IJ SOLN
INTRAMUSCULAR | Status: AC
  Filled 2014-12-02: qty 5

## 2014-12-02 MED ORDER — BUPIVACAINE-EPINEPHRINE 0.25% -1:200000 IJ SOLN
INTRAMUSCULAR | Status: AC
  Filled 2014-12-02: qty 1

## 2014-12-02 MED ORDER — DEXTROSE-NACL 5-0.9 % IV SOLN: INTRAVENOUS | Status: DC

## 2014-12-02 MED ORDER — POTASSIUM CHLORIDE CRYS ER 20 MEQ PO TBCR
40.0000 meq | EXTENDED_RELEASE_TABLET | Freq: Once | ORAL | Status: AC
  Administered 2014-12-02: 40 meq via ORAL
  Filled 2014-12-02: qty 2

## 2014-12-02 MED ORDER — FENTANYL CITRATE 0.05 MG/ML IJ SOLN
25.0000 ug | INTRAMUSCULAR | Status: DC | PRN
  Administered 2014-12-02 (×2): 50 ug via INTRAVENOUS

## 2014-12-02 MED ORDER — MUPIROCIN 2 % EX OINT
1.0000 "application " | TOPICAL_OINTMENT | Freq: Two times a day (BID) | CUTANEOUS | Status: DC
  Administered 2014-12-02 – 2014-12-04 (×5): 1 via NASAL
  Filled 2014-12-02: qty 22

## 2014-12-02 MED ORDER — GLYCOPYRROLATE 0.2 MG/ML IJ SOLN
INTRAMUSCULAR | Status: DC | PRN
  Administered 2014-12-02: 0.6 mg via INTRAVENOUS

## 2014-12-02 MED ORDER — KETOROLAC TROMETHAMINE 30 MG/ML IJ SOLN
INTRAMUSCULAR | Status: AC
  Filled 2014-12-02: qty 1

## 2014-12-02 MED ORDER — CHLORHEXIDINE GLUCONATE CLOTH 2 % EX PADS
6.0000 | MEDICATED_PAD | Freq: Every day | CUTANEOUS | Status: DC
  Administered 2014-12-02 – 2014-12-03 (×2): 6 via TOPICAL

## 2014-12-02 MED ORDER — FENTANYL CITRATE 0.05 MG/ML IJ SOLN
INTRAMUSCULAR | Status: AC
  Filled 2014-12-02: qty 2

## 2014-12-02 MED ORDER — LIDOCAINE HCL (CARDIAC) 20 MG/ML IV SOLN
INTRAVENOUS | Status: AC
  Filled 2014-12-02: qty 5

## 2014-12-02 MED ORDER — LIDOCAINE HCL (CARDIAC) 20 MG/ML IV SOLN
INTRAVENOUS | Status: DC | PRN
  Administered 2014-12-02: 50 mg via INTRAVENOUS

## 2014-12-02 MED ORDER — IOHEXOL 300 MG/ML  SOLN
INTRAMUSCULAR | Status: DC | PRN
  Administered 2014-12-02: 10 mL

## 2014-12-02 MED ORDER — ONDANSETRON HCL 4 MG/2ML IJ SOLN
INTRAMUSCULAR | Status: AC
  Filled 2014-12-02: qty 2

## 2014-12-02 MED ORDER — SUCCINYLCHOLINE CHLORIDE 20 MG/ML IJ SOLN
INTRAMUSCULAR | Status: DC | PRN
  Administered 2014-12-02: 100 mg via INTRAVENOUS

## 2014-12-02 MED ORDER — CEFTRIAXONE SODIUM IN DEXTROSE 40 MG/ML IV SOLN
2.0000 g | INTRAVENOUS | Status: DC
  Administered 2014-12-02 – 2014-12-04 (×3): 2 g via INTRAVENOUS
  Filled 2014-12-02 (×3): qty 50

## 2014-12-02 MED ORDER — ROCURONIUM BROMIDE 100 MG/10ML IV SOLN
INTRAVENOUS | Status: DC | PRN
  Administered 2014-12-02: 5 mg via INTRAVENOUS
  Administered 2014-12-02: 25 mg via INTRAVENOUS

## 2014-12-02 MED ORDER — ROCURONIUM BROMIDE 100 MG/10ML IV SOLN
INTRAVENOUS | Status: AC
  Filled 2014-12-02: qty 1

## 2014-12-02 MED ORDER — LACTATED RINGERS IV SOLN: INTRAVENOUS | Status: DC

## 2014-12-02 MED ORDER — SODIUM CHLORIDE 0.9 % IV SOLN: 250.0000 mL | INTRAVENOUS | Status: DC | PRN

## 2014-12-02 MED ORDER — MIDAZOLAM HCL 5 MG/5ML IJ SOLN
INTRAMUSCULAR | Status: DC | PRN
  Administered 2014-12-02: 2 mg via INTRAVENOUS

## 2014-12-02 MED ORDER — STERILE WATER FOR IRRIGATION IR SOLN
Status: DC | PRN
Start: 2014-12-02 — End: 2014-12-02
  Administered 2014-12-02: 1500 mL

## 2014-12-02 MED ORDER — ONDANSETRON HCL 4 MG/2ML IJ SOLN
INTRAMUSCULAR | Status: DC | PRN
  Administered 2014-12-02: 4 mg via INTRAVENOUS

## 2014-12-02 MED ORDER — LACTATED RINGERS IV BOLUS (SEPSIS): 1000.0000 mL | Freq: Three times a day (TID) | INTRAVENOUS | Status: DC | PRN

## 2014-12-02 MED ORDER — LIP MEDEX EX OINT
TOPICAL_OINTMENT | CUTANEOUS | Status: AC
Start: 2014-12-02 — End: 2014-12-03
  Filled 2014-12-02: qty 7

## 2014-12-02 MED ORDER — LACTATED RINGERS IR SOLN
Status: DC | PRN
  Administered 2014-12-02: 1

## 2014-12-02 MED ORDER — PROPOFOL 10 MG/ML IV BOLUS
INTRAVENOUS | Status: DC | PRN
Start: 2014-12-02 — End: 2014-12-02
  Administered 2014-12-02: 180 mg via INTRAVENOUS

## 2014-12-02 MED ORDER — 0.9 % SODIUM CHLORIDE (POUR BTL) OPTIME
TOPICAL | Status: DC | PRN
  Administered 2014-12-02: 1000 mL

## 2014-12-02 MED ORDER — NEOSTIGMINE METHYLSULFATE 10 MG/10ML IV SOLN
INTRAVENOUS | Status: AC
  Filled 2014-12-02: qty 1

## 2014-12-02 MED ORDER — FENTANYL CITRATE 0.05 MG/ML IJ SOLN
INTRAMUSCULAR | Status: DC | PRN
  Administered 2014-12-02: 50 ug via INTRAVENOUS
  Administered 2014-12-02: 100 ug via INTRAVENOUS
  Administered 2014-12-02 (×2): 50 ug via INTRAVENOUS

## 2014-12-02 MED ORDER — MIDAZOLAM HCL 2 MG/2ML IJ SOLN
INTRAMUSCULAR | Status: AC
  Filled 2014-12-02: qty 2

## 2014-12-02 MED ORDER — PROPOFOL 10 MG/ML IV BOLUS
INTRAVENOUS | Status: AC
  Filled 2014-12-02: qty 20

## 2014-12-02 MED ORDER — LACTATED RINGERS IV SOLN
INTRAVENOUS | Status: DC | PRN
  Administered 2014-12-02 (×2): via INTRAVENOUS

## 2014-12-02 MED ORDER — PROMETHAZINE HCL 25 MG/ML IJ SOLN: 6.2500 mg | INTRAMUSCULAR | Status: DC | PRN

## 2014-12-02 SURGICAL SUPPLY — 36 items
APPLIER CLIP 5 13 M/L LIGAMAX5 (MISCELLANEOUS) ×2
CABLE HIGH FREQUENCY MONO STRZ (ELECTRODE) ×2 IMPLANT
CHLORAPREP W/TINT 26ML (MISCELLANEOUS) ×2 IMPLANT
CLIP APPLIE 5 13 M/L LIGAMAX5 (MISCELLANEOUS) ×1 IMPLANT
COVER MAYO STAND STRL (DRAPES) ×2 IMPLANT
DECANTER SPIKE VIAL GLASS SM (MISCELLANEOUS) ×2 IMPLANT
DRAIN CHANNEL 19F RND (DRAIN) IMPLANT
DRAPE C-ARM 42X120 X-RAY (DRAPES) ×2 IMPLANT
DRAPE LAPAROSCOPIC ABDOMINAL (DRAPES) ×2 IMPLANT
DRAPE UTILITY XL STRL (DRAPES) ×2 IMPLANT
DRAPE WARM FLUID 44X44 (DRAPE) ×2 IMPLANT
DRSG TEGADERM 4X4.75 (GAUZE/BANDAGES/DRESSINGS) ×2 IMPLANT
ELECT REM PT RETURN 9FT ADLT (ELECTROSURGICAL) ×2
ELECTRODE REM PT RTRN 9FT ADLT (ELECTROSURGICAL) ×1 IMPLANT
ENDOLOOP SUT PDS II  0 18 (SUTURE)
ENDOLOOP SUT PDS II 0 18 (SUTURE) IMPLANT
EVACUATOR SILICONE 100CC (DRAIN) IMPLANT
GAUZE SPONGE 2X2 8PLY STRL LF (GAUZE/BANDAGES/DRESSINGS) ×1 IMPLANT
GLOVE ECLIPSE 8.0 STRL XLNG CF (GLOVE) ×2 IMPLANT
GLOVE INDICATOR 8.0 STRL GRN (GLOVE) ×2 IMPLANT
GOWN STRL REUS W/TWL XL LVL3 (GOWN DISPOSABLE) ×4 IMPLANT
KIT BASIN OR (CUSTOM PROCEDURE TRAY) ×2 IMPLANT
NS IRRIG 1000ML POUR BTL (IV SOLUTION) ×2 IMPLANT
POUCH SPECIMEN RETRIEVAL 10MM (ENDOMECHANICALS) ×2 IMPLANT
SCISSORS LAP 5X35 DISP (ENDOMECHANICALS) ×2 IMPLANT
SET CHOLANGIOGRAPH MIX (MISCELLANEOUS) ×2 IMPLANT
SET IRRIG TUBING LAPAROSCOPIC (IRRIGATION / IRRIGATOR) ×2 IMPLANT
SHEARS HARMONIC ACE PLUS 36CM (ENDOMECHANICALS) ×2 IMPLANT
SPONGE GAUZE 2X2 STER 10/PKG (GAUZE/BANDAGES/DRESSINGS) ×1
SUT MNCRL AB 4-0 PS2 18 (SUTURE) ×2 IMPLANT
SYR 20CC LL (SYRINGE) ×2 IMPLANT
TOWEL OR 17X26 10 PK STRL BLUE (TOWEL DISPOSABLE) ×2 IMPLANT
TOWEL OR NON WOVEN STRL DISP B (DISPOSABLE) ×2 IMPLANT
TRAY LAPAROSCOPIC (CUSTOM PROCEDURE TRAY) ×2 IMPLANT
TROCAR BLADELESS OPT 5 100 (ENDOMECHANICALS) ×2 IMPLANT
TROCAR BLADELESS OPT 5 150 (ENDOMECHANICALS) ×2 IMPLANT

## 2014-12-02 NOTE — Progress Notes (Signed)
UR completed 

## 2014-12-02 NOTE — Anesthesia Preprocedure Evaluation (Addendum)
Anesthesia Evaluation  Patient identified by MRN, date of birth, ID band Patient awake    Reviewed: Allergy & Precautions, NPO status , Patient's Chart, lab work & pertinent test results, reviewed documented beta blocker date and time   Airway Mallampati: II  TM Distance: >3 FB Neck ROM: Full    Dental  (+) Edentulous Upper, Edentulous Lower   Pulmonary neg pulmonary ROS,  breath sounds clear to auscultation  Pulmonary exam normal       Cardiovascular hypertension, Pt. on medications Rhythm:Regular Rate:Normal     Neuro/Psych Multiple Sclerosis negative psych ROS   GI/Hepatic Neg liver ROS, Cholecystitis   Endo/Other  Hypothyroidism Obesity  Renal/GU negative Renal ROS  negative genitourinary   Musculoskeletal negative musculoskeletal ROS (+)   Abdominal (+) + obese,  Abdomen: tender.    Peds  Hematology negative hematology ROS (+)   Anesthesia Other Findings   Reproductive/Obstetrics negative OB ROS                            Anesthesia Physical Anesthesia Plan  ASA: II  Anesthesia Plan: General   Post-op Pain Management:    Induction: Intravenous  Airway Management Planned: Oral ETT  Additional Equipment:   Intra-op Plan:   Post-operative Plan: Extubation in OR  Informed Consent: I have reviewed the patients History and Physical, chart, labs and discussed the procedure including the risks, benefits and alternatives for the proposed anesthesia with the patient or authorized representative who has indicated his/her understanding and acceptance.   Dental advisory given  Plan Discussed with: Anesthesiologist, CRNA and Surgeon  Anesthesia Plan Comments:         Anesthesia Quick Evaluation

## 2014-12-02 NOTE — Op Note (Signed)
12/02/2014  4:45 PM  PATIENT:  Susan Gomez  59 y.o. female  Patient Care Team: [name], MD as PCP - General (Family Medicine) Levert Feinstein, MD as Consulting Physician (Neurology) Quintella Reichert, MD as Consulting Physician (Cardiology) Iva Boop, MD as Consulting Physician (Gastroenterology)  PRE-OPERATIVE DIAGNOSIS:  cholecystitis  POST-OPERATIVE DIAGNOSIS:  Acute cholecystitis  PROCEDURE:  Procedure(s): LAPAROSCOPIC CHOLECYSTECTOMY SINGLE SITE WITH INTRAOPERATIVE CHOLANGIOGRAM  SURGEON:  Surgeon(s): Karie Soda, MD Glenna Fellows, MD - Assist  ANESTHESIA:   local and general  EBL:  Total I/O In: 1000 [I.V.:1000] Out: -   Delay start of Pharmacological VTE agent (>24hrs) due to surgical blood loss or risk of bleeding:  no  DRAINS: none   SPECIMEN:  Source of Specimen:  Gallbladder   DISPOSITION OF SPECIMEN:  PATHOLOGY  COUNTS:  YES  PLAN OF CARE: Admit to inpatient   PATIENT DISPOSITION:  PACU - hemodynamically stable.  INDICATION: Patient with symptomatic gallstones suspicious for cholecystitis. Had elevated liver function tests gradually improved. Suspicious for resolving choledocholithiasis. I recommended surgery with interrupted cholangiogram.  The anatomy & physiology of hepatobiliary & pancreatic function was discussed.  The pathophysiology of gallbladder dysfunction was discussed.  Natural history risks without surgery was discussed.   I feel the risks of no intervention will lead to serious problems that outweigh the operative risks; therefore, I recommended cholecystectomy to remove the pathology.  I explained laparoscopic techniques with possible need for an open approach.  Probable cholangiogram to evaluate the bilary tract was explained as well.    Risks such as bleeding, infection, abscess, leak, injury to other organs, need for further treatment, heart attack, death, and other risks were discussed.  I noted a good likelihood this will help  address the problem.  Possibility that this will not correct all abdominal symptoms was explained.  Goals of post-operative recovery were discussed as well.  We will work to minimize complications.  An educational handout further explaining the pathology and treatment options was given as well.  Questions were answered.  The patient expresses understanding & wishes to proceed with surgery.  OR FINDINGS: Thickened gallbladder wall with edema suspicious for acute cholecystitis. Mild adhesions.  Glandular classic biliary anatomy. Mildly dilated biliary system and no evidence of obstruction.  DESCRIPTION:   The patient was identified & brought in the operating room. The patient was positioned supine with arms tucked. SCDs were active during the entire case. The patient underwent general anesthesia without any difficulty.  The abdomen was prepped and draped in a sterile fashion. A Surgical Timeout confirmed our plan.  I made a transverse curvilinear incision through the superior umbilical fold.  I placed a 62mm long port through the supraumbilical fascia using a modified Hassan cutdown technique. I began carbon dioxide insufflation. Camera inspection revealed no injury. There were no adhesions to the anterior abdominal wall supraumbilically.  I proceeded to continue with single site technique. I placed a #5 port in left upper aspect of the wound. I placed a 5 mm atraumatic grasper in the right inferior aspect of the wound.  I turned attention to the right upper quadrant.  Gallbladder was edematous consistent with acute cholecystitis but no evidence of ischemia or necrosis.  The gallbladder fundus was elevated cephalad. I freed the peritoneal coverings between the gallbladder and the liver on the posteriolateral and anteriomedial walls. I alternated between Harmonic & blunt Maryland dissection to help get a good critical view of the cystic artery and cystic duct. I  did further dissection to free a few  centimeters of the  gallbladder off the liver bed to get a good critical view of the infundibulum and cystic duct. I mobilized the cystic artery; and, after getting a good 360 view, ligated the cystic artery using the Harmonic ultrasonic dissection. I skeletonized the cystic duct.  I placed a clip on the infundibulum. I did a partial cystic duct-otomy and ensured patency. I placed a 5 [place] cholangiocatheter through a puncture site at the right subcostal ridge of the abdominal wall and directed it into the cystic duct.  We ran a cholangiogram with dilute radio-opaque contrast and continuous fluoroscopy.  Contrast flowed from a side branch consistent with cystic duct cannulization. Contrast flowed up the common hepatic duct into the right and left intrahepatic chains out to secondary radicals. Contrast flowed down the common bile duct easily across the normal ampulla into the duodenum.  Mild extrahepatic biliary dilatation but not severe. No evidence of obstruction.This was consistent with a normal cholangiogram.  I removed the cholangiocatheter. I placed clips on the cystic duct x4.  I completed cystic duct transection. I freed the gallbladder from its remaining attachments to the liver. I ensured hemostasis on the gallbladder fossa of the liver and elsewhere. I inspected the rest of the abdomen & detected no injury nor bleeding elsewhere.  I removed the gallbladder out the supraumbilical fascia. I closed the fascia transversely using 0 Vicryl interrupted stitches. A closed the skin using 4-0 monocryl stitch.  Sterile dressing was applied. The patient was extubated & arrived in the PACU in stable condition..  I had discussed postoperative care with the patient in the holding area.  I am about to locate the patient's family and discuss operative findings and postoperative goals / instructions.  Instructions are written in the chart as well.  Ardeth Sportsman, M.D., F.A.C.S. Gastrointestinal and  Minimally Invasive Surgery [facility] [address] [place], [place] [address] [phone] Main / Paging

## 2014-12-02 NOTE — Transfer of Care (Signed)
Immediate Anesthesia Transfer of Care Note  Patient: Susan Gomez  Procedure(s) Performed: Procedure(s): LAPAROSCOPIC CHOLECYSTECTOMY SINGLE SITE WITH INTRAOPERATIVE CHOLANGIOGRAM (N/A)  Patient Location: PACU  Anesthesia Type:General  Level of Consciousness: awake, alert  and oriented  Airway & Oxygen Therapy: Patient Spontanous Breathing and Patient connected to face mask oxygen  Post-op Assessment: Report given to RN and Post -op Vital signs reviewed and stable  Post vital signs: Reviewed and stable  Last Vitals:  Filed Vitals:   12/02/14 1300  BP: 118/80  Pulse: 57  Temp: 36.3 C  Resp: 18    Complications: No apparent anesthesia complications

## 2014-12-02 NOTE — Progress Notes (Signed)
PROGRESS NOTE    Susan Gomez ZOX:[id number] DOB: [birth year] DOA: 12/01/2014 PCP: [name], MD  HPI/Brief narrative 59 y.o. female with history of multiple sclerosis, HTN, hypothyroid, sent from PCPs office with a 5 day history of epigastric/RUQ abdominal pain, nausea and abnormal abdominal ultrasound showing gallstones, mildly dilated CBD and evaluation in the ED revealed abnormal LFTs consistent with CBD stones. GI and surgery consulted.  Assessment/Plan:  1. Symptomatic gallstones (cholelithiasis with possible CBD stones): NPO except ice chips and sips with meds. IV fluids. Pain control with IV Toradol. No fever, leukocytosis or sonographic indication of cholecystitis: Hence we'll hold off antibiotics. LFTs improving. Pain better controlled. GI and surgery input appreciated. Possible laparoscopic cholecystectomy 1/31 or 2/1+/- ERCP prior 2. Abnormal LFTs: Secondary to problem #1. Improving. Follow LFTs. 3. Hypokalemia: Replace and follow 4. Essential hypertension: Continue home medications. Controlled. 5. Hypothyroid: Continue Synthroid 6. History of multiple sclerosis: Follows with neurology OP 7. Anemia: Follow CBCs. May be dilutional.   Code Status: Full Family Communication: Discussed with family at bedside. Disposition Plan: Home when medically stable.   Consultants:  [facility]  General Surgery  Procedures:  None  Antibiotics:  None   Subjective: Abdominal pain better controlled-rates at 4/10 in severity. Nausea intermittently but no vomiting.  Objective: Filed Vitals:   12/01/14 1702 12/01/14 1817 12/01/14 2158 12/02/14 0504  BP: 140/92 158/83 139/89 143/88  Pulse: 59 58 55 56  Temp: 98.2 F (36.8 C) 97.7 F (36.5 C) 97.9 F (36.6 C) 97.6 F (36.4 C)  TempSrc: Oral Oral Oral Oral  Resp: SpO2: 97% 100% 99% 99%    Intake/Output Summary (Last 24 hours) at 12/02/14 0933 Last data filed at 12/02/14 0458  Gross per 24 hour    Intake 1048.33 ml  Output      0 ml  Net 1048.33 ml   There were no vitals filed for this visit.   Exam:  General exam: Pleasant middle-aged female lying comfortably in bed. Respiratory system: Clear. No increased work of breathing. Cardiovascular system: S1 & S2 heard, RRR. No JVD, murmurs, gallops, clicks or pedal edema. Gastrointestinal system: Abdomen is nondistended, soft. Mild to moderate tenderness in RUQ and epigastric region with guarding but no rebound. Normal bowel sounds heard. Central nervous system: Alert and oriented. No focal neurological deficits. Extremities: Symmetric 5 x 5 power.   Data Reviewed: Basic Metabolic Panel:  Recent Labs Lab 12/01/14 1415 12/02/14 0527  NA 142 143  K 3.5 3.1*  CL 104 107  CO2 28 28  GLUCOSE 101* 113*  BUN 10 13  CREATININE 0.89 1.02  CALCIUM 9.9 9.3   Liver Function Tests:  Recent Labs Lab 12/01/14 1415 12/02/14 0527  AST 241* 132*  ALT 405* 287*  ALKPHOS 97 76  BILITOT 3.2* 1.8*  PROT 8.7* 7.3  ALBUMIN 4.4 3.6    Recent Labs Lab 12/01/14 1415  LIPASE 29   No results for input(s): AMMONIA in the last 168 hours. CBC:  Recent Labs Lab 12/01/14 1415 12/02/14 0527  WBC 4.9 4.5  NEUTROABS 1.6*  --   HGB 13.5 11.9*  HCT 39.2 35.4*  MCV 89.3 89.8  PLT 265 230   Cardiac Enzymes: No results for input(s): CKTOTAL, CKMB, CKMBINDEX, TROPONINI in the last 168 hours. BNP (last 3 results) No results for input(s): PROBNP in the last 8760 hours. CBG: No results for input(s): GLUCAP in the last 168 hours.  Recent Results (from the past 240  hour(s))  Surgical pcr screen     Status: Abnormal   Collection Time: 12/01/14 10:42 PM  Result Value Ref Range Status   MRSA, PCR POSITIVE (A) NEGATIVE Final    Comment: RESULT CALLED TO, READ BACK BY AND VERIFIED WITH: B.JONES,RN AT 0415 ON 12/02/14 BY [email or web address]    Staphylococcus aureus POSITIVE (A) NEGATIVE Final    Comment:        The Xpert SA Assay (FDA approved for  NASAL specimens in patients over 17 years of age), is one component of a comprehensive surveillance program.  Test performance has been validated by [facility] for patients greater than or equal to 3 year old. It is not intended to diagnose infection nor to guide or monitor treatment.           Studies: US Abdomen Complete  12/01/2014   CLINICAL DATA:  Right upper quadrant pain for 1 week, increased today, initial encounter  EXAM: ULTRASOUND ABDOMEN COMPLETE  COMPARISON:  None.  FINDINGS: Gallbladder: Multiple gallstones are noted without gallbladder wall thickening or pericholecystic fluid.  Common bile duct: Diameter: 8.8 mm which is increased in size for the patient's given age.  Liver: Diffuse heterogeneity is noted without definitive focal mass.  IVC: No abnormality visualized.  Pancreas: Visualized portion unremarkable.  Spleen: Small hyperechoic foci are noted likely representing calcified granulomas.  Right Kidney: Length: 10.8 cm. Echogenicity within normal limits. No mass or hydronephrosis visualized.  Left Kidney: Length: 10.9 cm. Echogenicity within normal limits. No mass or hydronephrosis visualized.  Abdominal aorta: No aneurysm visualized.  Other findings: None.  IMPRESSION: Cholelithiasis without other abnormality within the gallbladder. A dilated common bile duct is seen. Correlation with liver function tests is recommended.  A heterogeneous liver of uncertain significance. This may represent some fatty infiltration.   Electronically Signed   By: Alcide Clever M.D.   On: 12/01/2014 10:59        Scheduled Meds: . chlorhexidine  1 application Topical Once  . Chlorhexidine Gluconate Cloth  6 each Topical Q0600  . enoxaparin (LOVENOX) injection  40 mg Subcutaneous Q24H  . gabapentin  600 mg Oral TID  . losartan  100 mg Oral Daily   And  . hydrochlorothiazide  12.5 mg Oral Daily  . levothyroxine  100 mcg Oral Q48H  . [START ON 12/03/2014] levothyroxine  112 mcg Oral  Q48H  . lip balm  1 application Topical BID  . mupirocin ointment  1 application Nasal BID  . potassium chloride  40 mEq Oral Once  . saccharomyces boulardii  250 mg Oral BID   Continuous Infusions: . dextrose 5 % and 0.9% NaCl 100 mL/hr at 12/02/14 0457    Principal Problem:   Common bile duct (CBD) obstruction Active Problems:   Multiple sclerosis   Essential hypertension   Hypothyroid   Cholelithiasis    Time spent: 30 minutes    Prentice Sackrider, MD, FACP, FHM. [facility] Pager [phone]  If 7PM-7AM, please contact night-coverage [email or web address] Password [email or web address] 12/02/2014, 9:33 AM    LOS: 1 day

## 2014-12-02 NOTE — Anesthesia Postprocedure Evaluation (Signed)
  Anesthesia Post-op Note  Patient: Susan Gomez  Procedure(s) Performed: Procedure(s): LAPAROSCOPIC CHOLECYSTECTOMY SINGLE SITE WITH INTRAOPERATIVE CHOLANGIOGRAM (N/A)  Patient Location: PACU  Anesthesia Type:General  Level of Consciousness: awake, alert  and oriented  Airway and Oxygen Therapy: Patient Spontanous Breathing  Post-op Pain: mild  Post-op Assessment: Post-op Vital signs reviewed, Patient's Cardiovascular Status Stable, Respiratory Function Stable, Patent Airway, No signs of Nausea or vomiting and Pain level controlled  Post-op Vital Signs: Reviewed and stable  Last Vitals:  Filed Vitals:   12/02/14 1755  BP: 151/92  Pulse: 56  Temp: 36.5 C  Resp: 16    Complications: No apparent anesthesia complications

## 2014-12-02 NOTE — Progress Notes (Signed)
[address]  [address] [place], [place] [address] Phone: [phone] FAX: [place] [phone] 05/15/1956  CARE TEAM:  PCP: [name], MD  Outpatient Care Team: Patient Care Team: [name], MD as PCP - General (Family Medicine) [name], MD as Consulting Physician (Neurology) [name], MD as Consulting Physician (Cardiology) [name], MD as Consulting Physician (Gastroenterology)  Inpatient Treatment Team: Treatment Team: Attending Provider: [name], MD; Consulting Physician: [name], MD; Consulting Physician: [name], MD; Registered Nurse: Vivi Martens, RN; Registered Nurse: Anthoney Harada, RN; Technician: Wilder Glade, NT; Technician: Julian Reil, [place]; Rounding Team: Garner Gavel, MD; Registered Nurse: Hervey Ard, RN   Subjective:  Less sore.  Less nauseated.  Daughter in room.  Nurse just outside room.  Objective:  Vital signs:  Filed Vitals:   12/01/14 1702 12/01/14 1817 12/01/14 2158 12/02/14 0504  BP: 140/92 158/83 139/89 143/88  Pulse: 59 58 55 56  Temp: 98.2 F (36.8 C) 97.7 F (36.5 C) 97.9 F (36.6 C) 97.6 F (36.4 C)  TempSrc: Oral Oral Oral Oral  Resp: _0 SpO2: 97% 100% 99% 99%       Intake/Output   Yesterday:  01/29 0701 - 01/30 0700 In: 1048.3 [I.V.:1048.3] Out: -  This shift:     Bowel function:  Flatus: y  BM: n  Drain: n/a  Physical Exam:  General: Pt awake/alert/oriented x4 in no acute distress Eyes: PERRL, normal EOM.  Sclera clear.  No icterus Neuro: CN II-XII intact w/o focal sensory/motor deficits. Lymph: No head/neck/groin lymphadenopathy Psych:  No delerium/psychosis/paranoia HENT: Normocephalic, Mucus membranes moist.  No thrush Neck: Supple, No tracheal deviation Chest: No chest wall pain w good excursion CV:  Pulses intact.  Regular rhythm MS: Normal AROM mjr  joints.  No obvious deformity Abdomen: Soft.  Nondistended.  Mildly tender at RUQ only.  No evidence of peritonitis.  No incarcerated hernias. Ext:  SCDs BLE.  No mjr edema.  No cyanosis Skin: No petechiae / purpura   Problem List:   Principal Problem:   Common bile duct (CBD) obstruction Active Problems:   Multiple sclerosis   Essential hypertension   Hypothyroid   Cholelithiasis   Assessment  Susan Gomez  59 y.o. female       Persistent right upper quadrant pain suspicious for cholecystitis.  Improvement of liver function tests 3 c/w resolving choledocholithiasis.    Plan:  Laparoscopic cholecystectomy with interoperative cholangiogram first.  Since numbers improving, need for ERCP less likely.  If persistent common bile duct stones on IOC then may need follow-up ERCP.  I discussed with Dr. Penelope Coop with gastroenterology.  Discussed with the patient's nurse.  Discussed with the patient & her daughter.  All are in agreement.  We will try and do later today.    -VTE prophylaxis- SCDs, etc -mobilize as tolerated to help recovery  Adin Hector, M.D., F.A.C.S. Gastrointestinal and Minimally Invasive Surgery [facility] [address], [place], [place] [address] [phone] Main / Paging   12/02/2014   Results:   Labs: Results for orders placed or performed during the hospital encounter of 12/01/14 (from the past 48 hour(s))  CBC with Differential     Status: Abnormal   Collection Time: 12/01/14  2:15 PM  Result Value Ref Range   WBC 4.9 4.0 - 10.5 K/uL  RBC 4.39 3.87 - 5.11 MIL/uL   Hemoglobin 13.5 12.0 - 15.0 g/dL   HCT 39.2 36.0 - 46.0 %   MCV 89.3 78.0 - 100.0 fL   MCH 30.8 26.0 - 34.0 pg   MCHC 34.4 30.0 - 36.0 g/dL   RDW 13.9 11.5 - 15.5 %   Platelets 265 150 - 400 K/uL   Neutrophils Relative % 33 (L) 43 - 77 %   Neutro Abs 1.6 (L) 1.7 - 7.7 K/uL   Lymphocytes Relative 56 (H) 12 - 46 %   Lymphs Abs 2.8 0.7 -  4.0 K/uL   Monocytes Relative 9 3 - 12 %   Monocytes Absolute 0.5 0.1 - 1.0 K/uL   Eosinophils Relative 1 0 - 5 %   Eosinophils Absolute 0.1 0.0 - 0.7 K/uL   Basophils Relative 1 0 - 1 %   Basophils Absolute 0.0 0.0 - 0.1 K/uL  Comprehensive metabolic panel     Status: Abnormal   Collection Time: 12/01/14  2:15 PM  Result Value Ref Range   Sodium 142 135 - 145 mmol/L   Potassium 3.5 3.5 - 5.1 mmol/L   Chloride 104 96 - 112 mmol/L   CO2 28 19 - 32 mmol/L   Glucose, Bld 101 (H) 70 - 99 mg/dL   BUN 10 6 - 23 mg/dL   Creatinine, Ser 0.89 0.50 - 1.10 mg/dL   Calcium 9.9 8.4 - 10.5 mg/dL   Total Protein 8.7 (H) 6.0 - 8.3 g/dL   Albumin 4.4 3.5 - 5.2 g/dL   AST 241 (H) 0 - 37 U/L   ALT 405 (H) 0 - 35 U/L   Alkaline Phosphatase 97 39 - 117 U/L   Total Bilirubin 3.2 (H) 0.3 - 1.2 mg/dL   GFR calc non Af Amer 71 (L) >90 mL/min   GFR calc Af Amer 82 (L) >90 mL/min    Comment: (NOTE) The eGFR has been calculated using the CKD EPI equation. This calculation has not been validated in all clinical situations. eGFR's persistently <90 mL/min signify possible Chronic Kidney Disease.    Anion gap 10 5 - 15  Lipase, blood     Status: None   Collection Time: 12/01/14  2:15 PM  Result Value Ref Range   Lipase 29 11 - 59 U/L  Surgical pcr screen     Status: Abnormal   Collection Time: 12/01/14 10:42 PM  Result Value Ref Range   MRSA, PCR POSITIVE (A) NEGATIVE    Comment: RESULT CALLED TO, READ BACK BY AND VERIFIED WITH: B.JONES,RN AT 1245 ON 12/02/14 BY [email or web address]    Staphylococcus aureus POSITIVE (A) NEGATIVE    Comment:        The Xpert SA Assay (FDA approved for NASAL specimens in patients over 23 years of age), is one component of a comprehensive surveillance program.  Test performance has been validated by [facility] for patients greater than or equal to 55 year old. It is not intended to diagnose infection nor to guide or monitor treatment.   Comprehensive metabolic panel      Status: Abnormal   Collection Time: 12/02/14  5:27 AM  Result Value Ref Range   Sodium 143 135 - 145 mmol/L   Potassium 3.1 (L) 3.5 - 5.1 mmol/L   Chloride 107 96 - 112 mmol/L   CO2 28 19 - 32 mmol/L   Glucose, Bld 113 (H) 70 - 99 mg/dL   BUN 13 6 - 23 mg/dL   Creatinine, Ser  1.02 0.50 - 1.10 mg/dL   Calcium 9.3 8.4 - 10.5 mg/dL   Total Protein 7.3 6.0 - 8.3 g/dL   Albumin 3.6 3.5 - 5.2 g/dL   AST 132 (H) 0 - 37 U/L   ALT 287 (H) 0 - 35 U/L   Alkaline Phosphatase 76 39 - 117 U/L   Total Bilirubin 1.8 (H) 0.3 - 1.2 mg/dL   GFR calc non Af Amer 60 (L) >90 mL/min   GFR calc Af Amer 70 (L) >90 mL/min    Comment: (NOTE) The eGFR has been calculated using the CKD EPI equation. This calculation has not been validated in all clinical situations. eGFR's persistently <90 mL/min signify possible Chronic Kidney Disease.    Anion gap 8 5 - 15  CBC     Status: Abnormal   Collection Time: 12/02/14  5:27 AM  Result Value Ref Range   WBC 4.5 4.0 - 10.5 K/uL   RBC 3.94 3.87 - 5.11 MIL/uL   Hemoglobin 11.9 (L) 12.0 - 15.0 g/dL   HCT 35.4 (L) 36.0 - 46.0 %   MCV 89.8 78.0 - 100.0 fL   MCH 30.2 26.0 - 34.0 pg   MCHC 33.6 30.0 - 36.0 g/dL   RDW 14.2 11.5 - 15.5 %   Platelets 230 150 - 400 K/uL    Imaging / Studies: US Abdomen Complete  12/01/2014   CLINICAL DATA:  Right upper quadrant pain for 1 week, increased today, initial encounter  EXAM: ULTRASOUND ABDOMEN COMPLETE  COMPARISON:  None.  FINDINGS: Gallbladder: Multiple gallstones are noted without gallbladder wall thickening or pericholecystic fluid.  Common bile duct: Diameter: 8.8 mm which is increased in size for the patient's given age.  Liver: Diffuse heterogeneity is noted without definitive focal mass.  IVC: No abnormality visualized.  Pancreas: Visualized portion unremarkable.  Spleen: Small hyperechoic foci are noted likely representing calcified granulomas.  Right Kidney: Length: 10.8 cm. Echogenicity within normal limits. No mass or  hydronephrosis visualized.  Left Kidney: Length: 10.9 cm. Echogenicity within normal limits. No mass or hydronephrosis visualized.  Abdominal aorta: No aneurysm visualized.  Other findings: None.  IMPRESSION: Cholelithiasis without other abnormality within the gallbladder. A dilated common bile duct is seen. Correlation with liver function tests is recommended.  A heterogeneous liver of uncertain significance. This may represent some fatty infiltration.   Electronically Signed   By: Inez Catalina M.D.   On: 12/01/2014 10:59    Medications / Allergies: per chart  Antibiotics: Anti-infectives    None       Note: Portions of this report may have been transcribed using voice recognition software. Every effort was made to ensure accuracy; however, inadvertent computerized transcription errors may be present.   Any transcriptional errors that result from this process are unintentional.

## 2014-12-03 DIAGNOSIS — I10 Essential (primary) hypertension: Secondary | ICD-10-CM | POA: Diagnosis present

## 2014-12-03 DIAGNOSIS — E876 Hypokalemia: Secondary | ICD-10-CM | POA: Diagnosis not present

## 2014-12-03 DIAGNOSIS — E039 Hypothyroidism, unspecified: Secondary | ICD-10-CM | POA: Diagnosis present

## 2014-12-03 DIAGNOSIS — D649 Anemia, unspecified: Secondary | ICD-10-CM

## 2014-12-03 DIAGNOSIS — Z79899 Other long term (current) drug therapy: Secondary | ICD-10-CM | POA: Diagnosis not present

## 2014-12-03 DIAGNOSIS — K810 Acute cholecystitis: Secondary | ICD-10-CM | POA: Diagnosis present

## 2014-12-03 DIAGNOSIS — R1013 Epigastric pain: Secondary | ICD-10-CM | POA: Diagnosis present

## 2014-12-03 DIAGNOSIS — K8001 Calculus of gallbladder with acute cholecystitis with obstruction: Secondary | ICD-10-CM | POA: Diagnosis present

## 2014-12-03 DIAGNOSIS — G35 Multiple sclerosis: Secondary | ICD-10-CM | POA: Diagnosis present

## 2014-12-03 LAB — COMPREHENSIVE METABOLIC PANEL
ALT: 231 U/L — ABNORMAL HIGH (ref 0–35)
AST: 107 U/L — AB (ref 0–37)
Albumin: 3.4 g/dL — ABNORMAL LOW (ref 3.5–5.2)
Alkaline Phosphatase: 67 U/L (ref 39–117)
Anion gap: 8 (ref 5–15)
BUN: 10 mg/dL (ref 6–23)
CALCIUM: 9 mg/dL (ref 8.4–10.5)
CO2: 26 mmol/L (ref 19–32)
CREATININE: 0.95 mg/dL (ref 0.50–1.10)
Chloride: 107 mmol/L (ref 96–112)
GFR calc Af Amer: 76 mL/min — ABNORMAL LOW (ref >90)
GFR, EST NON AFRICAN AMERICAN: 66 mL/min — AB (ref >90)
Glucose, Bld: 127 mg/dL — ABNORMAL HIGH (ref 70–99)
POTASSIUM: 3.4 mmol/L — AB (ref 3.5–5.1)
SODIUM: 141 mmol/L (ref 135–145)
TOTAL PROTEIN: 6.8 g/dL (ref 6.0–8.3)
Total Bilirubin: 1.1 mg/dL (ref 0.3–1.2)

## 2014-12-03 LAB — CBC
HCT: 33.5 % — ABNORMAL LOW (ref 36.0–46.0)
Hemoglobin: 11.2 g/dL — ABNORMAL LOW (ref 12.0–15.0)
MCH: 30 pg (ref 26.0–34.0)
MCHC: 33.4 g/dL (ref 30.0–36.0)
MCV: 89.8 fL (ref 78.0–100.0)
Platelets: 239 10*3/uL (ref 150–400)
RBC: 3.73 MIL/uL — ABNORMAL LOW (ref 3.87–5.11)
RDW: 14.2 % (ref 11.5–15.5)
WBC: 5.5 10*3/uL (ref 4.0–10.5)

## 2014-12-03 MED ORDER — KETOROLAC TROMETHAMINE 30 MG/ML IJ SOLN
15.0000 mg | Freq: Three times a day (TID) | INTRAMUSCULAR | Status: AC | PRN
  Administered 2014-12-04: 15 mg via INTRAVENOUS
  Filled 2014-12-03: qty 1

## 2014-12-03 MED ORDER — ACETAMINOPHEN 500 MG PO TABS
1000.0000 mg | ORAL_TABLET | Freq: Three times a day (TID) | ORAL | Status: DC
  Administered 2014-12-03 – 2014-12-04 (×4): 1000 mg via ORAL
  Filled 2014-12-03 (×6): qty 2

## 2014-12-03 MED ORDER — POTASSIUM CHLORIDE CRYS ER 20 MEQ PO TBCR
40.0000 meq | EXTENDED_RELEASE_TABLET | Freq: Once | ORAL | Status: AC
  Administered 2014-12-03: 40 meq via ORAL
  Filled 2014-12-03: qty 2

## 2014-12-03 NOTE — Progress Notes (Signed)
PROGRESS NOTE    Susan CIOLINO ZOX:[id number] DOB: 01/07/1956 DOA: 12/01/2014 PCP: [name], MD  HPI/Brief narrative 59 y.o. female with history of multiple sclerosis, HTN, hypothyroid, sent from PCPs office with a 5 day history of epigastric/RUQ abdominal pain, nausea and abnormal abdominal ultrasound showing gallstones, mildly dilated CBD and evaluation in the ED revealed abnormal LFTs consistent with CBD stones. GI and surgery consulted.  Assessment/Plan:  1. Acute cholecystitis: Patient was made NPO. Initial presentation was felt to be secondary to CBD stones. Due to low index of suspicion for acute cholecystitis, antibiotics were not initially started. LFTs were monitored daily and improving. GI and surgery were consulted. Status post laparoscopic cholecystectomy 1/30 which showed findings of acute cholecystitis. IOC was negative. On IV Rocephin. Improving. Management per general surgery. 2. Abnormal LFTs: Secondary to problem #1. Improving. Follow LFTs. 3. Hypokalemia: Better. Replace and follow. 4. Essential hypertension: Continue home medications. Mildly uncontrolled. 5. Hypothyroid: Continue Synthroid 6. History of multiple sclerosis: Follows with neurology OP 7. Anemia: Stable. Follow CBC in a.m.   Code Status: Full Family Communication: Discussed with granddaughter at bedside. Disposition Plan: Home when medically stable-possibly 12/04/14.   Consultants:  [facility]  General Surgery  Procedures:  Laparoscopic cholecystectomy and intraoperative cholangiogram 12/02/14  Antibiotics:  IV Rocephin 12/02/14 >  Subjective: Feels much better. Abdominal pain almost resolved. Tolerating liquid diet. No nausea or vomiting. Flatus + +. No BM.  Objective: Filed Vitals:   12/02/14 1736 12/02/14 1755 12/02/14 2159 12/03/14 0547  BP:  151/92 157/90 133/87  Pulse:  56 65 58  Temp: 97.7 F (36.5 C) 97.7 F (36.5 C) 98.1 F (36.7 C) 98 F (36.7 C)  TempSrc:  Oral  Oral Oral  Resp:  SpO2:  100% 97% 99%    Intake/Output Summary (Last 24 hours) at 12/03/14 1042 Last data filed at 12/03/14 0940  Gross per 24 hour  Intake 2137.5 ml  Output      3 ml  Net 2134.5 ml   There were no vitals filed for this visit.   Exam:  General exam: Pleasant middle-aged female lying comfortably in bed. Respiratory system: Clear. No increased work of breathing. Cardiovascular system: S1 & S2 heard, RRR. No JVD, murmurs, gallops, clicks or pedal edema. Gastrointestinal system: Abdomen is mildly distended, soft and nontender. Normal bowel sounds heard. Central nervous system: Alert and oriented. No focal neurological deficits. Extremities: Symmetric 5 x 5 power.   Data Reviewed: Basic Metabolic Panel:  Recent Labs Lab 12/01/14 1415 12/02/14 0527 12/03/14 0524  NA 142 143 141  K 3.5 3.1* 3.4*  CL 104 107 107  CO2 GLUCOSE 101* 113* 127*  BUN CREATININE 0.89 1.02 0.95  CALCIUM 9.9 9.3 9.0   Liver Function Tests:  Recent Labs Lab 12/01/14 1415 12/02/14 0527 12/03/14 0524  AST 241* 132* 107*  ALT 405* 287* 231*  ALKPHOS 97 76 67  BILITOT 3.2* 1.8* 1.1  PROT 8.7* 7.3 6.8  ALBUMIN 4.4 3.6 3.4*    Recent Labs Lab 12/01/14 1415  LIPASE 29   No results for input(s): AMMONIA in the last 168 hours. CBC:  Recent Labs Lab 12/01/14 1415 12/02/14 0527 12/03/14 0524  WBC 4.9 4.5 5.5  NEUTROABS 1.6*  --   --   HGB 13.5 11.9* 11.2*  HCT 39.2 35.4* 33.5*  MCV 89.3 89.8 89.8  PLT 265 230 239   Cardiac Enzymes: No results for  input(s): CKTOTAL, CKMB, CKMBINDEX, TROPONINI in the last 168 hours. BNP (last 3 results) No results for input(s): PROBNP in the last 8760 hours. CBG: No results for input(s): GLUCAP in the last 168 hours.  Recent Results (from the past 240 hour(s))  Surgical pcr screen     Status: Abnormal   Collection Time: 12/01/14 10:42 PM  Result Value Ref Range Status   MRSA, PCR POSITIVE (A)  NEGATIVE Final    Comment: RESULT CALLED TO, READ BACK BY AND VERIFIED WITH: B.JONES,RN AT 0415 ON 12/02/14 BY SHEA.W    Staphylococcus aureus POSITIVE (A) NEGATIVE Final    Comment:        The Xpert SA Assay (FDA approved for NASAL specimens in patients over 75 years of age), is one component of a comprehensive surveillance program.  Test performance has been validated by [facility] for patients greater than or equal to 22 year old. It is not intended to diagnose infection nor to guide or monitor treatment.           Studies: Dg Cholangiogram Operative  12/02/2014   CLINICAL DATA:  Intraoperative cholangiogram.  EXAM: INTRAOPERATIVE CHOLANGIOGRAM  TECHNIQUE: Cholangiographic images from the C-arm fluoroscopic device were submitted for interpretation post-operatively. Please see the procedural report for the amount of contrast and the fluoroscopy time utilized.  COMPARISON:  None.  FINDINGS: There intra and extrahepatic biliary tree is well opacified as is the cystic duct stump. There is no evidence stricture. No filling defect is seen to suggest a retained duct stone.  IMPRESSION: Intraoperative cholangiogram.  No evidence of a retained duct stone.   Electronically Signed   By: Amie Portland M.D.   On: 12/02/2014 16:47   US Abdomen Complete  12/01/2014   CLINICAL DATA:  Right upper quadrant pain for 1 week, increased today, initial encounter  EXAM: ULTRASOUND ABDOMEN COMPLETE  COMPARISON:  None.  FINDINGS: Gallbladder: Multiple gallstones are noted without gallbladder wall thickening or pericholecystic fluid.  Common bile duct: Diameter: 8.8 mm which is increased in size for the patient's given age.  Liver: Diffuse heterogeneity is noted without definitive focal mass.  IVC: No abnormality visualized.  Pancreas: Visualized portion unremarkable.  Spleen: Small hyperechoic foci are noted likely representing calcified granulomas.  Right Kidney: Length: 10.8 cm. Echogenicity within normal  limits. No mass or hydronephrosis visualized.  Left Kidney: Length: 10.9 cm. Echogenicity within normal limits. No mass or hydronephrosis visualized.  Abdominal aorta: No aneurysm visualized.  Other findings: None.  IMPRESSION: Cholelithiasis without other abnormality within the gallbladder. A dilated common bile duct is seen. Correlation with liver function tests is recommended.  A heterogeneous liver of uncertain significance. This may represent some fatty infiltration.   Electronically Signed   By: Alcide Clever M.D.   On: 12/01/2014 10:59        Scheduled Meds: . cefTRIAXone (ROCEPHIN)  IV  2 g Intravenous Q24H  . Chlorhexidine Gluconate Cloth  6 each Topical Q0600  . enoxaparin (LOVENOX) injection  40 mg Subcutaneous Q24H  . gabapentin  600 mg Oral TID  . losartan  100 mg Oral Daily   And  . hydrochlorothiazide  12.5 mg Oral Daily  . levothyroxine  100 mcg Oral Q48H  . levothyroxine  112 mcg Oral Q48H  . lip balm  1 application Topical BID  . mupirocin ointment  1 application Nasal BID  . saccharomyces boulardii  250 mg Oral BID  . sodium chloride  3 mL Intravenous Q12H   Continuous  Infusions: . dextrose 5 % and 0.9% NaCl    . lactated ringers      Principal Problem:   Elevated LFTs, probable CBD stone Active Problems:   Multiple sclerosis   Essential hypertension   Hypothyroid   Cholelithiasis   Calculus of gallbladder with biliary obstruction but without cholecystitis   Hypokalemia    Time spent: 30 minutes    Jerra Huckeby, MD, FACP, FHM. [facility] Pager [phone]  If 7PM-7AM, please contact night-coverage [email or web address] Password [email or web address] 12/03/2014, 10:42 AM    LOS: 2 days

## 2014-12-03 NOTE — Progress Notes (Signed)
Patient ID: Susan Gomez, female   DOB: 1956-01-28, 59 y.o.   MRN: [id number] 1 Day Post-Op  Subjective: No major complaints, just sore. Tolerating liquid diet.  Objective: Vital signs in last 24 hours: Temp:  [97.3 F (36.3 C)-98.2 F (36.8 C)] 98 F (36.7 C) (01/31 0547) Pulse Rate:  [56-65] 58 (01/31 0547) Resp:  [16-20] 18 (01/31 0547) BP: (118-157)/(77-92) 133/87 mmHg (01/31 0547) SpO2:  [97 %-100 %] 99 % (01/31 0547)    Intake/Output from previous day: 01/30 0701 - 01/31 0700 In: 2137.5 [I.V.:2137.5] Out: -  Intake/Output this shift: Total I/O In: -  Out: 3 [Urine:3]  General appearance: alert, cooperative and no distress GI: normal findings: soft, non-tender Incision/Wound: dressing clean and dry  Lab Results:   Recent Labs  12/02/14 0527 12/03/14 0524  WBC 4.5 5.5  HGB 11.9* 11.2*  HCT 35.4* 33.5*  PLT 230 239   BMET  Recent Labs  12/02/14 0527 12/03/14 0524  NA 143 141  K 3.1* 3.4*  CL 107 107  CO2 28 26  GLUCOSE 113* 127*  BUN 13 10  CREATININE 1.02 0.95  CALCIUM 9.3 9.0     Studies/Results: Dg Cholangiogram Operative  12/02/2014   CLINICAL DATA:  Intraoperative cholangiogram.  EXAM: INTRAOPERATIVE CHOLANGIOGRAM  TECHNIQUE: Cholangiographic images from the C-arm fluoroscopic device were submitted for interpretation post-operatively. Please see the procedural report for the amount of contrast and the fluoroscopy time utilized.  COMPARISON:  None.  FINDINGS: There intra and extrahepatic biliary tree is well opacified as is the cystic duct stump. There is no evidence stricture. No filling defect is seen to suggest a retained duct stone.  IMPRESSION: Intraoperative cholangiogram.  No evidence of a retained duct stone.   Electronically Signed   By: Amie Portland M.D.   On: 12/02/2014 16:47    Anti-infectives: Anti-infectives    Start     Dose/Rate Route Frequency Ordered Stop   12/02/14 1000  cefTRIAXone (ROCEPHIN) 2 g in dextrose 5 % 50 mL  IVPB - Premix    Comments:  Pharmacy may adjust dosing strength / duration / interval for maximal efficacy   2 g100 mL/hr over 30 Minutes Intravenous Every 24 hours 12/02/14 0952        Assessment/Plan: s/p Procedure(s): LAPAROSCOPIC CHOLECYSTECTOMY SINGLE SITE WITH INTRAOPERATIVE CHOLANGIOGRAM Doing well postoperatively without complication. Okay for discharge from surgical perspective   LOS: 2 days    Jannetta Massey T 12/03/2014

## 2014-12-03 NOTE — Progress Notes (Signed)
UR completed 

## 2014-12-04 ENCOUNTER — Encounter (HOSPITAL_COMMUNITY): Payer: Self-pay | Admitting: Surgery

## 2014-12-04 LAB — COMPREHENSIVE METABOLIC PANEL
ALK PHOS: 60 U/L (ref 39–117)
ALT: 175 U/L — AB (ref 0–35)
AST: 85 U/L — AB (ref 0–37)
Albumin: 3.3 g/dL — ABNORMAL LOW (ref 3.5–5.2)
Anion gap: 6 (ref 5–15)
BUN: 11 mg/dL (ref 6–23)
CALCIUM: 9.1 mg/dL (ref 8.4–10.5)
CHLORIDE: 108 mmol/L (ref 96–112)
CO2: 28 mmol/L (ref 19–32)
Creatinine, Ser: 1.02 mg/dL (ref 0.50–1.10)
GFR, EST AFRICAN AMERICAN: 70 mL/min — AB (ref >90)
GFR, EST NON AFRICAN AMERICAN: 60 mL/min — AB (ref >90)
Glucose, Bld: 99 mg/dL (ref 70–99)
POTASSIUM: 3.8 mmol/L (ref 3.5–5.1)
Sodium: 142 mmol/L (ref 135–145)
Total Bilirubin: 0.7 mg/dL (ref 0.3–1.2)
Total Protein: 6.6 g/dL (ref 6.0–8.3)

## 2014-12-04 LAB — CBC
HCT: 32.7 % — ABNORMAL LOW (ref 36.0–46.0)
Hemoglobin: 10.9 g/dL — ABNORMAL LOW (ref 12.0–15.0)
MCH: 30.1 pg (ref 26.0–34.0)
MCHC: 33.3 g/dL (ref 30.0–36.0)
MCV: 90.3 fL (ref 78.0–100.0)
PLATELETS: 215 10*3/uL (ref 150–400)
RBC: 3.62 MIL/uL — AB (ref 3.87–5.11)
RDW: 14.3 % (ref 11.5–15.5)
WBC: 5.8 10*3/uL (ref 4.0–10.5)

## 2014-12-04 MED ORDER — TRAMADOL HCL 50 MG PO TABS: 50.0000 mg | ORAL_TABLET | Freq: Four times a day (QID) | ORAL | Status: DC | PRN

## 2014-12-04 MED ORDER — NAPROXEN SODIUM 220 MG PO TABS: ORAL_TABLET | ORAL | Status: DC

## 2014-12-04 NOTE — Progress Notes (Signed)
Discharge instructions and medications reviewed with patient. Patient verbalizes understanding. Patient confirms she has all personal belongings in her possession. Patient discharged home.

## 2014-12-04 NOTE — Discharge Instructions (Signed)
LAPAROSCOPIC SURGERY: POST OP INSTRUCTIONS ° °1. DIET: Follow a light bland diet the first 24 hours after arrival home, such as soup, liquids, crackers, etc.  Be sure to include lots of fluids daily.  Avoid fast food or heavy meals as your are more likely to get nauseated.  Eat a low fat the next few days after surgery.   °2. Take your usually prescribed home medications unless otherwise directed. °3. PAIN CONTROL: °a. Pain is best controlled by a usual combination of three different methods TOGETHER: °i. Ice/Heat °ii. Over the counter pain medication °iii. Prescription pain medication °b. Most patients will experience some swelling and bruising around the incisions.  Ice packs or heating pads (30-60 minutes up to 6 times a day) will help. Use ice for the first few days to help decrease swelling and bruising, then switch to heat to help relax tight/sore spots and speed recovery.  Some people prefer to use ice alone, heat alone, alternating between ice & heat.  Experiment to what works for you.  Swelling and bruising can take several weeks to resolve.   °c. It is helpful to take an over-the-counter pain medication regularly for the first few weeks.  Choose one of the following that works best for you: °i. Naproxen (Aleve, etc)  Two 220mg tabs twice a day °ii. Ibuprofen (Advil, etc) Three 200mg tabs four times a day (every meal & bedtime) °iii. Acetaminophen (Tylenol, etc) 500-650mg four times a day (every meal & bedtime) °d. A  prescription for pain medication (such as oxycodone, hydrocodone, etc) should be given to you upon discharge.  Take your pain medication as prescribed.  °i. If you are having problems/concerns with the prescription medicine (does not control pain, nausea, vomiting, rash, itching, etc), please call us [phone] to see if we need to switch you to a different pain medicine that will work better for you and/or control your side effect better. °ii. If you need a refill on your pain medication,  please contact your pharmacy.  They will contact our office to request authorization. Prescriptions will not be filled after 5 pm or on week-ends. °4. Avoid getting constipated.  Between the surgery and the pain medications, it is common to experience some constipation.  Increasing fluid intake and taking a fiber supplement (such as Metamucil, Citrucel, FiberCon, MiraLax, etc) 1-2 times a day regularly will usually help prevent this problem from occurring.  A mild laxative (prune juice, Milk of Magnesia, MiraLax, etc) should be taken according to package directions if there are no bowel movements after 48 hours.   °5. Watch out for diarrhea.  If you have many loose bowel movements, simplify your diet to bland foods & liquids for a few days.  Stop any stool softeners and decrease your fiber supplement.  Switching to mild anti-diarrheal medications (Kayopectate, Pepto Bismol) can help.  If this worsens or does not improve, please call us. °6. Wash / shower every day.  You may shower over the dressings as they are waterproof.  Continue to shower over incision(s) after the dressing is off. °7. Remove your waterproof bandages 5 days after surgery.  You may leave the incision open to air.  You may replace a dressing/Band-Aid to cover the incision for comfort if you wish.  °8. ACTIVITIES as tolerated:   °a. You may resume regular (light) daily activities beginning the next day--such as daily self-care, walking, climbing stairs--gradually increasing activities as tolerated.  If you can walk 30 minutes without difficulty, it   is safe to try more intense activity such as jogging, treadmill, bicycling, low-impact aerobics, swimming, etc. b. Save the most intensive and strenuous activity for last such as sit-ups, heavy lifting, contact sports, etc  Refrain from any heavy lifting or straining until you are off narcotics for pain control.   c. DO NOT PUSH THROUGH PAIN.  Let pain be your guide: If it hurts to do something, don't  do it.  Pain is your body warning you to avoid that activity for another week until the pain goes down. d. You may drive when you are no longer taking prescription pain medication, you can comfortably wear a seatbelt, and you can safely maneuver your car and apply brakes. e. Dennis Bast may have sexual intercourse when it is comfortable.  9. FOLLOW UP in our office a. Please call [facility] at [phone] to set up an appointment to see your surgeon in the office for a follow-up appointment approximately 2-3 weeks after your surgery. b. Make sure that you call for this appointment the day you arrive home to insure a convenient appointment time. 10. IF YOU HAVE DISABILITY OR FAMILY LEAVE FORMS, BRING THEM TO THE OFFICE FOR PROCESSING.  DO NOT GIVE THEM TO YOUR DOCTOR.   WHEN TO CALL us [phone]: 1. Poor pain control 2. Reactions / problems with new medications (rash/itching, nausea, etc)  3. Fever over 101.5 F (38.5 C) 4. Inability to urinate 5. Nausea and/or vomiting 6. Worsening swelling or bruising 7. Continued bleeding from incision. 8. Increased pain, redness, or drainage from the incision   The clinic staff is available to answer your questions during regular business hours (8:30am-5pm).  Please dont hesitate to call and ask to speak to one of our nurses for clinical concerns.   If you have a medical emergency, go to the nearest emergency room or call 911.  A surgeon from [facility] is always on call at the [facility], [address], [place], [place], [place]  [address] ? MAIN: [phone] ? TOLL FREE: [phone] ?  FAX [phone] [email or web address]   Cholecystitis Cholecystitis is an inflammation of your gallbladder. It is usually caused by a buildup of gallstones or sludge (cholelithiasis) in your gallbladder. The gallbladder stores a fluid that helps digest fats (bile). Cholecystitis is serious and needs  treatment right away.  CAUSES   Gallstones. Gallstones can block the tube that leads to your gallbladder, causing bile to build up. As bile builds up, the gallbladder becomes inflamed.  Bile duct problems, such as blockage from scarring or kinking.  Tumors. Tumors can stop bile from leaving your gallbladder correctly, causing bile to build up. As bile builds up, the gallbladder becomes inflamed. SYMPTOMS   Nausea.  Vomiting.  Abdominal pain, especially in the upper right area of your abdomen.  Abdominal tenderness or bloating.  Sweating.  Chills.  Fever.  Yellowing of the skin and the whites of the eyes (jaundice). DIAGNOSIS  Your caregiver may order blood tests to look for infection or gallbladder problems. Your caregiver may also order imaging tests, such as an ultrasound or computed tomography (CT) scan. Further tests may include a hepatobiliary iminodiacetic acid (HIDA) scan. This scan allows your caregiver to see your bile move from the liver to the gallbladder and to the small intestine. TREATMENT  A hospital stay is usually necessary to lessen the inflammation of your gallbladder. You may be required to not eat or drink (fast) for  a certain amount of time. You may be given medicine to treat pain or an antibiotic medicine to treat an infection. Surgery may be needed to remove your gallbladder (cholecystectomy) once the inflammation has gone down. Surgery may be needed right away if you develop complications such as death of gallbladder tissue (gangrene) or a tear (perforation) of the gallbladder.  HOME CARE INSTRUCTIONS  Home care will depend on your treatment. In general:  If you were given antibiotics, take them as directed. Finish them even if you start to feel better.  Only take over-the-counter or prescription medicines for pain, discomfort, or fever as directed by your caregiver.  Follow a low-fat diet until you see your caregiver again.  Keep all follow-up visits as  directed by your caregiver. SEEK IMMEDIATE MEDICAL CARE IF:   Your pain is increasing and not controlled by medicines.  Your pain moves to another part of your abdomen or to your back.  You have a fever.  You have nausea and vomiting. MAKE SURE YOU:  Understand these instructions.  Will watch your condition.  Will get help right away if you are not doing well or get worse. Document Released: 10/20/2005 Document Revised: 01/12/2012 Document Reviewed: 09/05/2011 [facility] Patient Information 2015 [employer], [place]. This information is not intended to replace advice given to you by your health care provider. Make sure you discuss any questions you have with your health care provider.  Managing Pain  Pain after surgery or related to activity is often due to strain/injury to muscle, tendon, nerves and/or incisions.  This pain is usually short-term and will improve in a few months.   Many people find it helpful to do the following things TOGETHER to help speed the process of healing and to get back to regular activity more quickly:  1. Avoid heavy physical activity a.  no lifting greater than 20 pounds b. Do not push through the pain.  Listen to your body and avoid positions and maneuvers than reproduce the pain c. Walking is okay as tolerated, but go slowly and stop when getting sore.  d. Remember: If it hurts to do it, then dont do it! 2. Take Anti-inflammatory medication  a. Take with food/snack around the clock for 1-2 weeks i. This helps the muscle and nerve tissues become less irritable and calm down faster b. Choose ONE of the following over-the-counter medications: i. Naproxen 220mg  tabs (ex. Aleve) 1-2 pills twice a day  ii. Ibuprofen 200mg  tabs (ex. Advil, Motrin) 3-4 pills with every meal and just before bedtime iii. Acetaminophen 500mg  tabs (Tylenol) 1-2 pills with every meal and just before bedtime 3. Use a Heating pad or Ice/Cold Pack a. 4-6 times a day b. May use warm  bath/hottub  or showers 4. Try Gentle Massage and/or Stretching  a. at the area of pain many times a day b. stop if you feel pain - do not overdo it  Try these steps together to help you body heal faster and avoid making things get worse.  Doing just one of these things may not be enough.    If you are not getting better after two weeks or are noticing you are getting worse, contact our office for further advice; we may need to re-evaluate you & see what other things we can do to help.  GETTING TO GOOD BOWEL HEALTH. Irregular bowel habits such as constipation and diarrhea can lead to many problems over time.  Having one soft bowel movement a day is the  most important way to prevent further problems.  The anorectal canal is designed to handle stretching and feces to safely manage our ability to get rid of solid waste (feces, poop, stool) out of our body.  BUT, hard constipated stools can act like ripping concrete bricks and diarrhea can be a burning fire to this very sensitive area of our body, causing inflamed hemorrhoids, anal fissures, increasing risk is perirectal abscesses, abdominal pain/bloating, an making irritable bowel worse.     °The goal: ONE SOFT BOWEL MOVEMENT A DAY!  To have soft, regular bowel movements:  °  Drink at least 8 tall glasses of water a day.   °  Take plenty of fiber.  Fiber is the undigested part of plant food that passes into the colon, acting s “natures broom” to encourage bowel motility and movement.  Fiber can absorb and hold large amounts of water. This results in a larger, bulkier stool, which is soft and easier to pass. Work gradually over several weeks up to 6 servings a day of fiber (25g a day even more if needed) in the form of: °o Vegetables -- Root (potatoes, carrots, turnips), leafy green (lettuce, salad greens, celery, spinach), or cooked high residue (cabbage, broccoli, etc) °o Fruit -- Fresh (unpeeled skin & pulp), Dried (prunes, apricots, cherries, etc ),  or  stewed ( applesauce)  °o Whole grain breads, pasta, etc (whole wheat)  °o Bran cereals  °  Bulking Agents -- This type of water-retaining fiber generally is easily obtained each day by one of the following:  °o Psyllium bran -- The psyllium plant is remarkable because its ground seeds can retain so much water. This product is available as Metamucil, Konsyl, Effersyllium, Per Diem Fiber, or the less expensive generic preparation in drug and health food stores. Although labeled a laxative, it really is not a laxative.  °o Methylcellulose -- This is another fiber derived from wood which also retains water. It is available as Citrucel. °o Polyethylene Glycol - and “artificial” fiber commonly called Miralax or Glycolax.  It is helpful for people with gassy or bloated feelings with regular fiber °o Flax Seed - a less gassy fiber than psyllium °  No reading or other relaxing activity while on the toilet. If bowel movements take longer than 5 minutes, you are too constipated °  AVOID CONSTIPATION.  High fiber and water intake usually takes care of this.  Sometimes a laxative is needed to stimulate more frequent bowel movements, but  °  Laxatives are not a good long-term solution as it can wear the colon out. °o Osmotics (Milk of Magnesia, Fleets phosphosoda, Magnesium citrate, MiraLax, GoLytely) are safer than  °o Stimulants (Senokot, Castor Oil, Dulcolax, Ex Lax)    °o Do not take laxatives for more than 7days in a row. °   IF SEVERELY CONSTIPATED, try a Bowel Retraining Program: °o Do not use laxatives.  °o Eat a diet high in roughage, such as bran cereals and leafy vegetables.  °o Drink six (6) ounces of prune or apricot juice each morning.  °o Eat two (2) large servings of stewed fruit each day.  °o Take one (1) heaping tablespoon of a psyllium-based bulking agent twice a day. Use sugar-free sweetener when possible to avoid excessive calories.  °o Eat a normal breakfast.  °o Set aside 15 minutes after breakfast to sit  on the toilet, but do not strain to have a bowel movement.  °o If you do not have a bowel   movement by the third day, use an enema and repeat the above steps.  °  Controlling diarrhea °o Switch to liquids and simpler foods for a few days to avoid stressing your intestines further. °o Avoid dairy products (especially milk & ice cream) for a short time.  The intestines often can lose the ability to digest lactose when stressed. °o Avoid foods that cause gassiness or bloating.  Typical foods include beans and other legumes, cabbage, broccoli, and dairy foods.  Every person has some sensitivity to other foods, so listen to our body and avoid those foods that trigger problems for you. °o Adding fiber (Citrucel, Metamucil, psyllium, Miralax) gradually can help thicken stools by absorbing excess fluid and retrain the intestines to act more normally.  Slowly increase the dose over a few weeks.  Too much fiber too soon can backfire and cause cramping & bloating. °o Probiotics (such as active yogurt, Align, etc) may help repopulate the intestines and colon with normal bacteria and calm down a sensitive digestive tract.  Most studies show it to be of mild help, though, and such products can be costly. °o Medicines: °  Bismuth subsalicylate (ex. Kayopectate, Pepto Bismol) every 30 minutes for up to 6 doses can help control diarrhea.  Avoid if pregnant. °  Loperamide (Immodium) can slow down diarrhea.  Start with two tablets (4mg total) first and then try one tablet every 6 hours.  Avoid if you are having fevers or severe pain.  If you are not better or start feeling worse, stop all medicines and call your doctor for advice °o Call your doctor if you are getting worse or not better.  Sometimes further testing (cultures, endoscopy, X-ray studies, bloodwork, etc) may be needed to help diagnose and treat the cause of the diarrhea. °o  °

## 2014-12-04 NOTE — Discharge Summary (Signed)
Physician Discharge Summary  Susan Gomez:[id number] DOB: Jun 24, 1956 DOA: 12/01/2014  PCP: [name], MD  Admit date: 12/01/2014 Discharge date: 12/04/2014  Time spent: Less than 30 minutes  Recommendations for Outpatient Follow-up:  1. [name], PCP in 5 days with repeat labs (CBC & CMP). 2. [facility] [place] on 12/26/14 at 2 PM. Will need to follow-up gallbladder pathology results when available.  Discharge Diagnoses:  Principal Problem:   Elevated LFTs, probable CBD stone Active Problems:   Multiple sclerosis   Essential hypertension   Hypothyroid   Cholelithiasis   Calculus of gallbladder with biliary obstruction but without cholecystitis   Hypokalemia   Acute cholecystitis   Discharge Condition: Improved & Stable  Diet recommendation: Heart healthy diet.  There were no vitals filed for this visit.  History of present illness:  59 y.o. female with history of multiple sclerosis, HTN, hypothyroid, sent from PCPs office with a 5 day history of epigastric/RUQ abdominal pain, nausea and abnormal abdominal ultrasound showing gallstones, mildly dilated CBD and evaluation in the ED revealed abnormal LFTs consistent with CBD stones. GI and surgery consulted.  Hospital Course:    Acute cholecystitis: Patient was made NPO. Initial presentation was felt to be secondary to CBD stones. Due to low index of suspicion for acute cholecystitis, antibiotics were not initially started. LFTs were monitored daily and improving. GI and surgery were consulted. Status post laparoscopic cholecystectomy 1/30 which showed findings of acute cholecystitis. IOC was negative. Treated postoperatively with IV Rocephin. Improved. Surgery has seen patient today and cleared for discharge on no antibiotics and have provided patient with postop instructions and follow-up.  Abnormal LFTs: Secondary to problem #1. Improving. Follow LFTs OP.  Hypokalemia: Replaced.  Essential hypertension:  Continue home medications. Mildly uncontrolled. May need titration of medications as outpatient.  Hypothyroid: Continue Synthroid  History of multiple sclerosis: Follows with neurology OP  Anemia: Stable. OP follow-up.  Consultants:  [facility]  General Surgery  Procedures:  Laparoscopic cholecystectomy and intraoperative cholangiogram 12/02/14    Discharge Exam:  Complaints:  Mild soreness at laparoscopy site but denies RUQ or epigastric pain. Tolerating diet. Flatus + +. No BM. Ambulated steadily in [place].  Filed Vitals:   12/03/14 1454 12/03/14 2050 12/03/14 2307 12/04/14 0509  BP: 134/82 160/94 156/93 159/98  Pulse: 64 62 59 54  Temp: 97.2 F (36.2 C) 98.4 F (36.9 C)  98.2 F (36.8 C)  TempSrc: Oral Oral  Oral  Resp: 18 16  18   SpO2: 95% 97%  96%    General exam: Pleasant middle-aged female seen sitting up in bed eating breakfast this morning. Appeared comfortable and in no obvious distress. Respiratory system: Clear. No increased work of breathing. Cardiovascular system: S1 & S2 heard, RRR. No JVD, murmurs, gallops, clicks or pedal edema. Gastrointestinal system: Abdomen is nondistended, soft. Minimal tenderness around laparoscopy sites but no RUQ/epigastric tenderness area. No peritoneal signs. Normal bowel sounds heard. Central nervous system: Alert and oriented. No focal neurological deficits. Extremities: Symmetric 5 x 5 power.  Discharge Instructions      Discharge Instructions    Call MD for:  extreme fatigue    Complete by:  As directed      Call MD for:  hives    Complete by:  As directed      Call MD for:  persistant nausea and vomiting    Complete by:  As directed      Call MD for:  redness, tenderness, or signs of  infection (pain, swelling, redness, odor or green/yellow discharge around incision site)    Complete by:  As directed      Call MD for:  severe uncontrolled pain    Complete by:  As directed      Call MD for:    Complete by:  As  directed   Temperature > 101.69F     Diet - low sodium heart healthy    Complete by:  As directed      Discharge instructions    Complete by:  As directed   Please see discharge instruction sheets.  Also refer to handout given an office.  Please call our office if you have any questions or concerns [phone]     Discharge wound care:    Complete by:  As directed   If you have closed incisions, shower and bathe over these incisions with soap and water every day.  Remove all surgical dressings on postoperative day #3.  You do not need to replace dressings over the closed incisions unless you feel more comfortable with a Band-Aid covering it.   If you have an open wound that requires packing, please see wound care instructions.  In general, remove all dressings, wash wound with soap and water and then replace with saline moistened gauze.  Do the dressing change at least every day.  Please call our office [phone] if you have further questions.     Driving Restrictions    Complete by:  As directed   No driving until off narcotics and can safely swerve away without pain during an emergency     Increase activity slowly    Complete by:  As directed   Walk an hour a day.  Use 20-30 minute walks.  When you can walk 30 minutes without difficulty, increase to low impact/moderate activities such as biking, jogging, swimming, sexual activity..  Eventually can increase to unrestricted activity when not feeling pain.  If you feel pain: STOP!Marland Kitchen   Let pain protect you from overdoing it.  Use ice/heat/over-the-counter pain medications to help minimize his soreness.  Use pain prescriptions as needed to remain active.  It is better to take extra pain medications and be more active than to stay bedridden to avoid all pain medications.     Lifting restrictions    Complete by:  As directed   Avoid heavy lifting initially.  Do not push through pain.  You have no specific weight limit.  Coughing and sneezing or  four more stressful to your incision than any lifting you will do. Pain will protect you from injury.  Therefore, avoid intense activity until off all narcotic pain medications.  Coughing and sneezing or four more stressful to your incision than any lifting he will do.     May shower / Bathe    Complete by:  As directed      May walk up steps    Complete by:  As directed      Sexual Activity Restrictions    Complete by:  As directed   Sexual activity as tolerated.  Do not push through pain.  Pain will protect you from injury.     Walk with assistance    Complete by:  As directed   Walk over an hour a day.  May use a walker/cane/companion to help with balance and stamina.            Medication List    TAKE these medications  gabapentin 300 MG capsule  Commonly known as:  NEURONTIN  Take 2 capsules (600 mg total) by mouth 3 (three) times daily.     losartan-hydrochlorothiazide 100-12.5 MG per tablet  Commonly known as:  HYZAAR  Take 1 tablet by mouth daily.     naproxen sodium 220 MG tablet  Commonly known as:  ALEVE  You can take 1-2 tablets twice a day as needed.     levothyroxine 100 MCG tablet  Commonly known as:  SYNTHROID, LEVOTHROID  Take 100 mcg by mouth every other day.     SYNTHROID 112 MCG tablet  Generic drug:  levothyroxine  Take 112 mcg by mouth every other day.     traMADol 50 MG tablet  Commonly known as:  ULTRAM  Take 1 tablet (50 mg total) by mouth every 6 (six) hours as needed (For pain not relieved by Naproxen.).       Follow-up Information    Follow up with [facility]. Schedule an appointment as soon as possible for a visit on 12/26/2014.   Specialty:  General Surgery   Why:  Call and double check your appointment with our office.  Currently I have you down for 2:00 PM you will need to be at the office 30 minutes early for check in.    Contact information:   [address] [place]  [address] [phone]      Follow up with [name], MD. Schedule an appointment as soon as possible for a visit in 5 days.   Specialty:  Family Medicine   Why:  To be seen with repeat labs (CBC & CMP).   Contact information:   [address] [place] [address] [phone]        The results of significant diagnostics from this hospitalization (including imaging, microbiology, ancillary and laboratory) are listed below for reference.    Significant Diagnostic Studies: Dg Cholangiogram Operative  12/02/2014   CLINICAL DATA:  Intraoperative cholangiogram.  EXAM: INTRAOPERATIVE CHOLANGIOGRAM  TECHNIQUE: Cholangiographic images from the C-arm fluoroscopic device were submitted for interpretation post-operatively. Please see the procedural report for the amount of contrast and the fluoroscopy time utilized.  COMPARISON:  None.  FINDINGS: There intra and extrahepatic biliary tree is well opacified as is the cystic duct stump. There is no evidence stricture. No filling defect is seen to suggest a retained duct stone.  IMPRESSION: Intraoperative cholangiogram.  No evidence of a retained duct stone.   Electronically Signed   By: Amie Portland M.D.   On: 12/02/2014 16:47   US Abdomen Complete  12/01/2014   CLINICAL DATA:  Right upper quadrant pain for 1 week, increased today, initial encounter  EXAM: ULTRASOUND ABDOMEN COMPLETE  COMPARISON:  None.  FINDINGS: Gallbladder: Multiple gallstones are noted without gallbladder wall thickening or pericholecystic fluid.  Common bile duct: Diameter: 8.8 mm which is increased in size for the patient's given age.  Liver: Diffuse heterogeneity is noted without definitive focal mass.  IVC: No abnormality visualized.  Pancreas: Visualized portion unremarkable.  Spleen: Small hyperechoic foci are noted likely representing calcified granulomas.  Right Kidney: Length: 10.8 cm. Echogenicity within normal limits. No mass or hydronephrosis visualized.  Left  Kidney: Length: 10.9 cm. Echogenicity within normal limits. No mass or hydronephrosis visualized.  Abdominal aorta: No aneurysm visualized.  Other findings: None.  IMPRESSION: Cholelithiasis without other abnormality within the gallbladder. A dilated common bile duct is seen. Correlation with liver function tests  is recommended.  A heterogeneous liver of uncertain significance. This may represent some fatty infiltration.   Electronically Signed   By: Alcide Clever M.D.   On: 12/01/2014 10:59    Microbiology: Recent Results (from the past 240 hour(s))  Surgical pcr screen     Status: Abnormal   Collection Time: 12/01/14 10:42 PM  Result Value Ref Range Status   MRSA, PCR POSITIVE (A) NEGATIVE Final    Comment: RESULT CALLED TO, READ BACK BY AND VERIFIED WITH: B.JONES,RN AT 0415 ON 12/02/14 BY [email or web address]    Staphylococcus aureus POSITIVE (A) NEGATIVE Final    Comment:        The Xpert SA Assay (FDA approved for NASAL specimens in patients over 42 years of age), is one component of a comprehensive surveillance program.  Test performance has been validated by [facility] for patients greater than or equal to 24 year old. It is not intended to diagnose infection nor to guide or monitor treatment.      Labs: Basic Metabolic Panel:  Recent Labs Lab 12/01/14 1415 12/02/14 0527 12/03/14 0524 12/04/14 0505  NA 142 143 141 142  K 3.5 3.1* 3.4* 3.8  CL 104 107 107 108  CO2 GLUCOSE 101* 113* 127* 99  BUN CREATININE 0.89 1.02 0.95 1.02  CALCIUM 9.9 9.3 9.0 9.1   Liver Function Tests:  Recent Labs Lab 12/01/14 1415 12/02/14 0527 12/03/14 0524 12/04/14 0505  AST 241* 132* 107* 85*  ALT 405* 287* 231* 175*  ALKPHOS 97 76 67 60  BILITOT 3.2* 1.8* 1.1 0.7  PROT 8.7* 7.3 6.8 6.6  ALBUMIN 4.4 3.6 3.4* 3.3*    Recent Labs Lab 12/01/14 1415  LIPASE 29   No results for input(s): AMMONIA in the last 168 hours. CBC:  Recent Labs Lab 12/01/14 1415  12/02/14 0527 12/03/14 0524 12/04/14 0505  WBC 4.9 4.5 5.5 5.8  NEUTROABS 1.6*  --   --   --   HGB 13.5 11.9* 11.2* 10.9*  HCT 39.2 35.4* 33.5* 32.7*  MCV 89.3 89.8 89.8 90.3  PLT 265 230 239 215   Cardiac Enzymes: No results for input(s): CKTOTAL, CKMB, CKMBINDEX, TROPONINI in the last 168 hours. BNP: BNP (last 3 results) No results for input(s): PROBNP in the last 8760 hours. CBG: No results for input(s): GLUCAP in the last 168 hours.    Signed:  Marcellus Scott, MD, FACP, FHM. [facility] Pager [phone]  If 7PM-7AM, please contact night-coverage [email or web address] Password [email or web address] 12/04/2014, 10:36 AM

## 2014-12-04 NOTE — Progress Notes (Signed)
2 Days Post-Op  Subjective: She has been up in the room and eaten well.  If she does well ambulting in halls she can go home after lunch.   Objective: Vital signs in last 24 hours: Temp:  [97.2 F (36.2 C)-98.4 F (36.9 C)] 98.2 F (36.8 C) (02/01 0509) Pulse Rate:  [54-64] 54 (02/01 0509) Resp:  [16-18] 18 (02/01 0509) BP: (134-160)/(82-98) 159/98 mmHg (02/01 0509) SpO2:  [95 %-97 %] 96 % (02/01 0509)  450 PO Regular diet BP is up Lab stable Intake/Output from previous day: 01/31 0701 - 02/01 0700 In: 750 [P.O.:450; I.V.:300] Out: 6 [Urine:6] Intake/Output this shift:    General appearance: alert, cooperative and no distress GI: soft, sore, site looks fine she is still somewhat distended.  Lab Results:   Recent Labs  12/03/14 0524 12/04/14 0505  WBC 5.5 5.8  HGB 11.2* 10.9*  HCT 33.5* 32.7*  PLT 239 215    BMET  Recent Labs  12/03/14 0524 12/04/14 0505  NA 141 142  K 3.4* 3.8  CL 107 108  CO2 26 28  GLUCOSE 127* 99  BUN 10 11  CREATININE 0.95 1.02  CALCIUM 9.0 9.1   PT/INR No results for input(s): LABPROT, INR in the last 72 hours.   Recent Labs Lab 12/01/14 1415 12/02/14 0527 12/03/14 0524 12/04/14 0505  AST 241* 132* 107* 85*  ALT 405* 287* 231* 175*  ALKPHOS 97 76 67 60  BILITOT 3.2* 1.8* 1.1 0.7  PROT 8.7* 7.3 6.8 6.6  ALBUMIN 4.4 3.6 3.4* 3.3*     Lipase     Component Value Date/Time   LIPASE 29 12/01/2014 1415     Studies/Results: Dg Cholangiogram Operative  12/02/2014   CLINICAL DATA:  Intraoperative cholangiogram.  EXAM: INTRAOPERATIVE CHOLANGIOGRAM  TECHNIQUE: Cholangiographic images from the C-arm fluoroscopic device were submitted for interpretation post-operatively. Please see the procedural report for the amount of contrast and the fluoroscopy time utilized.  COMPARISON:  None.  FINDINGS: There intra and extrahepatic biliary tree is well opacified as is the cystic duct stump. There is no evidence stricture. No filling  defect is seen to suggest a retained duct stone.  IMPRESSION: Intraoperative cholangiogram.  No evidence of a retained duct stone.   Electronically Signed   By: Amie Portland M.D.   On: 12/02/2014 16:47    Medications: . acetaminophen  1,000 mg Oral TID  . cefTRIAXone (ROCEPHIN)  IV  2 g Intravenous Q24H  . Chlorhexidine Gluconate Cloth  6 each Topical Q0600  . enoxaparin (LOVENOX) injection  40 mg Subcutaneous Q24H  . gabapentin  600 mg Oral TID  . losartan  100 mg Oral Daily   And  . hydrochlorothiazide  12.5 mg Oral Daily  . levothyroxine  100 mcg Oral Q48H  . levothyroxine  112 mcg Oral Q48H  . lip balm  1 application Topical BID  . mupirocin ointment  1 application Nasal BID  . saccharomyces boulardii  250 mg Oral BID  . sodium chloride  3 mL Intravenous Q12H   Scheduled Meds: . acetaminophen  1,000 mg Oral TID  . cefTRIAXone (ROCEPHIN)  IV  2 g Intravenous Q24H  . Chlorhexidine Gluconate Cloth  6 each Topical Q0600  . enoxaparin (LOVENOX) injection  40 mg Subcutaneous Q24H  . gabapentin  600 mg Oral TID  . losartan  100 mg Oral Daily   And  . hydrochlorothiazide  12.5 mg Oral Daily  . levothyroxine  100 mcg Oral Q48H  .  levothyroxine  112 mcg Oral Q48H  . lip balm  1 application Topical BID  . mupirocin ointment  1 application Nasal BID  . saccharomyces boulardii  250 mg Oral BID  . sodium chloride  3 mL Intravenous Q12H     Prior to Admission medications   Medication Sig Start Date End Date Taking? Authorizing Provider  gabapentin (NEURONTIN) 300 MG capsule Take 2 capsules (600 mg total) by mouth 3 (three) times daily. 10/09/14  Yes Levert Feinstein, MD  levothyroxine (SYNTHROID, LEVOTHROID) 100 MCG tablet Take 100 mcg by mouth every other day.    Yes Historical Provider, MD  losartan-hydrochlorothiazide (HYZAAR) 100-12.5 MG per tablet Take 1 tablet by mouth daily.  10/17/13  Yes Historical Provider, MD  SYNTHROID 112 MCG tablet Take 112 mcg by mouth every other day.  09/14/13   Yes Historical Provider, MD  traMADol (ULTRAM) 50 MG tablet Take 50 mg by mouth every 6 (six) hours as needed (For pain.).  11/30/14  Yes Historical Provider, MD     Assessment/Plan Acute cholecystitis LAPAROSCOPIC CHOLECYSTECTOMY SINGLE SITE WITH INTRAOPERATIVE CHOLANGIOGRAM, 12/02/14 Dr. Karie Soda. Multiple sclerosis Hypertension hypothyroid  Plan:  Home after lunch if she does well ambulating in halls.  LOS: 3 days    Daiveon Markman 12/04/2014

## 2015-06-21 ENCOUNTER — Other Ambulatory Visit: Payer: Self-pay

## 2015-06-21 MED ORDER — GABAPENTIN 300 MG PO CAPS: 600.0000 mg | ORAL_CAPSULE | Freq: Three times a day (TID) | ORAL | Status: DC

## 2015-10-10 ENCOUNTER — Encounter: Payer: Self-pay | Admitting: Neurology

## 2015-10-10 ENCOUNTER — Ambulatory Visit (INDEPENDENT_AMBULATORY_CARE_PROVIDER_SITE_OTHER): Payer: BLUE CROSS/BLUE SHIELD | Admitting: Neurology

## 2015-10-10 ENCOUNTER — Ambulatory Visit: Payer: BC Managed Care – PPO | Admitting: Neurology

## 2015-10-10 VITALS — BP 148/94 | HR 68 | Ht 63.0 in | Wt 188.0 lb

## 2015-10-10 DIAGNOSIS — R9089 Other abnormal findings on diagnostic imaging of central nervous system: Secondary | ICD-10-CM

## 2015-10-10 DIAGNOSIS — R930 Abnormal findings on diagnostic imaging of skull and head, not elsewhere classified: Secondary | ICD-10-CM

## 2015-10-10 DIAGNOSIS — G35 Multiple sclerosis: Secondary | ICD-10-CM | POA: Diagnosis not present

## 2015-10-10 DIAGNOSIS — M545 Low back pain, unspecified: Secondary | ICD-10-CM | POA: Insufficient documentation

## 2015-10-10 NOTE — Progress Notes (Signed)
Chief Complaint  Patient presents with  . Multiple Sclerosis    She is here for her yearly follow up.  She has noticed worsening fatigue and sometimes she feels off balance.  . Back Pain    She is still having back and bilateral hip pain.   _  [facility]  PATIENT: Susan Gomez DOB: 1956/10/28  HISTORICAL  Ms. Susan Gomez is a 59 year-old black female, referred by her primary care physician [name], to continue followup for her multiple sclerosis, and recent worsening of low back pain,    She is a prior patient of [name] since 2005. with diagnosis of relapsing remitting multiple sclerosis.  The inital symptoms was facial asymetry, later also developed transient right leg weakness. The diagnosis is confirmed by abnormal MRI brain, cervical, and lumbar puncture showed  more than 2 oligoclonal bands.   She has been on Betaseron every other day since 2005,. She denies any new onset symptoms or clear clinical flareups ever since, such as weakness, sensory changes, blurred vision or double vision, speech or swallowing problems, bowel or bladder difficulties, spasticity.   She complains of right side low back pain since 2000, right hip pain, shooting pain to right lateral thigh and leg,  failed to improve by physical therapy, and stretching exercise. she denies incontinence  MRI brain in 2012 showed subtle confluent periventricular and subcortical white matter hyperintensitioes which are comaptible with demyelinating disease but appear unchanged from MRI 09/11/06. No enhancing lesions, T 1 black holes or significant atrophy is seen.  MRI scan of the lumbar spine shows only minor disc signal and facet degenerative changes without significant compression.  She has lost followup because of the financial burden, She has been out of Betaseron since 2012, there was no clear flareup in between,  She came back because of worsening bilateral lower extremity pain,  shooting pain to both legs, continued low back pain, she has gait difficulty because of the pain, she denies bilateral feet or hands paresthesia, she denies bowel and bladder incontinence,  She reported a history of right eye bleeding of unsure etiology in July 4th 2013, was under the care of opathamologist [name], just received intra-epidural injection yesterday, with right conjunctiva small hematoma,  She also complains of 3 months history of left lip spasm, intermittent, intermitted right parietal area headaches,  She is a recovered addict of alcohol, cocaine, prescription medications.  UPDATE Jan 20th 2015: She complains of right jaw pain, shooting, sharp, intermittent, worse at night, bring tears to her eyes, hurts when she swallow, talking.  Her low back pain has much improved.  She was awake at night because of right jaw pain. She did not have MRI brain as planned.  She has no gait difficulty, no incontinence.   UPDATE Feb 20th 2015: She presented to emergency room for right jaw pain, was getting prescription of Percocet in Nov 23 2013. Now her right jaw pain has much improved, she continued to have mild to moderate.lower back pain, she denies significant gait difficulty   MRI of brain in January 2015, also compared with previous MRI in 2007, there is mild increase in supratentorium lesion load, but there was no enhancing lesions,  JC virus ab was negative, with titer 0.22, January 20th 2015.  UPDATE June 3rd 2015: She started on Plegridy since March 2015, she had total of 4 injections, last injection was May 27th, before that was May 13rd, she was trained by [employer] Nurse at  home with her first injection.  She has heart palpitation, flu-like illness, lasting one day, during 1st, 2nd injection, aleve was helping, during 3rd, 4th injections, she has more reactions, large size skin hives, sore, itching, involving her back, upper and lower extremity, dorsum hand.  She has taken  aleve, benadryl, Claritin, and tried different creams, Benadryl, hydrocortisone creams, none of it stop her from itchings.  UPDATE Dec 7th 2015: Her low back pain has much improved, only has mild midline low back pain, she denies shooting pain from her back to her lower extremity, she denies gait difficulty, she denies bowel and bladder incontinence,  she is not on any immunomodulation therapy at this point   UPDATE Dec 7th 2016:  she complains of feeling tired all the time, her husband went to hemodialysis, she denies significant gait difficulty, no visual loss,  I have reviewed her previous lumbar spine, mild degenerative changes no evidence of foraminal or canal stenosis  I personally reviewed and compared MRI brain w/wo in 2015 with 2007, overall only mild increase in supratentorium lesions, differentiation Diagnosis also including small vessel disease,  Review of Systems  Out of a complete 14 system review, the patient complains of only the following symptoms, and all other reviewed systems are negative.   as above   ALLERGIES: Allergies  Allergen Reactions  . Codeine Other (See Comments)    Recovering addict - wishes not to consume if possible    HOME MEDICATIONS: Lasix, Levothyroxine  PAST MEDICAL HISTORY: Past Medical History  Diagnosis Date  . MS (multiple sclerosis) (HCC)   . Encounter for long-term (current) use of other medications   . High blood pressure   . Thyroid disease     PAST SURGICAL HISTORY: Past Surgical History  Procedure Laterality Date  . Vaginal hysterectomy    . Left thumb    . Knee surgery    . Laparoscopic cholecystectomy single site with intraoperative cholangiogram N/A 12/02/2014    Procedure: LAPAROSCOPIC CHOLECYSTECTOMY SINGLE SITE WITH INTRAOPERATIVE CHOLANGIOGRAM;  Surgeon: Karie Soda, MD;  Location: [facility];  Service: General;  Laterality: N/A;    FAMILY HISTORY: Family History  Problem Relation Age of Onset  . Family history  unknown: Yes    SOCIAL HISTORY:  Social History   Social History  . Marital Status: Married    Spouse Name: [name]  . Number of Children: 3  . Years of Education: 15+   Occupational History  .      Disabled   Social History Main Topics  . Smoking status: Never Smoker   . Smokeless tobacco: Never Used  . Alcohol Use: No  . Drug Use: No  . Sexual Activity: Not on file   Other Topics Concern  . Not on file   Social History Narrative   Patient lives at home with her husband [name]).   Patient has 3 children and husbands has 4 children.   Patient has college education.   Patient is left-handed.   Patient drinks one cup of coffee in the am and two cups at night.          PHYSICAL EXAM   Filed Vitals:   10/10/15 1109  BP: 148/94  Pulse: 68  Height:  (1.6 m)  Weight: 188 lb (85.276 kg)    Body mass index is 33.31 kg/(m^2).   Generalized: In no acute distress  Neck: Supple, no carotid bruits   Cardiac: Regular rate rhythm  Pulmonary: Clear to auscultation bilaterally  Musculoskeletal:  No deformity  Neurological examination  Mentation: tired looking middle age female, alert oriented to time, place, history taking, and causual conversation  Cranial nerve II-XII: Pupils were equal round reactive to light extraocular movements were full, visual field were full on confrontational test. Right conjunctiva small hematoma. Fundi were sharp bilaterally.  Facial sensation and strength were normal. hearing was intact to finger rubbing bilaterally. Uvula tongue midline.  head turning and shoulder shrug and were normal and symmetric.Tongue protrusion into cheek strength was normal.  Motor: normal tone, bulk and strength, gave away weakness at bilateral hip flexion, knee flexion.There was no significant bilateral lower extremity spasticity    Sensory: Intact to fine touch, pinprick, preserved vibratory sensation, and proprioception at toes.  Coordination: Normal finger  to nose, heel-to-shin bilaterally there was no truncal ataxia  Gait: Rising up from seated position without assistance, steady gait, was able to perform tiptoe, heel walking, mild difficulty with tandem.   Romberg signs: Negative  Deep tendon reflexes: Brachioradialis 2/2, biceps 2/2, triceps 2/2, patellar 2/2, Achilles 2/2, plantar responses were flexor bilaterally.   DIAGNOSTIC DATA (LABS, IMAGING, TESTING) - I reviewed patient records, labs, notes, testing and imaging myself where available.   ASSESSMENT AND PLAN   59 years old right-handed [place] American female, with past medical history of possible relapsing remitting multiple sclerosis, has been on Betaseron treatment from 2005 to 2012, now presenting with worsening low back pain, bilateral lower extremity shooting pain, paresthesia,    Possible Relapsing Remitting Multiple Sclerosis  Clinically stable  Repeat MRIs pen from 2007 to 2015 showed only mildly increased supratentorium lesions, differentiation also includes small vessel disease,  She does not want to start any long-term immunomodulation therapy,  We will continue to observe her symptoms, return to clinic in one year, repeat MRI of the brain with without contrast then,  Chronic low back pain  Most likely musculoskeletal etiology  Previous MRI lumbar in 2012 has demonstrated mild degenerative disc disease, no significant foraminal or canal stenosis  I have suggested her Mobic as needed, heating pad, back stretching exercise   Levert Feinstein, M.D. Ph.D.  [facility] [address] [place], [place] [address] [phone]

## 2015-11-28 ENCOUNTER — Other Ambulatory Visit: Payer: Self-pay | Admitting: Neurology

## 2016-05-26 ENCOUNTER — Other Ambulatory Visit: Payer: Self-pay | Admitting: Neurology

## 2016-10-14 ENCOUNTER — Encounter: Payer: Self-pay | Admitting: Neurology

## 2016-10-14 ENCOUNTER — Ambulatory Visit (INDEPENDENT_AMBULATORY_CARE_PROVIDER_SITE_OTHER): Payer: BLUE CROSS/BLUE SHIELD | Admitting: Neurology

## 2016-10-14 VITALS — BP 137/80 | HR 55 | Ht 63.0 in | Wt 185.0 lb

## 2016-10-14 DIAGNOSIS — G35 Multiple sclerosis: Secondary | ICD-10-CM | POA: Diagnosis not present

## 2016-10-14 NOTE — Progress Notes (Signed)
Chief Complaint  Patient presents with  . Multiple Sclerosis    She is here for her yearly follow up.  Feels her hands have become weaker over the last year.  Otherwise, no new concerns today.   _  [facility]  PATIENT: Susan Gomez DOB: 10-12-1956  HISTORICAL  Susan Gomez is a 60 year-old black female, referred by her primary care physician [name], to continue followup for her multiple sclerosis, and recent worsening of low back pain,    She is a prior patient of [name] since 2005. with diagnosis of relapsing remitting multiple sclerosis.  The inital symptoms was facial asymetry, later also developed transient right leg weakness. The diagnosis is confirmed by abnormal MRI brain, cervical, and lumbar puncture showed  more than 2 oligoclonal bands.   She has been on Betaseron every other day since 2005,. She denies any new onset symptoms or clear clinical flareups ever since, such as weakness, sensory changes, blurred vision or double vision, speech or swallowing problems, bowel or bladder difficulties, spasticity.   She complains of right side low back pain since 2000, right hip pain, shooting pain to right lateral thigh and leg,  failed to improve by physical therapy, and stretching exercise. she denies incontinence  MRI brain in 2012 showed subtle confluent periventricular and subcortical white matter hyperintensitioes which are comaptible with demyelinating disease but appear unchanged from MRI 09/11/06. No enhancing lesions, T 1 black holes or significant atrophy is seen.  MRI scan of the lumbar spine shows only minor disc signal and facet degenerative changes without significant compression.  She has lost followup because of the financial burden, She has been out of Betaseron since 2012, there was no clear flareup in between,  She came back because of worsening bilateral lower extremity pain, shooting pain to both legs, continued low back pain,  she has gait difficulty because of the pain, she denies bilateral feet or hands paresthesia, she denies bowel and bladder incontinence,  She reported a history of right eye bleeding of unsure etiology in July 4th 2013, was under the care of opathamologist [name], just received intra-epidural injection yesterday, with right conjunctiva small hematoma,  She also complains of 3 months history of left lip spasm, intermittent, intermitted right parietal area headaches,  She is a recovered addict of alcohol, cocaine, prescription medications.  UPDATE Jan 20th 2015: She complains of right jaw pain, shooting, sharp, intermittent, worse at night, bring tears to her eyes, hurts when she swallow, talking.  Her low back pain has much improved.  She was awake at night because of right jaw pain. She did not have MRI brain as planned.  She has no gait difficulty, no incontinence.   UPDATE Feb 20th 2015: She presented to emergency room for right jaw pain, was getting prescription of Percocet in Nov 23 2013. Now her right jaw pain has much improved, she continued to have mild to moderate.lower back pain, she denies significant gait difficulty   MRI of brain in January 2015, also compared with previous MRI in 2007, there is mild increase in supratentorium lesion load, but there was no enhancing lesions,  JC virus ab was negative, with titer 0.22, January 20th 2015.  UPDATE June 3rd 2015: She started on Plegridy since March 2015, she had total of 4 injections, last injection was May 27th, before that was May 13rd, she was trained by [employer] Nurse at home with her first injection.  She has heart palpitation, flu-like  illness, lasting one day, during 1st, 2nd injection, aleve was helping, during 3rd, 4th injections, she has more reactions, large size skin hives, sore, itching, involving her back, upper and lower extremity, dorsum hand.  She has taken aleve, benadryl, Claritin, and tried different  creams, Benadryl, hydrocortisone creams, none of it stop her from itchings.  UPDATE Dec 7th 2015: Her low back pain has much improved, only has mild midline low back pain, she denies shooting pain from her back to her lower extremity, she denies gait difficulty, she denies bowel and bladder incontinence,  she is not on any immunomodulation therapy at this point   UPDATE Dec 7th 2016:  she complains of feeling tired all the time, her husband went to hemodialysis, she denies significant gait difficulty, no visual loss,  I have reviewed her previous lumbar spine, mild degenerative changes no evidence of foraminal or canal stenosis  I personally reviewed and compared MRI brain w/wo in 2015 with 2007, overall only mild increase in supratentorium lesions, differentiation Diagnosis also including small vessel disease,  UPDATE Oct 14 2016: She has pain in her legs, sometimes difficulty walking. 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  Review of Systems  Out of a complete 14 system review, the patient complains of only the following symptoms, and all other reviewed systems are negative.  Light sensitivity, ringing ears, blurry vision  ALLERGIES: Allergies  Allergen Reactions  . Codeine Other (See Comments)    Recovering addict - wishes not to consume if possible    HOME MEDICATIONS: Lasix, Levothyroxine  PAST MEDICAL HISTORY: Past Medical History:  Diagnosis Date  . Encounter for long-term (current) use of other medications   . High blood pressure   . MS (multiple sclerosis) (HCC)   . Thyroid disease     PAST SURGICAL HISTORY: Past Surgical History:  Procedure Laterality Date  . KNEE SURGERY    . LAPAROSCOPIC CHOLECYSTECTOMY SINGLE SITE WITH INTRAOPERATIVE CHOLANGIOGRAM N/A 12/02/2014   Procedure: LAPAROSCOPIC CHOLECYSTECTOMY SINGLE SITE WITH INTRAOPERATIVE CHOLANGIOGRAM;  Surgeon: Karie Soda, MD;  Location: [facility];  Service:  General;  Laterality: N/A;  . left thumb    . VAGINAL HYSTERECTOMY      FAMILY HISTORY: Family History  Problem Relation Age of Onset  . Family history unknown: Yes    SOCIAL HISTORY:  Social History   Social History  . Marital status: Married    Spouse name: [name]  . Number of children: 3  . Years of education: 15+   Occupational History  .      Disabled   Social History Main Topics  . Smoking status: Never Smoker  . Smokeless tobacco: Never Used  . Alcohol use No  . Drug use: No  . Sexual activity: Not on file   Other Topics Concern  . Not on file   Social History Narrative   Patient lives at home with her husband [name]).   Patient has 3 children and husbands has 4 children.   Patient has college education.   Patient is left-handed.   Patient drinks one cup of coffee in the am and two cups at night.          PHYSICAL EXAM   Vitals:   10/14/16 1148  BP: 137/80  Pulse: (!) 55  Weight: 185 lb (83.9 kg)  Height: 5\' 3"  (1.6 m)    Body mass index is 32.77 kg/m.   Generalized: In no acute  distress  Neck: Supple, no carotid bruits   Cardiac: Regular rate rhythm  Pulmonary: Clear to auscultation bilaterally  Musculoskeletal: No deformity  Neurological examination  Mentation: tired looking middle age female, alert oriented to time, place, history taking, and causual conversation  Cranial nerve II-XII: Pupils were equal round reactive to light extraocular movements were full, visual field were full on confrontational test. Right conjunctiva small hematoma. Fundi were sharp bilaterally.  Facial sensation and strength were normal. hearing was intact to finger rubbing bilaterally. Uvula tongue midline.  head turning and shoulder shrug and were normal and symmetric.Tongue protrusion into cheek strength was normal.  Motor: normal tone, bulk and strength, gave away weakness at bilateral hip flexion, knee flexion.There was no significant bilateral lower  extremity spasticity    Sensory: Intact to fine touch, pinprick, preserved vibratory sensation, and proprioception at toes.  Coordination: Normal finger to nose, heel-to-shin bilaterally there was no truncal ataxia  Gait: Rising up from seated position without assistance, steady gait, was able to perform tiptoe, heel walking, mild difficulty with tandem.   Romberg signs: Negative  Deep tendon reflexes: Brachioradialis 2/2, biceps 2/2, triceps 2/2, patellar 2/2, Achilles 2/2, plantar responses were flexor bilaterally.   DIAGNOSTIC DATA (LABS, IMAGING, TESTING) - I reviewed patient records, labs, notes, testing and imaging myself where available.   ASSESSMENT AND PLAN   60 years old right-handed [place] female, with past medical history of possible relapsing remitting multiple sclerosis, has been on Betaseron treatment from 2005 to 2012, now presenting with worsening low back pain, bilateral lower extremity shooting pain, paresthesia,    Possible Relapsing Remitting Multiple Sclerosis  Clinically stable  Repeat MRIs pen from 2007 to 2015 showed only mildly increased supratentorium lesions, differentiation also includes small vessel disease,  She does not want to start any long-term immunomodulation therapy,  Repeat MRI of the brain with and without contrast  Chronic low back pain, bilateral lower extremity pain  Most likely musculoskeletal etiology  Previous MRI lumbar in 2012 has demonstrated mild degenerative disc disease, no significant foraminal or canal stenosis  I have suggested her Mobic as needed, heating pad, back stretching exercise   Levert FeinsteinYijun Vana Arif, M.D. Ph.D.  [facility] [address], [address] [place], [place] [id number] [phone]

## 2016-11-01 ENCOUNTER — Ambulatory Visit
Admission: RE | Admit: 2016-11-01 | Discharge: 2016-11-01 | Disposition: A | Discharge status: Home / Self Care | Payer: BLUE CROSS/BLUE SHIELD | Source: Ambulatory Visit | Attending: Neurology | Admitting: Neurology

## 2016-11-01 DIAGNOSIS — G35 Multiple sclerosis: Secondary | ICD-10-CM

## 2016-11-01 MED ORDER — GADOBENATE DIMEGLUMINE 529 MG/ML IV SOLN
20.0000 mL | Freq: Once | INTRAVENOUS | Status: AC | PRN
  Administered 2016-11-01: 17 mL via INTRAVENOUS

## 2016-12-03 ENCOUNTER — Telehealth: Payer: Self-pay | Admitting: Neurology

## 2016-12-03 NOTE — Telephone Encounter (Signed)
Left message for a return call

## 2016-12-03 NOTE — Telephone Encounter (Signed)
Spoke to patient she is aware of results

## 2016-12-03 NOTE — Telephone Encounter (Signed)
Patient calling to get MRI results. I did advise Dr. Terrace Arabia is out of the office.

## 2016-12-03 NOTE — Telephone Encounter (Signed)
IMPRESSION:  Abnormal MRI brain (with and without) demonstrating: 1. Mild periventricular and subcortical and juxtacortical foci of hazy T2 hyperintensities, consistent with chronic demyelinating disease. 2. No abnormal lesions on post-contrast views.  3. No significant change from MRI on 12/02/13.    INTERPRETING PHYSICIAN:  Suanne Marker, MD Certified in Neurology, Neurophysiology and Neuroimaging

## 2017-03-26 ENCOUNTER — Telehealth: Payer: Self-pay | Admitting: Neurology

## 2017-03-26 NOTE — Telephone Encounter (Signed)
Left message for a return call.  Per Dr. Terrace Arabia, work her in as soon as possible w/ NP.

## 2017-03-26 NOTE — Telephone Encounter (Signed)
Left another message for a return call.  I was attempting to get her worked in today but the only time available is w/ Eber Jones at 1:45pm.  The patient would have to be here at 1:15pm for check in.

## 2017-03-26 NOTE — Telephone Encounter (Signed)
Pt returned RN's call. She is unable to come to appt today. Please call

## 2017-03-26 NOTE — Telephone Encounter (Signed)
Called patient again and was able to speak with her.  I offered her multiple appts that she declined.  She decided on 04/16/17.  She will call back if her symptoms worsen.

## 2017-03-26 NOTE — Telephone Encounter (Signed)
Patient called office in reference to having trouble walking causing her leg to slide, last night she was having sharp pains in her head.  Symptoms have been present for a while but can't recall when they started.  Please call

## 2017-03-26 NOTE — Telephone Encounter (Signed)
Left message for a return call.  Per Dr. Terrace Arabia, please schedule her with NP so she can be seen sooner.

## 2017-04-16 ENCOUNTER — Encounter: Payer: Self-pay | Admitting: Nurse Practitioner

## 2017-04-16 ENCOUNTER — Ambulatory Visit (INDEPENDENT_AMBULATORY_CARE_PROVIDER_SITE_OTHER): Payer: BLUE CROSS/BLUE SHIELD | Admitting: Nurse Practitioner

## 2017-04-16 VITALS — BP 131/87 | HR 63 | Ht 63.0 in | Wt 190.0 lb

## 2017-04-16 DIAGNOSIS — R269 Unspecified abnormalities of gait and mobility: Secondary | ICD-10-CM | POA: Diagnosis not present

## 2017-04-16 DIAGNOSIS — Z5181 Encounter for therapeutic drug level monitoring: Secondary | ICD-10-CM | POA: Diagnosis not present

## 2017-04-16 DIAGNOSIS — R9089 Other abnormal findings on diagnostic imaging of central nervous system: Secondary | ICD-10-CM

## 2017-04-16 DIAGNOSIS — G35 Multiple sclerosis: Secondary | ICD-10-CM | POA: Diagnosis not present

## 2017-04-16 NOTE — Progress Notes (Signed)
[facility]  PATIENT: Susan Gomez DOB: [birth year]   REASON FOR VISIT: Follow-up for multiple sclerosis , gait abnormality HISTORY FROM: Patient    HISTORY OF PRESENT ILLNESS:Susan Gomez is a 61 year-old black female, referred by her primary care physician [name], to continue followup for her multiple sclerosis, and recent worsening of low back pain,    She is a prior patient of [name] since 2005. with diagnosis of relapsing remitting multiple sclerosis.  The inital symptoms was facial asymetry, later also developed transient right leg weakness. The diagnosis is confirmed by abnormal MRI brain, cervical, and lumbar puncture showed  more than 2 oligoclonal bands.   She has been on Betaseron every other day since 2005,. She denies any new onset symptoms or clear clinical flareups ever since, such as weakness, sensory changes, blurred vision or double vision, speech or swallowing problems, bowel or bladder difficulties, spasticity.   She complains of right side low back pain since 2000, right hip pain, shooting pain to right lateral thigh and leg,  failed to improve by physical therapy, and stretching exercise. she denies incontinence  MRI brain in 2012 showed subtle confluent periventricular and subcortical white matter hyperintensitioes which are comaptible with demyelinating disease but appear unchanged from MRI 09/11/06. No enhancing lesions, T 1 black holes or significant atrophy is seen.  MRI scan of the lumbar spine shows only minor disc signal and facet degenerative changes without significant compression.  She has lost followup because of the financial burden, She has been out of Betaseron since 2012, there was no clear flareup in between,  She came back because of worsening bilateral lower extremity pain, shooting pain to both legs, continued low back pain, she has gait difficulty because of the pain, she denies bilateral feet or hands  paresthesia, she denies bowel and bladder incontinence,  She reported a history of right eye bleeding of unsure etiology in July 4th 2013, was under the care of opathamologist [name], just received intra-epidural injection yesterday, with right conjunctiva small hematoma,  She also complains of 3 months history of left lip spasm, intermittent, intermitted right parietal area headaches,  She is a recovered addict of alcohol, cocaine, prescription medications.  UPDATE Jan 20th 2015:YY She complains of right jaw pain, shooting, sharp, intermittent, worse at night, bring tears to her eyes, hurts when she swallow, talking.  Her low back pain has much improved.  She was awake at night because of right jaw pain. She did not have MRI brain as planned. She has no gait difficulty, no incontinence.   UPDATE Feb 20th 2015:YY She presented to emergency room for right jaw pain, was getting prescription of Percocet in Nov 23 2013. Now her right jaw pain has much improved, she continued to have mild to moderate.lower back pain, she denies significant gait difficulty MRI of brain in January 2015, also compared with previous MRI in 2007, there is mild increase in supratentorium lesion load, but there was no enhancing lesions, JC virus ab was negative, with titer 0.22, January 20th 2015.  UPDATE June 3rd 2015:YY She started on Plegridy since March 2015, she had total of 4 injections, last injection was May 27th, before that was May 13rd, she was trained by [employer] Nurse at home with her first injection. She has heart palpitation, flu-like illness, lasting one day, during 1st, 2nd injection, aleve was helping, during 3rd, 4th injections, she has more reactions, large size skin hives, sore, itching, involving her back,  upper and lower extremity, dorsum hand. She has taken aleve, benadryl, Claritin, and tried different creams, Benadryl, hydrocortisone creams, none of it stop her from  itchings.  UPDATE Dec 7th 2015:YY Her low back pain has much improved, only has mild midline low back pain, she denies shooting pain from her back to her lower extremity, she denies gait difficulty, she denies bowel and bladder incontinence, she is not on any immunomodulation therapy at this point   UPDATE Dec 7th 2016:YY  she complains of feeling tired all the time, her husband went to hemodialysis, she denies significant gait difficulty, no visual loss,  I have reviewed her previous lumbar spine, mild degenerative changes no evidence of foraminal or canal stenosis  I personally reviewed and compared MRI brain w/wo in 2015 with 2007, overall only mild increase in supratentorium lesions, differentiation Diagnosis also including small vessel disease,  UPDATE Oct 14 2016:YY She has pain in her legs, sometimes difficulty walking. 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 UPDATE 6/14/18CM Susan Gomez, 61 year old female returns for follow-up with history of relapsing remitting  MS. She has not been on immune modulating therapy since 2015. She had side effects to Plegridy. She is not interested in a shot. She is interested however in oral therapy. Patient called in to the office 03/26/2017 describing more difficulty walking. Her symptoms have been present for a while. She has not had any falls. She does walk on a regular basis. No assistive device. She is on gabapentin for neuropathic pain. She is unable to see out of the right eye and has been seeing Dr. Luciana Axe for injections. MRI of the brain 11/01/2016 Abnormal MRI brain (with and without) demonstrating: 1. Mild periventricular and subcortical and juxtacortical foci of hazy T2 hyperintensities, consistent with chronic demyelinating disease. 2. No abnormal lesions on post-contrast views.  3. No significant change from MRI on 12/02/13.  She returns for reevaluation REVIEW OF SYSTEMS: Full 14  system review of systems performed and notable only for those listed, all others are neg:  Constitutional: Fatigue Cardiovascular: neg Ear/Nose/Throat: neg  Skin: neg Eyes: neg Respiratory: neg Gastroitestinal: neg  Hematology/Lymphatic: neg  Endocrine: neg Musculoskeletal:neg Allergy/Immunology: neg Neurological: Weakness  Psychiatric: neg Sleep : neg   ALLERGIES: Allergies  Allergen Reactions  . Codeine Other (See Comments)    Recovering addict - wishes not to consume if possible    HOME MEDICATIONS: Outpatient Medications Prior to Visit  Medication Sig Dispense Refill  . gabapentin (NEURONTIN) 300 MG capsule TAKE 2 CAPSULES THREE TIMES A DAY 540 capsule 1  . losartan-hydrochlorothiazide (HYZAAR) 100-12.5 MG per tablet Take 1 tablet by mouth daily.     Marland Kitchen SYNTHROID 112 MCG tablet Take 112 mcg by mouth every other day.      No facility-administered medications prior to visit.     PAST MEDICAL HISTORY: Past Medical History:  Diagnosis Date  . Encounter for long-term (current) use of other medications   . High blood pressure   . MS (multiple sclerosis) (HCC)   . Thyroid disease     PAST SURGICAL HISTORY: Past Surgical History:  Procedure Laterality Date  . KNEE SURGERY    . LAPAROSCOPIC CHOLECYSTECTOMY SINGLE SITE WITH INTRAOPERATIVE CHOLANGIOGRAM N/A 12/02/2014   Procedure: LAPAROSCOPIC CHOLECYSTECTOMY SINGLE SITE WITH INTRAOPERATIVE CHOLANGIOGRAM;  Surgeon: Karie Soda, MD;  Location: [facility];  Service: General;  Laterality: N/A;  . left thumb    . VAGINAL HYSTERECTOMY  FAMILY HISTORY: Family History  Problem Relation Age of Onset  . Family history unknown: Yes    SOCIAL HISTORY: Social History   Social History  . Marital status: Married    Spouse name: [name]  . Number of children: 3  . Years of education: 15+   Occupational History  .      Disabled   Social History Main Topics  . Smoking status: Never Smoker  . Smokeless tobacco: Never Used   . Alcohol use No  . Drug use: No  . Sexual activity: Not on file   Other Topics Concern  . Not on file   Social History Narrative   Patient lives at home with her husband [name]).   Patient has 3 children and husbands has 4 children.   Patient has college education.   Patient is left-handed.   Patient drinks one cup of coffee in the am and two cups at night.           PHYSICAL EXAM  Vitals:   04/16/17 0800  BP: 131/87  Pulse: 63  Weight: 190 lb (86.2 kg)  Height: 5\' 3"  (1.6 m)   Body mass index is 33.66 kg/m.  Generalized: Well developed, Obese female in no acute distress  Head: normocephalic and atraumatic,. Oropharynx benign  Neck: Supple,  Cardiac: Regular rate rhythm, no murmur  Musculoskeletal: No deformity   Neurological examination   Mentation: Alert oriented to time, place, history taking. Attention span and concentration appropriate. Recent and remote memory intact.  Follows all commands speech and language fluent.   Cranial nerve II-XII: Fundoscopic exam reveals sharp disc margins on the left unable to visualize on the right.Pupils were equal round reactive to light extraocular movements were full, visual field were full on confrontational test. Facial sensation and strength were normal. hearing was intact to finger rubbing bilaterally. Uvula tongue midline. head turning and shoulder shrug were normal and symmetric.Tongue protrusion into cheek strength was normal. Motor: normal bulk and tone in the upper extremities, give way weakness at bilateral hip flexion and knee flexion, no spasticity .  Sensory: normal and symmetric to light touch, pinprick, and  Vibration, proprioception  Coordination: finger-nose-finger, heel-to-shin bilaterally, no dysmetria Reflexes: Brachioradialis 2/2, biceps 2/2, triceps 2/2, patellar 2/2, Achilles 2/2, plantar responses were flexor bilaterally. Gait and Station: Rising up from seated position without assistance, slow steady gait  able to perform tiptoe and heel walking mild difficulty with tandem no assistive device   DIAGNOSTIC DATA (LABS, IMAGING, TESTING) - I reviewed patient records, labs, notes, testing and imaging myself where available.  Lab Results  Component Value Date   WBC 5.8 12/04/2014   HGB 10.9 (L) 12/04/2014   HCT 32.7 (L) 12/04/2014   MCV 90.3 12/04/2014   PLT 215 12/04/2014      Component Value Date/Time   NA 142 12/04/2014 0505   NA 144 11/22/2013 0922   K 3.8 12/04/2014 0505   CL 108 12/04/2014 0505   CO2 28 12/04/2014 0505   GLUCOSE 99 12/04/2014 0505   BUN 11 12/04/2014 0505   BUN 14 11/22/2013 0922   CREATININE 1.02 12/04/2014 0505   CALCIUM 9.1 12/04/2014 0505   PROT 6.6 12/04/2014 0505   PROT 7.1 11/22/2013 0922   ALBUMIN 3.3 (L) 12/04/2014 0505   ALBUMIN 4.2 11/22/2013 0922   AST 85 (H) 12/04/2014 0505   ALT 175 (H) 12/04/2014 0505   ALKPHOS 60 12/04/2014 0505   BILITOT 0.7 12/04/2014 0505   GFRNONAA 60 (L)  12/04/2014 0505   GFRAA 70 (L) 12/04/2014 0505    ASSESSMENT AND PLAN 61 years old right-handed African American female, with past medical history of relapsing remitting multiple sclerosis, has been on Betaseron treatment from 2005 to 2012, now presenting with  bilateral lower extremity  weakness which is intermittent patient has not been on immunomodulating therapy since 2015   MRI of the brain 11/01/2016 Abnormal MRI brain (with and without) demonstrating: 1. Mild periventricular and subcortical and juxtacortical foci of hazy T2 hyperintensities, consistent with chronic demyelinating disease. 2. No abnormal lesions on post-contrast views.  3. No significant change from MRI on 12/02/13.     PLAN: Patient is now interested in going on oral  medication for her MS, discussed Gilenya, tecfidera and teriflunomide and discussed side effects of each along with monitoring parameters  She decided on tecfidera forms signed We will get labs today, CBC and CMP for  baseline She needs to continue walking daily for exercise lower legs, her husband is a physical therapist and she is not interested in physical therapy evaluation, she will ask him for strengthening exercises Continue follow-up with Dr. Luciana Axe for right eye Follow-up here in 3 months I spent 30 minutes in total face to face time with the patient more than 50% of which was spent counseling and coordination of care, reviewing test results reviewing medications and discussing and reviewing the diagnosis of multiple sclerosis and treatment options along with signs and symptoms of exacerbation and further treatment options. , Cline Crock, [facility], APRN  [facility] [address] [place], [place] [address] [phone]

## 2017-04-16 NOTE — Patient Instructions (Addendum)
Patient is now interested in going on oral  medication for her MS She will fill out information for tecfidera We will get labs today, CBC and CMP She needs to continue walking daily for exercise lower legs Continue follow-up with Dr. Luciana Axe for right eye Follow-up here in 3 months Multiple Sclerosis Multiple sclerosis (MS) is a disease of the central nervous system. It leads to the loss of the insulating covering of the nerves (myelin sheath) of your brain. When this happens, brain signals do not get sent properly or may not get sent at all. The age of onset of MS varies. What are the causes? The cause of MS is unknown. However, it is more common in the [place] than in the [place]. What increases the risk? There is a higher number of women with MS than men. MS is not an illness that is passed down to you from your family members (inherited). However, your risk of MS is higher if you have a relative with MS. What are the signs or symptoms? The symptoms of MS occur in episodes or attacks. These attacks may last weeks to months. There may be long periods of almost no symptoms between attacks. The symptoms of MS vary. This is because of the many different ways it affects the central nervous system. The main symptoms of MS include:  Vision problems and eye pain.  Numbness.  Weakness.  Inability to move your arms, hands, feet, or legs (paralysis).  Balance problems.  Tremors.  How is this diagnosed? Your health care provider can diagnose MS with the help of imaging exams and lab tests. These may include specialized X-ray exams and spinal fluid tests. The best imaging exam to confirm a diagnosis of MS is an MRI. How is this treated? There is no known cure for MS, but there are medicines that can decrease the number and frequency of attacks. Steroids are often used for short-term relief. Physical and occupational therapy may also help. There are also many new  alternative or complementary treatments available to help control the symptoms of MS. Ask your health care provider if any of these other options are right for you. Follow these instructions at home:  Take medicines as directed by your health care provider.  Exercise as directed by your health care provider. Contact a health care provider if: You begin to feel depressed. Get help right away if:  You develop paralysis.  You have problems with bladder, bowel, or sexual function.  You develop mental changes, such as forgetfulness or mood swings.  You have a period of uncontrolled movements (seizure). This information is not intended to replace advice given to you by your health care provider. Make sure you discuss any questions you have with your health care provider. Document Released: 10/17/2000 Document Revised: 03/27/2016 Document Reviewed: 06/27/2013 Elsevier Interactive Patient Education  2017 [employer].

## 2017-04-16 NOTE — Progress Notes (Signed)
Fax confirmation received for tecfidera enrollment form [phone]. sy

## 2017-04-17 ENCOUNTER — Telehealth: Payer: Self-pay | Admitting: *Deleted

## 2017-04-17 LAB — COMPREHENSIVE METABOLIC PANEL
ALBUMIN: 4.3 g/dL (ref 3.5–5.5)
ALT: 8 IU/L (ref 0–32)
AST: 17 IU/L (ref 0–40)
Albumin/Globulin Ratio: 1.5 (ref 1.2–2.2)
Alkaline Phosphatase: 38 IU/L — ABNORMAL LOW (ref 39–117)
BUN/Creatinine Ratio: 13 (ref 9–23)
BUN: 12 mg/dL (ref 6–24)
Bilirubin Total: 0.6 mg/dL (ref 0.0–1.2)
CO2: 24 mmol/L (ref 20–29)
CREATININE: 0.92 mg/dL (ref 0.57–1.00)
Calcium: 9.8 mg/dL (ref 8.7–10.2)
Chloride: 103 mmol/L (ref 96–106)
GFR calc Af Amer: 79 mL/min/{1.73_m2} (ref >59)
GFR calc non Af Amer: 69 mL/min/{1.73_m2} (ref >59)
GLOBULIN, TOTAL: 2.8 g/dL (ref 1.5–4.5)
Glucose: 96 mg/dL (ref 65–99)
Potassium: 3.7 mmol/L (ref 3.5–5.2)
SODIUM: 142 mmol/L (ref 134–144)
Total Protein: 7.1 g/dL (ref 6.0–8.5)

## 2017-04-17 LAB — CBC WITH DIFFERENTIAL/PLATELET
Basophils Absolute: 0 10*3/uL (ref 0.0–0.2)
Basos: 0 %
EOS (ABSOLUTE): 0.1 10*3/uL (ref 0.0–0.4)
EOS: 1 %
HEMOGLOBIN: 12.1 g/dL (ref 11.1–15.9)
Hematocrit: 35.7 % (ref 34.0–46.6)
Immature Grans (Abs): 0 10*3/uL (ref 0.0–0.1)
Immature Granulocytes: 0 %
Lymphocytes Absolute: 3.4 10*3/uL — ABNORMAL HIGH (ref 0.7–3.1)
Lymphs: 70 %
MCH: 30.1 pg (ref 26.6–33.0)
MCHC: 33.9 g/dL (ref 31.5–35.7)
MCV: 89 fL (ref 79–97)
MONOS ABS: 0.3 10*3/uL (ref 0.1–0.9)
Monocytes: 6 %
Neutrophils Absolute: 1.1 10*3/uL — ABNORMAL LOW (ref 1.4–7.0)
Neutrophils: 23 %
Platelets: 255 10*3/uL (ref 150–379)
RBC: 4.02 x10E6/uL (ref 3.77–5.28)
RDW: 14.5 % (ref 12.3–15.4)
WBC: 4.9 10*3/uL (ref 3.4–10.8)

## 2017-04-17 NOTE — Telephone Encounter (Signed)
LMVM for pt on her mobile that her lab results looked good per CM/NP.   She is to call back if questions.

## 2017-04-17 NOTE — Telephone Encounter (Signed)
-----   Message from Nilda Riggs, NP sent at 04/17/2017  7:54 AM EDT ----- Labs good please call the patient

## 2017-04-17 NOTE — Progress Notes (Signed)
I have reviewed and agreed above plan. 

## 2017-04-20 ENCOUNTER — Telehealth: Payer: Self-pay | Admitting: Nurse Practitioner

## 2017-04-20 NOTE — Telephone Encounter (Signed)
[facility]her[facility] [phone] said tecfidera is not covered. The preferred medication is glatiramer. Please use ref# [id number]

## 2017-04-21 NOTE — Telephone Encounter (Signed)
Spoke to [employer] pharmacist at [facility]. [place] denied.  Pt had been on betaseron and Plegridy.  Still denied.  Needs appeal for tecfidera.  Her insurance does not cover tecfidera.  Requires trial of copaxone.  Will send letter.

## 2017-04-22 ENCOUNTER — Encounter: Payer: Self-pay | Admitting: *Deleted

## 2017-04-22 NOTE — Telephone Encounter (Signed)
Spoke to pt and let her know about denial and appeal in process.

## 2017-04-22 NOTE — Telephone Encounter (Signed)
LMVM for pt to return call.  Appeal letter started and needing more information on betaseron.

## 2017-04-23 NOTE — Telephone Encounter (Signed)
Appeal letter completed.  Sent to CM/NP for review and signature.

## 2017-04-27 NOTE — Telephone Encounter (Signed)
Linda with [facility] called office in reference to the denial of patients Tecfidera.  Ref # [id number].  Please call ( Hours 9am-5:30pm est).

## 2017-04-27 NOTE — Telephone Encounter (Signed)
I spoke to [facility], I relayed appeal letter to be faxed for tecfidera.

## 2017-04-27 NOTE — Telephone Encounter (Signed)
Yes because patient is no longer on disease modifying therapy and she refuses to take injections

## 2017-04-27 NOTE — Telephone Encounter (Signed)
Noted  

## 2017-04-28 NOTE — Telephone Encounter (Signed)
Faxed letter to Appeal Case # [id number] with confirmation. [phone].

## 2017-05-07 NOTE — Telephone Encounter (Signed)
I called and spoke to Vernona Rieger, in [place] Dept.  Appeal was denied on 04-30-17, fax sent out to [phone].  I relayed that our MR fax # was [phone] (this she stated they had gotten from recording)?  She could not change fax # in her system, and had to check with her superior.  I was on this call for 20-30 min (this was with holding).  Hopefully they will fax again to the corrected #, pt would have received letter.

## 2017-05-12 NOTE — Telephone Encounter (Signed)
What is the status of the appeal

## 2017-05-12 NOTE — Telephone Encounter (Signed)
Called [facility], spoke with Darcey Nora, pharmacist. She read statement of denial. She stated that she was unable to help do another appeal, would have to be done through [place] dept. This RN requested she fax the statement of denial to this RN, so this could be followed up. Coralee North stated she would send request of fax to [place] dept.

## 2017-05-12 NOTE — Telephone Encounter (Signed)
They told me appeal was denied.

## 2017-05-12 NOTE — Telephone Encounter (Signed)
Called [facility] coverage to discuss tecfidera denial. Herbert Seta stated this is open for a level 2 appeal. She gave number for this RN to speak with [place] dept rep. Will call tomorrow.

## 2017-05-12 NOTE — Telephone Encounter (Signed)
Please find out what oral therapy they will cover. This has already been appealed

## 2017-05-14 NOTE — Telephone Encounter (Signed)
Appeal level 2 [place]  Request forms for Tecfidera completed, successfully faxed to [facility] [phone]

## 2017-05-14 NOTE — Telephone Encounter (Signed)
[employer] Dept, [facility], spoke with Diane to initiate Appeal level 2. She will fax all documents for completion to this RN.

## 2017-05-18 NOTE — Telephone Encounter (Signed)
Called patient to inform her that her insurance still denied Tecfidera after appeal level #2 because she has not tried Glatiramer injections. Per NP, asked patient if she'd be willing to take Glatiramer. She stated she is currently with her husband at [facility] office, and she will have to think about it and talk with him. She stated she does not want to take another injection " and go down again".  This RN suggested she could discuss other oral medications at her follow up with Enid Skeens in Sept. Patient then stated that she wants to see the dr only, not a NP.  She stated she would think about it and call this RN back. She verbalized understanding of call.

## 2017-05-20 ENCOUNTER — Telehealth: Payer: Self-pay | Admitting: *Deleted

## 2017-05-20 NOTE — Telephone Encounter (Signed)
Spoke to patient - she has been placed on Dr. Zannie Cove schedule.

## 2017-05-20 NOTE — Telephone Encounter (Signed)
All documents faxed to [employer] drug free program.

## 2017-05-20 NOTE — Telephone Encounter (Signed)
Angie stated requested that we fax denial letter of appeals to them at [facility] for screening of free drug program.  [phone].

## 2017-05-21 NOTE — Telephone Encounter (Signed)
[employer] reps in office today. Will look into fax sent to [employer] free drug program.

## 2017-05-28 NOTE — Telephone Encounter (Addendum)
Per e mail from Mina Marble, Building services engineer, [employer]'s case manager, Rondel Jumbo stated the patient has been enrolled in Tecfidera drug free program. [employer] has been unable to reach patient.  This RN called patient who stated she has spoken with [employer] and is going to call them back today with her insurance information.  This RN advised her to call back with any further needs. Patient verbalized understanding, appreciation.

## 2017-05-29 NOTE — Telephone Encounter (Signed)
Hospital doctor, spoke with [employer]. He stated that patient was enrolled yesterday for free drug. He stated that program sends her information and Rx to the specialty pharmacy. He stated they do have a Tecfidera Rx on file for patient. He stated it may take 48 hours for turn around with free drug. He stated if patient has not heard from [place] by early next week, have her call support coordinator to check on status.  Called patient and advised her of above conversation. Gave her number to Support coordinator and advised she call next week if she doesn't hear from [place] She verbalized understanding, appreciation.

## 2017-05-29 NOTE — Telephone Encounter (Signed)
Pt has called back asking to speak back with RN Pincus Sanes , please call

## 2017-05-29 NOTE — Telephone Encounter (Signed)
Spoke with patient who stated she received a call from [employer] yesterday stating a Tecfidera  prescription needed to be sent to the pharmacy Fax [phone]. This RN advised patient she received a fax this morning from [facility] stating patient is approved for free drug, [facility] is specialty pharmacy. Letter stated  If Rx is not on filer, a new one will need to be sent. This RN will check on status of Rx today. Patient verbalized understanding, appreciation.

## 2017-07-07 ENCOUNTER — Ambulatory Visit (INDEPENDENT_AMBULATORY_CARE_PROVIDER_SITE_OTHER): Payer: BLUE CROSS/BLUE SHIELD | Admitting: Neurology

## 2017-07-07 ENCOUNTER — Encounter: Payer: Self-pay | Admitting: Neurology

## 2017-07-07 VITALS — BP 125/77 | HR 67 | Ht 63.0 in | Wt 191.5 lb

## 2017-07-07 DIAGNOSIS — G35 Multiple sclerosis: Secondary | ICD-10-CM | POA: Diagnosis not present

## 2017-07-07 NOTE — Progress Notes (Signed)
[facility]  PATIENT: Susan Gomez DOB: [birth year]   REASON FOR VISIT: Follow-up for multiple sclerosis , gait abnormality HISTORY FROM: Patient    HISTORY OF PRESENT ILLNESS:[name] is a 61 year-old black female, referred by her primary care physician [name], to continue followup for her multiple sclerosis, and recent worsening of low back pain,    She is a prior patient of [name] since 2005. with diagnosis of relapsing remitting multiple sclerosis.  The inital symptoms was facial asymetry, later also developed transient right leg weakness. The diagnosis is confirmed by abnormal MRI brain, cervical, and lumbar puncture showed  more than 2 oligoclonal bands.   She has been on Betaseron every other day since 2005,. She denies any new onset symptoms or clear clinical flareups ever since, such as weakness, sensory changes, blurred vision or double vision, speech or swallowing problems, bowel or bladder difficulties, spasticity.   She complains of right side low back pain since 2000, right hip pain, shooting pain to right lateral thigh and leg,  failed to improve by physical therapy, and stretching exercise. she denies incontinence  MRI brain in 2012 showed subtle confluent periventricular and subcortical white matter hyperintensitioes which are comaptible with demyelinating disease but appear unchanged from MRI 09/11/06. No enhancing lesions, T 1 black holes or significant atrophy is seen.  MRI scan of the lumbar spine shows only minor disc signal and facet degenerative changes without significant compression.  She has lost followup because of the financial burden, She has been out of Betaseron since 2012, there was no clear flareup in between,  She came back because of worsening bilateral lower extremity pain, shooting pain to both legs, continued low back pain, she has gait difficulty because of the pain, she denies bilateral feet or hands  paresthesia, she denies bowel and bladder incontinence,  She reported a history of right eye bleeding of unsure etiology in July 4th 2013, was under the care of opathamologist [name], just received intra-epidural injection yesterday, with right conjunctiva small hematoma,  She also complains of 3 months history of left lip spasm, intermittent, intermitted right parietal area headaches,  She is a recovered addict of alcohol, cocaine, prescription medications.  UPDATE Jan 20th 2015:YY She complains of right jaw pain, shooting, sharp, intermittent, worse at night, bring tears to her eyes, hurts when she swallow, talking.  Her low back pain has much improved.  She was awake at night because of right jaw pain. She did not have MRI brain as planned. She has no gait difficulty, no incontinence.   UPDATE Feb 20th 2015:YY She presented to emergency room for right jaw pain, was getting prescription of Percocet in Nov 23 2013. Now her right jaw pain has much improved, she continued to have mild to moderate.lower back pain, she denies significant gait difficulty MRI of brain in January 2015, also compared with previous MRI in 2007, there is mild increase in supratentorium lesion load, but there was no enhancing lesions, JC virus ab was negative, with titer 0.22, January 20th 2015.  UPDATE June 3rd 2015:YY She started on Plegridy since March 2015, she had total of 4 injections, last injection was May 27th, before that was May 13rd, she was trained by [employer] Nurse at home with her first injection. She has heart palpitation, flu-like illness, lasting one day, during 1st, 2nd injection, aleve was helping, during 3rd, 4th injections, she has more reactions, large size skin hives, sore, itching, involving her back,  upper and lower extremity, dorsum hand. She has taken aleve, benadryl, Claritin, and tried different creams, Benadryl, hydrocortisone creams, none of it stop her from  itchings.  UPDATE Dec 7th 2015:YY Her low back pain has much improved, only has mild midline low back pain, she denies shooting pain from her back to her lower extremity, she denies gait difficulty, she denies bowel and bladder incontinence, she is not on any immunomodulation therapy at this point   UPDATE Dec 7th 2016:YY  she complains of feeling tired all the time, her husband went to hemodialysis, she denies significant gait difficulty, no visual loss,  I have reviewed her previous lumbar spine, mild degenerative changes no evidence of foraminal or canal stenosis  I personally reviewed and compared MRI brain w/wo in 2015 with 2007, overall only mild increase in supratentorium lesions, differentiation Diagnosis also including small vessel disease,  UPDATE Oct 14 2016:YY She has pain in her legs, sometimes difficulty walking. 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  UPDATE Jul 07 2017: She is tolerating Tecfidera since June 2018, doing well.  Her husband got kidney transplant, .  REVIEW OF SYSTEMS: Full 14 system review of systems performed and notable only for those listed, all others are neg:   Memory loss, speech difficulty, ringing ears, cough   ALLERGIES: Allergies  Allergen Reactions  . Codeine Other (See Comments)    Recovering addict - wishes not to consume if possible    HOME MEDICATIONS: Outpatient Medications Prior to Visit  Medication Sig Dispense Refill  . gabapentin (NEURONTIN) 300 MG capsule TAKE 2 CAPSULES THREE TIMES A DAY 540 capsule 1  . losartan-hydrochlorothiazide (HYZAAR) 100-12.5 MG per tablet Take 1 tablet by mouth daily.     Marland Kitchen SYNTHROID 112 MCG tablet Take 112 mcg by mouth every other day.      No facility-administered medications prior to visit.     PAST MEDICAL HISTORY: Past Medical History:  Diagnosis Date  . Encounter for long-term (current) use of other medications   . High blood  pressure   . MS (multiple sclerosis) (HCC)   . Thyroid disease     PAST SURGICAL HISTORY: Past Surgical History:  Procedure Laterality Date  . KNEE SURGERY    . LAPAROSCOPIC CHOLECYSTECTOMY SINGLE SITE WITH INTRAOPERATIVE CHOLANGIOGRAM N/A 12/02/2014   Procedure: LAPAROSCOPIC CHOLECYSTECTOMY SINGLE SITE WITH INTRAOPERATIVE CHOLANGIOGRAM;  Surgeon: Karie Soda, MD;  Location: [facility];  Service: General;  Laterality: N/A;  . left thumb    . VAGINAL HYSTERECTOMY      FAMILY HISTORY: Family History  Problem Relation Age of Onset  . Family history unknown: Yes    SOCIAL HISTORY: Social History   Social History  . Marital status: Married    Spouse name: [name]  . Number of children: 3  . Years of education: 15+   Occupational History  .      Disabled   Social History Main Topics  . Smoking status: Never Smoker  . Smokeless tobacco: Never Used  . Alcohol use No  . Drug use: No  . Sexual activity: Not on file   Other Topics Concern  . Not on file   Social History Narrative   Patient lives at home with her husband [name]).   Patient has 3 children and husbands has 4 children.   Patient has college education.   Patient is left-handed.   Patient drinks one cup of coffee in the am  and two cups at night.           PHYSICAL EXAM  Vitals:   07/07/17 1348  BP: 125/77  Pulse: 67  Weight: 191 lb 8 oz (86.9 kg)  Height: 5\' 3"  (1.6 m)   Body mass index is 33.92 kg/m.  Generalized: Well developed, Obese female in no acute distress  Head: normocephalic and atraumatic,. Oropharynx benign  Neck: Supple,  Cardiac: Regular rate rhythm, no murmur  Musculoskeletal: No deformity   Neurological examination   Mentation: Alert oriented to time, place, history taking. Attention span and concentration appropriate. Recent and remote memory intact.  Follows all commands speech and language fluent.   Cranial nerve II-XII: Fundoscopic exam reveals sharp disc margins on the left  unable to visualize on the right.Pupils were equal round reactive to light extraocular movements were full, visual field were full on confrontational test. Facial sensation and strength were normal. hearing was intact to finger rubbing bilaterally. Uvula tongue midline. head turning and shoulder shrug were normal and symmetric.Tongue protrusion into cheek strength was normal. Motor: normal bulk and tone in the upper extremities, give way weakness at bilateral hip flexion and knee flexion, no spasticity .  Sensory: normal and symmetric to light touch, pinprick, and  Vibration, proprioception  Coordination: finger-nose-finger, heel-to-shin bilaterally, no dysmetria Reflexes: Brachioradialis 2/2, biceps 2/2, triceps 2/2, patellar 2/2, Achilles 2/2, plantar responses were flexor bilaterally. Gait and Station: Rising up from seated position without assistance, slow steady gait able to perform tiptoe and heel walking mild difficulty with tandem no assistive device   DIAGNOSTIC DATA (LABS, IMAGING, TESTING) - I reviewed patient records, labs, notes, testing and imaging myself where available.  Lab Results  Component Value Date   WBC 4.9 04/16/2017   HGB 12.1 04/16/2017   HCT 35.7 04/16/2017   MCV 89 04/16/2017   PLT 255 04/16/2017      Component Value Date/Time   NA 142 04/16/2017 0857   K 3.7 04/16/2017 0857   CL 103 04/16/2017 0857   CO2 24 04/16/2017 0857   GLUCOSE 96 04/16/2017 0857   GLUCOSE 99 12/04/2014 0505   BUN 12 04/16/2017 0857   CREATININE 0.92 04/16/2017 0857   CALCIUM 9.8 04/16/2017 0857   PROT 7.1 04/16/2017 0857   ALBUMIN 4.3 04/16/2017 0857   AST 17 04/16/2017 0857   ALT 8 04/16/2017 0857   ALKPHOS 38 (L) 04/16/2017 0857   BILITOT 0.6 04/16/2017 0857   GFRNONAA 69 04/16/2017 0857   GFRAA 79 04/16/2017 0857    ASSESSMENT AND PLAN 61 years old right-handed African American female Relapsing remitting multiple sclerosis  Continue Tecfidera  Lab evaluation        Levert Feinstein, M.D. Ph.D.  [facility] [address] [place], [place] [address] Phone: [phone] Fax:      [phone]

## 2017-07-08 LAB — CBC WITH DIFFERENTIAL
Basophils Absolute: 0 10*3/uL (ref 0.0–0.2)
Basos: 1 %
EOS (ABSOLUTE): 0.1 10*3/uL (ref 0.0–0.4)
Eos: 1 %
Hematocrit: 36.7 % (ref 34.0–46.6)
Hemoglobin: 12.1 g/dL (ref 11.1–15.9)
IMMATURE GRANS (ABS): 0 10*3/uL (ref 0.0–0.1)
Immature Granulocytes: 0 %
LYMPHS: 65 %
Lymphocytes Absolute: 3.5 10*3/uL — ABNORMAL HIGH (ref 0.7–3.1)
MCH: 29.5 pg (ref 26.6–33.0)
MCHC: 33 g/dL (ref 31.5–35.7)
MCV: 90 fL (ref 79–97)
Monocytes Absolute: 0.4 10*3/uL (ref 0.1–0.9)
Monocytes: 7 %
Neutrophils Absolute: 1.4 10*3/uL (ref 1.4–7.0)
Neutrophils: 26 %
RBC: 4.1 x10E6/uL (ref 3.77–5.28)
RDW: 14.6 % (ref 12.3–15.4)
WBC: 5.4 10*3/uL (ref 3.4–10.8)

## 2017-07-08 LAB — COMPREHENSIVE METABOLIC PANEL
ALK PHOS: 38 IU/L — AB (ref 39–117)
ALT: 10 IU/L (ref 0–32)
AST: 21 IU/L (ref 0–40)
Albumin/Globulin Ratio: 1.7 (ref 1.2–2.2)
Albumin: 4.7 g/dL (ref 3.5–5.5)
BUN/Creatinine Ratio: 11 (ref 9–23)
BUN: 11 mg/dL (ref 6–24)
Bilirubin Total: 0.7 mg/dL (ref 0.0–1.2)
CO2: 26 mmol/L (ref 20–29)
CREATININE: 1 mg/dL (ref 0.57–1.00)
Calcium: 9.9 mg/dL (ref 8.7–10.2)
Chloride: 100 mmol/L (ref 96–106)
GFR calc Af Amer: 72 mL/min/{1.73_m2} (ref >59)
GFR calc non Af Amer: 62 mL/min/{1.73_m2} (ref >59)
Globulin, Total: 2.8 g/dL (ref 1.5–4.5)
Glucose: 106 mg/dL — ABNORMAL HIGH (ref 65–99)
POTASSIUM: 3.2 mmol/L — AB (ref 3.5–5.2)
Sodium: 145 mmol/L — ABNORMAL HIGH (ref 134–144)
Total Protein: 7.5 g/dL (ref 6.0–8.5)

## 2017-07-08 LAB — TSH: TSH: 1.99 u[IU]/mL (ref 0.450–4.500)

## 2017-07-20 ENCOUNTER — Ambulatory Visit: Payer: BLUE CROSS/BLUE SHIELD | Admitting: Nurse Practitioner

## 2017-11-17 ENCOUNTER — Telehealth: Payer: Self-pay | Admitting: *Deleted

## 2017-11-17 NOTE — Telephone Encounter (Signed)
Received note from Liz Beach- field nurse educator/Alicia Hopkins, specialty pharmacy account executive with indication that multiple attempts have been made by nurse re: Tecfidera.  Requested patient call back to advise if she is still taking Tecfidera, having any side effects, problems.

## 2017-11-17 NOTE — Telephone Encounter (Signed)
Pt has returned the call to RN, she is asking for a call back

## 2017-11-17 NOTE — Telephone Encounter (Signed)
Called patient who stated she is taking Tecfidera as prescribed. She denied any side effects or problems with medication. She stated that she often doesn't answer her phone if she doesn't recognize the number.  She stated she has a number on the medication box and will call that number to see if that is where this information came from. this RN reminded her of her FU in March, advised she call sooner for any problems, questions. She verbalized understanding, appreciation.

## 2018-01-01 NOTE — Progress Notes (Signed)
[facility]  PATIENT: Susan Gomez DOB: Mar 26, 1956   REASON FOR VISIT: Follow-up for relapsing remitting multiple sclerosis HISTORY FROM: patient    HISTORY OF PRESENT ILLNESS:  Susan Gomez is a 62 year-old black female, referred by her primary care physician [name], to continue followup for her multiple sclerosis, and recent worsening of low back pain,   She is a prior patient of [name] since 2005. with diagnosis of relapsing remitting multiple sclerosis. The inital symptoms was facial asymetry, later also developed transient right leg weakness. The diagnosis is confirmed by abnormal MRI brain, cervical, and lumbar puncture showed more than 2 oligoclonal bands.   She has been on Betaseron every other day since 2005,. She denies any new onset symptoms or clear clinical flareups ever since, such as weakness, sensory changes, blurred vision or double vision, speech or swallowing problems, bowel or bladder difficulties, spasticity.   She complains of right side low back pain since 2000, right hip pain, shooting pain to right lateral thigh and leg, failed to improve by physical therapy, and stretching exercise. she denies incontinence  MRI brain in 2012 showed subtle confluent periventricular and subcortical white matter hyperintensitioes which are comaptible with demyelinating disease but appear unchanged from MRI 09/11/06. No enhancing lesions, T 1 black holes or significant atrophy is seen.  MRI scan of the lumbar spine shows only minor disc signal and facet degenerative changes without significant compression.  She has lost followup because of the financial burden, She has been out of Betaseron since 2012, there was no clear flareup in between,  She came back because of worsening bilateral lower extremity pain, shooting pain to both legs, continued low back pain, she has gait difficulty because of the pain, she denies bilateral feet or  hands paresthesia, she denies bowel and bladder incontinence,  She reported a history of right eye bleeding of unsure etiology in July 4th 2013, was under the care of opathamologist [name], just received intra-epidural injection yesterday, with right conjunctiva small hematoma,  She also complains of 3 months history of left lip spasm, intermittent, intermitted right parietal area headaches,  She is a recovered addict of alcohol, cocaine, prescription medications.  UPDATE Jan 20th 2015:YY She complains of right jaw pain, shooting, sharp, intermittent, worse at night, bring tears to her eyes, hurts when she swallow, talking. Her low back pain has much improved. She was awake at night because of right jaw pain. She did not have MRI brain as planned. She has no gait difficulty, no incontinence.   UPDATE Feb 20th 2015:YY She presented to emergency room for right jaw pain, was getting prescription of Percocet in Nov 23 2013. Now her right jaw pain has much improved, she continued to have mild to moderate.lower back pain, she denies significant gait difficulty MRI of brain in January 2015, also compared with previous MRI in 2007, there is mild increase in supratentorium lesion load, but there was no enhancing lesions, JC virus ab was negative, with titer 0.22, January 20th 2015.  UPDATE June 3rd 2015:YY She started on Plegridy since March 2015,she had total of 4 injections, last injection was May 27th, before that was May 13rd, she was trained by [employer] Nurse at home with her first injection. She has heart palpitation, flu-like illness, lasting one day, during 1st, 2nd injection, aleve was helping, during 3rd, 4th injections, she has more reactions, large size skin hives, sore, itching, involving her back, upper and lower extremity, dorsum hand.  She has taken aleve, benadryl, Claritin, and tried different creams, Benadryl, hydrocortisone creams, none of it stop her from  itchings.  UPDATE Dec 7th 2015:YY Her low back pain has much improved, only has mild midline low back pain, she denies shooting pain from her back to her lower extremity, she denies gait difficulty, she denies bowel and bladder incontinence, she is not on any immunomodulation therapy at this point   UPDATE Dec 7th 2016:YY she complains of feeling tired all the time, her husband went to hemodialysis, she denies significant gait difficulty, no visual loss,  I have reviewed her previous lumbar spine, mild degenerative changes no evidence of foraminal or canal stenosis  I personally reviewed and compared MRI brain w/wo in 2015 with 2007, overall only mild increase in supratentorium lesions, differentiation Diagnosis also including small vessel disease,  UPDATE Oct 14 2016:YY She has pain in her legs, sometimes difficulty walking. 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  UPDATE Jul 07 2017: She is tolerating Tecfidera since June 2018, doing well.  Her husband got kidney transplant, .  UPDATE January 04, 2018 (JV): Patient is seen today for a six-month follow-up.  Overall she is doing well without any new complaints.  Patient continues to take Tecfidera and Neurontin without side effects.  No new complaints at today's follow-up visit.   REVIEW OF SYSTEMS: Full 14 system review of systems performed and notable only for those listed, all others are neg:  Constitutional: neg  Cardiovascular: Murmur Ear/Nose/Throat: Ringing in ears Skin: neg Eyes: neg Respiratory: neg Gastroitestinal: neg  Hematology/Lymphatic: neg  Endocrine: neg Musculoskeletal:neg Allergy/Immunology: neg Neurological: Headache, weakness sometimes, tremors sometime Psychiatric: neg Sleep : neg   ALLERGIES: Allergies  Allergen Reactions  . Codeine Other (See Comments)    Recovering addict - wishes not to consume if possible    HOME  MEDICATIONS: Outpatient Medications Prior to Visit  Medication Sig Dispense Refill  . Dimethyl Fumarate (TECFIDERA) 240 MG CPDR Take 240 mg by mouth 2 (two) times daily.    Marland Kitchen gabapentin (NEURONTIN) 300 MG capsule TAKE 2 CAPSULES THREE TIMES A DAY 540 capsule 1  . losartan-hydrochlorothiazide (HYZAAR) 100-12.5 MG per tablet Take 1 tablet by mouth daily.     Marland Kitchen SYNTHROID 112 MCG tablet Take 112 mcg by mouth every other day.      No facility-administered medications prior to visit.     PAST MEDICAL HISTORY: Past Medical History:  Diagnosis Date  . Encounter for long-term (current) use of other medications   . High blood pressure   . MS (multiple sclerosis) (HCC)   . Thyroid disease     PAST SURGICAL HISTORY: Past Surgical History:  Procedure Laterality Date  . KNEE SURGERY    . LAPAROSCOPIC CHOLECYSTECTOMY SINGLE SITE WITH INTRAOPERATIVE CHOLANGIOGRAM N/A 12/02/2014   Procedure: LAPAROSCOPIC CHOLECYSTECTOMY SINGLE SITE WITH INTRAOPERATIVE CHOLANGIOGRAM;  Surgeon: Karie Soda, MD;  Location: [facility];  Service: General;  Laterality: N/A;  . left thumb    . VAGINAL HYSTERECTOMY      FAMILY HISTORY: Family History  Family history unknown: Yes    SOCIAL HISTORY: Social History   Socioeconomic History  . Marital status: Married    Spouse name: [name]  . Number of children: 3  . Years of education: 15+  . Highest education level: Not on file  Social Needs  . Financial resource strain: Not on file  . Food insecurity - worry: Not on  file  . Food insecurity - inability: Not on file  . Transportation needs - medical: Not on file  . Transportation needs - non-medical: Not on file  Occupational History    Comment: Disabled  Tobacco Use  . Smoking status: Never Smoker  . Smokeless tobacco: Never Used  Substance and Sexual Activity  . Alcohol use: No    Alcohol/week: 0.0 oz  . Drug use: No  . Sexual activity: Not on file  Other Topics Concern  . Not on file  Social History  Narrative   Patient lives at home with her husband [name]).   Patient has 3 children and husbands has 4 children.   Patient has college education.   Patient is left-handed.   Patient drinks one cup of coffee in the am and two cups at night.        PHYSICAL EXAM  There were no vitals filed for this visit. There is no height or weight on file to calculate BMI.  Generalized: Well developed, middle-aged African-American female, in no acute distress  Head: normocephalic and atraumatic,. Oropharynx benign  Neck: Supple, no carotid bruits  Cardiac: Regular rate rhythm, no murmur  Musculoskeletal: No deformity   Neurological examination   Mentation: Alert oriented to time, place, history taking. Attention span and concentration appropriate. Recent and remote memory intact.  Follows all commands speech and language fluent.   Cranial nerve II-XII: Fundoscopic exam reveals sharp disc margins.Pupils were equal round reactive to light extraocular movements were full, visual field were full on confrontational test. Facial sensation and strength were normal. hearing was intact to finger rubbing bilaterally. Uvula tongue midline. head turning and shoulder shrug were normal and symmetric.Tongue protrusion into cheek strength was normal. Motor: normal bulk and tone, full strength in the BUE.  Decreased strength bilaterally in hip flexors., fine finger movements normal, no pronator drift. No focal weakness Sensory: normal and symmetric to light touch, pinprick, and  Vibration, proprioception  Coordination: finger-nose-finger, heel-to-shin bilaterally, no dysmetria Reflexes: Brachioradialis 2/2, biceps 2/2, triceps 2/2, patellar 2/2, Achilles 2/2, plantar responses were flexor bilaterally. Gait and Station: Rising up from seated position without assistance, normal stance,  moderate stride, good arm swing, smooth turning, able to perform tiptoe, and heel walking without difficulty. Tandem gait is  steady  DIAGNOSTIC DATA (LABS, IMAGING, TESTING) - I reviewed patient records, labs, notes, testing and imaging myself where available.  Lab Results  Component Value Date   WBC 5.4 07/07/2017   HGB 12.1 07/07/2017   HCT 36.7 07/07/2017   MCV 90 07/07/2017   PLT 255 04/16/2017      Component Value Date/Time   NA 145 (H) 07/07/2017 1423   K 3.2 (L) 07/07/2017 1423   CL 100 07/07/2017 1423   CO2 26 07/07/2017 1423   GLUCOSE 106 (H) 07/07/2017 1423   GLUCOSE 99 12/04/2014 0505   BUN 11 07/07/2017 1423   CREATININE 1.00 07/07/2017 1423   CALCIUM 9.9 07/07/2017 1423   PROT 7.5 07/07/2017 1423   ALBUMIN 4.7 07/07/2017 1423   AST 21 07/07/2017 1423   ALT 10 07/07/2017 1423   ALKPHOS 38 (L) 07/07/2017 1423   BILITOT 0.7 07/07/2017 1423   GFRNONAA 62 07/07/2017 1423   GFRAA 72 07/07/2017 1423   No results found for: CHOL, HDL, LDLCALC, LDLDIRECT, TRIG, CHOLHDL No results found for: ZOXW9U Lab Results  Component Value Date   VITAMINB12 945 11/22/2013   Lab Results  Component Value Date   TSH 1.990 07/07/2017  ASSESSMENT AND PLAN 62 years old right-handed African American female Relapsing remitting multiple sclerosis             Continue Tecfidera             Continue Neurontin  CBC and CMP obtained today Patient will follow-up in 6 months with Susan Jones, NP   George Hugh, AGNP-BC [facility] [address] [place], [place] [address] [phone]

## 2018-01-04 ENCOUNTER — Telehealth: Payer: Self-pay | Admitting: *Deleted

## 2018-01-04 ENCOUNTER — Encounter: Payer: Self-pay | Admitting: Adult Health

## 2018-01-04 ENCOUNTER — Ambulatory Visit (INDEPENDENT_AMBULATORY_CARE_PROVIDER_SITE_OTHER): Payer: Medicare Other | Admitting: Adult Health

## 2018-01-04 DIAGNOSIS — G35 Multiple sclerosis: Secondary | ICD-10-CM | POA: Diagnosis not present

## 2018-01-04 DIAGNOSIS — Z5181 Encounter for therapeutic drug level monitoring: Secondary | ICD-10-CM | POA: Diagnosis not present

## 2018-01-04 NOTE — Patient Instructions (Signed)
Your Plan:  Continue tecfidera and gabapentin  Tecfidera - [phone]  Follow up in 6 months with [place], NP or call earlier if needed    Thank you for coming to see [place] at [facility]. I hope we have been able to provide you high quality care today.  You may receive a patient satisfaction survey over the next few weeks. We would appreciate your feedback and comments so that we may continue to improve ourselves and the health of our patients.

## 2018-01-04 NOTE — Telephone Encounter (Signed)
LVM informing patient that Enid Skeens NP had to leave for an emergency. This Programmer, multimedia. Advised her follow up today needs to be rescheduled.  Left office number.

## 2018-01-05 LAB — COMPREHENSIVE METABOLIC PANEL
ALT: 11 IU/L (ref 0–32)
AST: 15 IU/L (ref 0–40)
Albumin/Globulin Ratio: 1.4 (ref 1.2–2.2)
Albumin: 4.4 g/dL (ref 3.5–5.5)
Alkaline Phosphatase: 43 IU/L (ref 39–117)
BILIRUBIN TOTAL: 0.8 mg/dL (ref 0.0–1.2)
BUN/Creatinine Ratio: 11 (ref 9–23)
BUN: 11 mg/dL (ref 6–24)
CALCIUM: 9.6 mg/dL (ref 8.7–10.2)
CHLORIDE: 105 mmol/L (ref 96–106)
CO2: 23 mmol/L (ref 20–29)
Creatinine, Ser: 1.03 mg/dL — ABNORMAL HIGH (ref 0.57–1.00)
GFR calc Af Amer: 69 mL/min/{1.73_m2} (ref >59)
GFR, EST NON AFRICAN AMERICAN: 60 mL/min/{1.73_m2} (ref >59)
Globulin, Total: 3.1 g/dL (ref 1.5–4.5)
Glucose: 96 mg/dL (ref 65–99)
Potassium: 3.7 mmol/L (ref 3.5–5.2)
Sodium: 147 mmol/L — ABNORMAL HIGH (ref 134–144)
Total Protein: 7.5 g/dL (ref 6.0–8.5)

## 2018-01-05 LAB — CBC WITH DIFFERENTIAL/PLATELET
Basophils Absolute: 0.1 10*3/uL (ref 0.0–0.2)
Basos: 1 %
EOS (ABSOLUTE): 0.1 10*3/uL (ref 0.0–0.4)
Eos: 1 %
Hematocrit: 37.5 % (ref 34.0–46.6)
Hemoglobin: 12.4 g/dL (ref 11.1–15.9)
IMMATURE GRANS (ABS): 0 10*3/uL (ref 0.0–0.1)
Immature Granulocytes: 0 %
Lymphocytes Absolute: 2.5 10*3/uL (ref 0.7–3.1)
Lymphs: 34 %
MCH: 30.5 pg (ref 26.6–33.0)
MCHC: 33.1 g/dL (ref 31.5–35.7)
MCV: 92 fL (ref 79–97)
Monocytes Absolute: 0.5 10*3/uL (ref 0.1–0.9)
Monocytes: 7 %
NEUTROS ABS: 4.3 10*3/uL (ref 1.4–7.0)
Neutrophils: 57 %
Platelets: 307 10*3/uL (ref 150–379)
RBC: 4.07 x10E6/uL (ref 3.77–5.28)
RDW: 14.3 % (ref 12.3–15.4)
WBC: 7.5 10*3/uL (ref 3.4–10.8)

## 2018-01-21 ENCOUNTER — Telehealth: Payer: Self-pay | Admitting: Adult Health

## 2018-01-21 NOTE — Telephone Encounter (Signed)
Pt request Dimethyl Fumarate (TECFIDERA) 240 MG CPDR sent to [facility].  Pt has 1 day left. She just got approved for financial aid today. Please call when rx has been sent

## 2018-01-21 NOTE — Telephone Encounter (Signed)
Received e mail from Mina Marble stating that [employer] has financial documents but can take 24-48 hrs to process them. Mina Marble stated Angie, case manager will reach out to [facility] tomorrow to see if it can be expedited.  This RN spoke to patient and explained suturation. Advised her to answer her phone tomorrow in the event [employer] is trying to reach her to schedule a delivery of Tecfidera.  Patient verbalized understanding, appreciation for call.

## 2018-01-21 NOTE — Telephone Encounter (Signed)
Called [facility], spoke with Boneta Lucks who stated once pt is approved it can take 24-48 hrs for approval. This time frame does not include weekends. The patient has already gotten a courtesy refill in the past; she cannot get another.   This RN thanked her.  Called Mina Marble, [employer] planner , lvm requesting call back to assist with this matter.  Received call back form [place]. She will reach out to Case Manager and call back.

## 2018-01-25 NOTE — Telephone Encounter (Signed)
Pt returned RN's call. She has been in contact with [facility], she rec'd tecfidera the next so she has not been without the medication. FYI

## 2018-01-25 NOTE — Telephone Encounter (Signed)
LVM requesting patient call back to inform this RN if she heard from [facility], either has Tecfidera or a delivery is scheduled. Advised she may give information to phone staff if she wishes.

## 2018-02-03 DIAGNOSIS — R1013 Epigastric pain: Secondary | ICD-10-CM | POA: Diagnosis not present

## 2018-02-09 ENCOUNTER — Telehealth: Payer: Self-pay | Admitting: Adult Health

## 2018-02-09 NOTE — Telephone Encounter (Signed)
Discuss with PCP not [place]

## 2018-02-09 NOTE — Telephone Encounter (Signed)
Rn call patient that Susan Jones NP recommend she call her PCP about the fasting recommendations. PT verbalized understanding.

## 2018-02-09 NOTE — Telephone Encounter (Signed)
Pt called her church is going to start fasting for 7 days, she is wanting to know if she could participate. She would only be allowed to drink juice for the 7 days. Please call to advise

## 2018-03-16 DIAGNOSIS — H353131 Nonexudative age-related macular degeneration, bilateral, early dry stage: Secondary | ICD-10-CM | POA: Diagnosis not present

## 2018-03-16 DIAGNOSIS — H353212 Exudative age-related macular degeneration, right eye, with inactive choroidal neovascularization: Secondary | ICD-10-CM | POA: Diagnosis not present

## 2018-03-16 DIAGNOSIS — H35059 Retinal neovascularization, unspecified, unspecified eye: Secondary | ICD-10-CM | POA: Diagnosis not present

## 2018-03-16 DIAGNOSIS — H2512 Age-related nuclear cataract, left eye: Secondary | ICD-10-CM | POA: Diagnosis not present

## 2018-03-16 DIAGNOSIS — H2511 Age-related nuclear cataract, right eye: Secondary | ICD-10-CM | POA: Diagnosis not present

## 2018-04-08 ENCOUNTER — Other Ambulatory Visit: Payer: Self-pay | Admitting: *Deleted

## 2018-04-08 MED ORDER — DIMETHYL FUMARATE 240 MG PO CPDR: 240.0000 mg | DELAYED_RELEASE_CAPSULE | Freq: Two times a day (BID) | ORAL | 1 refills | Status: DC

## 2018-04-08 NOTE — Telephone Encounter (Signed)
Tecfidera prescription refill 90day supply.  Use [facility] in [place].  Done for 6 months.  Has f/u in 07/2018.

## 2018-05-03 ENCOUNTER — Telehealth: Payer: Self-pay | Admitting: Adult Health

## 2018-05-03 ENCOUNTER — Encounter: Payer: Self-pay | Admitting: *Deleted

## 2018-05-03 NOTE — Telephone Encounter (Signed)
Letter composed, signed by Dr Terrace Arabia. Letter at front desk for patient to pick up tomorrow.

## 2018-05-03 NOTE — Telephone Encounter (Signed)
Pt called requesting a letter filled out informing the judical department that she is unable to drive long distances. Stating she is needing this letter so that her son can be assigned to a closer facility in [place]. Please call to advise

## 2018-05-03 NOTE — Telephone Encounter (Signed)
Spoke with patient for clarification on letter. Her son is now in a facility near [place], and due to her MS she cannot drive that distance. She stated she is not to stand or sit for long periods. She stated she is trying to get him into a facility mere [place] because she can drive that distance.  She stated her son had spoken to her sister about the letter. She will call her sister to get further information. She requested this RN call her back today after lunch. This RN advised her she will call back. Patient verbalized understanding, appreciation.

## 2018-05-03 NOTE — Telephone Encounter (Signed)
Patient called back and stated that she had no further information for the letter.  She would like to pick it up tomorrow after 2 pm. This RN advised she will only get a call back if the letter cannot be done for her. She verbalized understanding, appreciation.

## 2018-05-03 NOTE — Telephone Encounter (Signed)
Called patient, LVM requesting call back. 

## 2018-06-16 ENCOUNTER — Other Ambulatory Visit: Payer: Self-pay

## 2018-06-16 NOTE — Patient Outreach (Signed)
[facility]) Care Management  06/16/2018  Susan Gomez [birth year] [id number]   Telephone call to review health risk assessment and screen for care management needs for  [employer].  Member states that she has high blood pressure but it is good with her medications.  States she does have MS but she is not having any difficulty with it at this time.  States she sees her doctors regularly.  States she is to see her primary care doctor 9/3 for a physical.  States she has the forms to complete her Advanced Directives and she plans to complete them.    Telephone screening call completed to review members health risk assessment.   Member declines health coach for HTN as it is well controlled. Member assessed with no further intervention needed. Plan to send successful outreach letter with program brochure and 24 hr nurse line magnet.  Susan Major RN, Susan Gomez, CDE Care Management Coordinator [facility] Care Management [phone]

## 2018-07-06 NOTE — Progress Notes (Signed)
[facility]  PATIENT: Susan Gomez DOB: 09-12-1956   REASON FOR VISIT: Follow-up for relapsing remitting multiple sclerosis HISTORY FROM: Patient    HISTORY OF PRESENT ILLNESS: Susan Gomez is a 62 year-old black female, referred by her primary care physician Susan Gomez, to continue followup for her multiple sclerosis, and recent worsening of low back pain,   She is a prior patient of Susan Gomez since 2005. with diagnosis of relapsing remitting multiple sclerosis. The inital symptoms was facial asymetry, later also developed transient right leg weakness. The diagnosis is confirmed by abnormal MRI brain, cervical, and lumbar puncture showed more than 2 oligoclonal bands.   She has been on Betaseron every other day since 2005,. She denies any new onset symptoms or clear clinical flareups ever since, such as weakness, sensory changes, blurred vision or double vision, speech or swallowing problems, bowel or bladder difficulties, spasticity.   She complains of right side low back pain since 2000, right hip pain, shooting pain to right lateral thigh and leg, failed to improve by physical therapy, and stretching exercise. she denies incontinence  MRI brain in 2012 showed subtle confluent periventricular and subcortical white matter hyperintensitioes which are comaptible with demyelinating disease but appear unchanged from MRI 09/11/06. No enhancing lesions, T 1 black holes or significant atrophy is seen.  MRI scan of the lumbar spine shows only minor disc signal and facet degenerative changes without significant compression.  She has lost followup because of the financial burden, She has been out of Betaseron since 2012, there was no clear flareup in between,  She came back because of worsening bilateral lower extremity pain, shooting pain to both legs, continued low back pain, she has gait difficulty because of the pain, she denies bilateral feet or  hands paresthesia, she denies bowel and bladder incontinence,  She reported a history of right eye bleeding of unsure etiology in July 4th 2013, was under the care of opathamologist Susan Gomez, just received intra-epidural injection yesterday, with right conjunctiva small hematoma,  She also complains of 3 months history of left lip spasm, intermittent, intermitted right parietal area headaches,  She is a recovered addict of alcohol, cocaine, prescription medications.  UPDATE Jan 20th 2015:YY She complains of right jaw pain, shooting, sharp, intermittent, worse at night, bring tears to her eyes, hurts when she swallow, talking. Her low back pain has much improved. She was awake at night because of right jaw pain. She did not have MRI brain as planned. She has no gait difficulty, no incontinence.   UPDATE Feb 20th 2015:YY She presented to emergency room for right jaw pain, was getting prescription of Percocet in Nov 23 2013. Now her right jaw pain has much improved, she continued to have mild to moderate.lower back pain, she denies significant gait difficulty MRI of brain in January 2015, also compared with previous MRI in 2007, there is mild increase in supratentorium lesion load, but there was no enhancing lesions, JC virus ab was negative, with titer 0.22, January 20th 2015.  UPDATE June 3rd 2015:YY She started on Plegridy since March 2015,she had total of 4 injections, last injection was May 27th, before that was May 13rd, she was trained by Susan Gomez at home with her first injection. She has heart palpitation, flu-like illness, lasting one day, during 1st, 2nd injection, aleve was helping, during 3rd, 4th injections, she has more reactions, large size skin hives, sore, itching, involving her back, upper and lower extremity, dorsum hand. She  has taken aleve, benadryl, Claritin, and tried different creams, Benadryl, hydrocortisone creams, none of it stop her from  itchings.  UPDATE Dec 7th 2015:YY Her low back pain has much improved, only has mild midline low back pain, she denies shooting pain from her back to her lower extremity, she denies gait difficulty, she denies bowel and bladder incontinence, she is not on any immunomodulation therapy at this point   UPDATE Dec 7th 2016:YY she complains of feeling tired all the time, her husband went to hemodialysis, she denies significant gait difficulty, no visual loss,  I have reviewed her previous lumbar spine, mild degenerative changes no evidence of foraminal or canal stenosis  I personally reviewed and compared MRI brain w/wo in 2015 with 2007, overall only mild increase in supratentorium lesions, differentiation Diagnosis also including small vessel disease,  UPDATE Oct 14 2016:YY She has pain in her legs, sometimes difficulty walking. 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  UPDATE [id number]: She is tolerating Tecfidera since June 2018, doing well.  Her husband got kidney transplant, .  UPDATE January 04, 2018 (JV): Patient is seen today for a six-month follow-up.  Overall she is doing well without any new complaints.  Patient continues to take Tecfidera and Neurontin without side effects.  No new complaints at today's follow-up visit. UPDATE 9/4/2019CM Susan Gomez, 62 year old female returns for follow-up with a history of multiple sclerosis.  Relapsing remitting.  She is currently on tecfidera without side effects.  She also takes Neurontin.  She denies any increased weakness sensory changes visual disturbance speech or swallowing problems.  She denies any bowel or bladder difficulty.  Last MRI of the brain 12/30/2017Abnormal MRI brain (with and without) demonstrating: 1. Mild periventricular and subcortical and juxtacortical foci of hazy T2 hyperintensities, consistent with chronic demyelinating disease. 2. No abnormal lesions on  post-contrast views.  3. No significant change from MRI on 12/02/13.  She returns for reevaluation REVIEW OF SYSTEMS: Full 14 system review of systems performed and notable only for those listed, all others are neg:  Constitutional: neg  Cardiovascular: neg Ear/Nose/Throat: neg  Skin: neg Eyes: neg Respiratory: neg Gastroitestinal: neg  Hematology/Lymphatic: neg  Endocrine: neg Musculoskeletal: Joint pain Allergy/Immunology: neg Neurological: neg Psychiatric: neg Sleep : neg   ALLERGIES: Allergies  Allergen Reactions  . Codeine Other (See Comments)    Recovering addict - wishes not to consume if possible    HOME MEDICATIONS: Outpatient Medications Prior to Visit  Medication Sig Dispense Refill  . Dimethyl Fumarate (TECFIDERA) 240 MG CPDR Take 1 capsule (240 mg total) by mouth 2 (two) times daily. 180 capsule 1  . gabapentin (NEURONTIN) 300 MG capsule TAKE 2 CAPSULES THREE TIMES A DAY 540 capsule 1  . losartan-hydrochlorothiazide (HYZAAR) 100-12.5 MG per tablet Take 1 tablet by mouth daily.     Marland Kitchen SYNTHROID 112 MCG tablet Take 112 mcg by mouth every other day.      No facility-administered medications prior to visit.     PAST MEDICAL HISTORY: Past Medical History:  Diagnosis Date  . Encounter for long-term (current) use of other medications   . High blood pressure   . MS (multiple sclerosis) (HCC)   . Thyroid disease     PAST SURGICAL HISTORY: Past Surgical History:  Procedure Laterality Date  . KNEE SURGERY    . LAPAROSCOPIC CHOLECYSTECTOMY SINGLE SITE WITH INTRAOPERATIVE CHOLANGIOGRAM N/A 12/02/2014   Procedure: LAPAROSCOPIC CHOLECYSTECTOMY SINGLE SITE WITH INTRAOPERATIVE  CHOLANGIOGRAM;  Surgeon: Karie Soda, MD;  Location: [facility];  Service: General;  Laterality: N/A;  . left thumb    . VAGINAL HYSTERECTOMY      FAMILY HISTORY: Family History  Family history unknown: Yes    SOCIAL HISTORY: Social History   Socioeconomic History  . Marital status:  Married    Spouse name: [name]  . Number of children: 3  . Years of education: 15+  . Highest education level: Not on file  Occupational History    Comment: Disabled  Social Needs  . Financial resource strain: Not on file  . Food insecurity:    Worry: Not on file    Inability: Not on file  . Transportation needs:    Medical: Not on file    Non-medical: Not on file  Tobacco Use  . Smoking status: Never Smoker  . Smokeless tobacco: Never Used  Substance and Sexual Activity  . Alcohol use: No    Alcohol/week: 0.0 standard drinks  . Drug use: No  . Sexual activity: Not on file  Lifestyle  . Physical activity:    Days per week: Not on file    Minutes per session: Not on file  . Stress: Not on file  Relationships  . Social connections:    Talks on phone: Not on file    Gets together: Not on file    Attends religious service: Not on file    Active member of club or organization: Not on file    Attends meetings of clubs or organizations: Not on file    Relationship status: Not on file  . Intimate partner violence:    Fear of current or ex partner: Not on file    Emotionally abused: Not on file    Physically abused: Not on file    Forced sexual activity: Not on file  Other Topics Concern  . Not on file  Social History Narrative   Patient lives at home with her husband [name]).   Patient has 3 children and husbands has 4 children.   Patient has college education.   Patient is left-handed.   Patient drinks one cup of coffee in the am and two cups at night.        PHYSICAL EXAM  Vitals:   07/07/18 1259  BP: 120/82  Pulse: 78  Weight: 196 lb (88.9 kg)  Height: 5\' 3"  (1.6 m)   Body mass index is 34.72 kg/m.  Generalized: Well developed, in no acute distress  Head: normocephalic and atraumatic,. Oropharynx benign  Neck: Supple,  Musculoskeletal: No deformity   Neurological examination   Mentation: Alert oriented to time, place, history taking. Attention span and  concentration appropriate. Recent and remote memory intact.  Follows all commands speech and language fluent.   Cranial nerve II-XII: Fundoscopic exam reveals sharp disc margins.Pupils were equal round reactive to light extraocular movements were full, visual field were full on confrontational test. Facial sensation and strength were normal. hearing was intact to finger rubbing bilaterally. Uvula tongue midline. head turning and shoulder shrug were normal and symmetric.Tongue protrusion into cheek strength was normal. Motor: normal bulk and tone, full strength in the BUE, BLE,  Sensory: normal and symmetric to light touch, pinprick, and  Vibration, in the upper and lower extremities.  Coordination: finger-nose-finger, heel-to-shin bilaterally, no dysmetria Reflexes: Brachioradialis 2/2, biceps 2/2, triceps 2/2, patellar 2/2, Achilles 2/2, plantar responses were flexor bilaterally. Gait and Station: Rising up from seated position without assistance, normal stance,  moderate  stride, good arm swing, smooth turning, able to perform tiptoe, and heel walking without difficulty. Tandem gait is steady.  No assistive device DIAGNOSTIC DATA (LABS, IMAGING, TESTING) - I reviewed patient records, labs, notes, testing and imaging myself where available.  Lab Results  Component Value Date   WBC 7.5 01/04/2018   HGB 12.4 01/04/2018   HCT 37.5 01/04/2018   MCV 92 01/04/2018   PLT 307 01/04/2018      Component Value Date/Time   NA 147 (H) 01/04/2018 1335   K 3.7 01/04/2018 1335   CL 105 01/04/2018 1335   CO2 23 01/04/2018 1335   GLUCOSE 96 01/04/2018 1335   GLUCOSE 99 12/04/2014 0505   BUN 11 01/04/2018 1335   CREATININE 1.03 (H) 01/04/2018 1335   CALCIUM 9.6 01/04/2018 1335   PROT 7.5 01/04/2018 1335   ALBUMIN 4.4 01/04/2018 1335   AST 15 01/04/2018 1335   ALT 11 01/04/2018 1335   ALKPHOS 43 01/04/2018 1335   BILITOT 0.8 01/04/2018 1335   GFRNONAA 60 01/04/2018 1335   GFRAA 69 01/04/2018 1335     Lab Results  Component Value Date   VITAMINB12 945 11/22/2013   Lab Results  Component Value Date   TSH 1.990 07/07/2017      ASSESSMENT AND PLAN  62 y.o. year old female  has a past medical history of Encounter for long-term (current) use of other medications, High blood pressure, MS (multiple sclerosis) (HCC), and Thyroid disease. here to follow-up for relapsing remitting multiple sclerosis.  Patient is currently on tecfidera without relapses.  Last MRI of the brain 2017 we will repeat  Continue Tecfidera will refill Continue Neurontin will refill             CBC and CMP obtained today Repeat MRI of the brain and compare to 2017 Patient will follow-up in 6 months with Dr. Carmelina Noun, [facility], [facility], APRN  [facility] [address] [place], [place] [address] [phone]

## 2018-07-07 ENCOUNTER — Ambulatory Visit (INDEPENDENT_AMBULATORY_CARE_PROVIDER_SITE_OTHER): Payer: PPO | Admitting: Nurse Practitioner

## 2018-07-07 ENCOUNTER — Encounter: Payer: Self-pay | Admitting: Nurse Practitioner

## 2018-07-07 ENCOUNTER — Telehealth: Payer: Self-pay | Admitting: Nurse Practitioner

## 2018-07-07 VITALS — BP 120/82 | HR 78 | Ht 63.0 in | Wt 196.0 lb

## 2018-07-07 DIAGNOSIS — Z5181 Encounter for therapeutic drug level monitoring: Secondary | ICD-10-CM | POA: Diagnosis not present

## 2018-07-07 DIAGNOSIS — G35 Multiple sclerosis: Secondary | ICD-10-CM | POA: Diagnosis not present

## 2018-07-07 MED ORDER — GABAPENTIN 300 MG PO CAPS: ORAL_CAPSULE | ORAL | 1 refills | Status: DC

## 2018-07-07 MED ORDER — DIMETHYL FUMARATE 240 MG PO CPDR: 240.0000 mg | DELAYED_RELEASE_CAPSULE | Freq: Two times a day (BID) | ORAL | 2 refills | Status: DC

## 2018-07-07 NOTE — Telephone Encounter (Signed)
[facility] order sent to GI. No auth they will reach out to the pt to schedule.  °

## 2018-07-07 NOTE — Patient Instructions (Addendum)
Continue Tecfidera will refill Continue Neurontin will refill CBC and CMP obtained today MRI of the brain and compare to 2017 Patient will follow-up in 6 months with Dr. Terrace Arabia

## 2018-07-08 LAB — COMPREHENSIVE METABOLIC PANEL
A/G RATIO: 1.6 (ref 1.2–2.2)
ALT: 9 IU/L (ref 0–32)
AST: 13 IU/L (ref 0–40)
Albumin: 4.5 g/dL (ref 3.5–5.5)
Alkaline Phosphatase: 36 IU/L — ABNORMAL LOW (ref 39–117)
BUN/Creatinine Ratio: 12 (ref 9–23)
BUN: 11 mg/dL (ref 6–24)
Bilirubin Total: 0.6 mg/dL (ref 0.0–1.2)
CALCIUM: 9.7 mg/dL (ref 8.7–10.2)
CHLORIDE: 101 mmol/L (ref 96–106)
CO2: 24 mmol/L (ref 20–29)
CREATININE: 0.89 mg/dL (ref 0.57–1.00)
GFR calc non Af Amer: 71 mL/min/{1.73_m2} (ref >59)
GFR, EST AFRICAN AMERICAN: 82 mL/min/{1.73_m2} (ref >59)
Globulin, Total: 2.8 g/dL (ref 1.5–4.5)
Glucose: 86 mg/dL (ref 65–99)
Potassium: 4 mmol/L (ref 3.5–5.2)
Sodium: 140 mmol/L (ref 134–144)
TOTAL PROTEIN: 7.3 g/dL (ref 6.0–8.5)

## 2018-07-08 LAB — CBC WITH DIFFERENTIAL/PLATELET
BASOS ABS: 0 10*3/uL (ref 0.0–0.2)
Basos: 1 %
EOS (ABSOLUTE): 0 10*3/uL (ref 0.0–0.4)
Eos: 1 %
Hematocrit: 35.6 % (ref 34.0–46.6)
Hemoglobin: 12.3 g/dL (ref 11.1–15.9)
IMMATURE GRANULOCYTES: 0 %
Immature Grans (Abs): 0 10*3/uL (ref 0.0–0.1)
LYMPHS: 65 %
Lymphocytes Absolute: 2.3 10*3/uL (ref 0.7–3.1)
MCH: 30.4 pg (ref 26.6–33.0)
MCHC: 34.6 g/dL (ref 31.5–35.7)
MCV: 88 fL (ref 79–97)
Monocytes Absolute: 0.4 10*3/uL (ref 0.1–0.9)
Monocytes: 11 %
NEUTROS ABS: 0.8 10*3/uL — AB (ref 1.4–7.0)
NEUTROS PCT: 22 %
PLATELETS: 227 10*3/uL (ref 150–450)
RBC: 4.05 x10E6/uL (ref 3.77–5.28)
RDW: 13 % (ref 12.3–15.4)
WBC: 3.5 10*3/uL (ref 3.4–10.8)

## 2018-07-12 ENCOUNTER — Other Ambulatory Visit: Payer: No Typology Code available for payment source

## 2018-07-12 ENCOUNTER — Telehealth: Payer: Self-pay

## 2018-07-12 NOTE — Telephone Encounter (Signed)
-----   Message from Nilda Riggs, NP sent at 07/08/2018 10:42 AM EDT ----- Labs stable please call the patient

## 2018-07-12 NOTE — Telephone Encounter (Signed)
Spoke with the patient and she verbalized understanding her results. No questions at this time.  

## 2018-07-12 NOTE — Progress Notes (Signed)
I have reviewed and agreed above plan. 

## 2018-07-18 ENCOUNTER — Other Ambulatory Visit: Payer: No Typology Code available for payment source

## 2018-07-18 ENCOUNTER — Ambulatory Visit
Admission: RE | Admit: 2018-07-18 | Discharge: 2018-07-18 | Disposition: A | Discharge status: Home / Self Care | Payer: Self-pay | Source: Ambulatory Visit | Attending: Nurse Practitioner | Admitting: Nurse Practitioner

## 2018-07-18 DIAGNOSIS — G35 Multiple sclerosis: Secondary | ICD-10-CM

## 2018-07-19 ENCOUNTER — Telehealth: Payer: Self-pay | Admitting: *Deleted

## 2018-07-19 NOTE — Telephone Encounter (Signed)
Spoke with patient and informed her that her MRI of the brain is without acute findings. Advised there are no new lesions when compared to her MRI on 11/01/2016.  She verbalized understanding, appreciation.

## 2018-07-30 ENCOUNTER — Ambulatory Visit (INDEPENDENT_AMBULATORY_CARE_PROVIDER_SITE_OTHER): Payer: PPO

## 2018-07-30 ENCOUNTER — Ambulatory Visit (HOSPITAL_COMMUNITY)
Admission: EM | Admit: 2018-07-30 | Discharge: 2018-07-30 | Disposition: A | Discharge status: Home / Self Care | Payer: PPO | Attending: Family Medicine | Admitting: Family Medicine

## 2018-07-30 ENCOUNTER — Encounter (HOSPITAL_COMMUNITY): Payer: Self-pay | Admitting: Emergency Medicine

## 2018-07-30 DIAGNOSIS — M79602 Pain in left arm: Secondary | ICD-10-CM

## 2018-07-30 DIAGNOSIS — R2232 Localized swelling, mass and lump, left upper limb: Secondary | ICD-10-CM | POA: Diagnosis not present

## 2018-07-30 DIAGNOSIS — M19012 Primary osteoarthritis, left shoulder: Secondary | ICD-10-CM | POA: Diagnosis not present

## 2018-07-30 DIAGNOSIS — M25512 Pain in left shoulder: Secondary | ICD-10-CM | POA: Diagnosis not present

## 2018-07-30 MED ORDER — METHYLPREDNISOLONE ACETATE 40 MG/ML IJ SUSP
INTRAMUSCULAR | Status: AC
  Filled 2018-07-30: qty 1

## 2018-07-30 MED ORDER — METHYLPREDNISOLONE ACETATE 40 MG/ML IJ SUSP
40.0000 mg | Freq: Once | INTRAMUSCULAR | Status: AC
  Administered 2018-07-30: 40 mg via INTRAMUSCULAR

## 2018-07-30 MED ORDER — TIZANIDINE HCL 2 MG PO CAPS: 2.0000 mg | ORAL_CAPSULE | Freq: Three times a day (TID) | ORAL | 0 refills | Status: DC

## 2018-07-30 NOTE — Discharge Instructions (Signed)
It was nice meeting you!!  We gave you a steroid injection in the clinic for inflammation and swelling.  I am also giving you a low dose muscle relaxant.  Continue the gabapentin this could help.  You need to follow up with your doctor next week for a recheck. If you get worse follow up sooner.

## 2018-07-30 NOTE — ED Triage Notes (Signed)
Pt states for months shes had off and on L arm pain, denies injury. States shes been seen before for the same and given a steriod shot. Pt states her L shoulder hurts with movement.

## 2018-07-30 NOTE — ED Provider Notes (Signed)
[facility]    CSN: [id number] Arrival date & time: 07/30/18  1048     History   Chief Complaint Chief Complaint  Patient presents with  . Arm Pain    HPI Susan Gomez is a 62 y.o. female.   Patient is a 62 year old female with past medical history of high blood pressure, MS thyroid disease.  She presents with over a month of left shoulder, upper back pain radiating down the left arm.  She previously received a steroid injection for this pain but the pain has returned and worsened.  She is having some weakness in the left arm.  She is having some trapezius tenderness with swelling and left lateral neck swelling.  She is also having some left arm swelling.  She denies any numbness or tingling but the pain does radiate down her entire left arm.  She is unable to lift the arm due to pain.  She denies any fever, chills, body aches, fatigue.  She denies any headache, dizziness, weakness.  ROS per HPI      Past Medical History:  Diagnosis Date  . Encounter for long-term (current) use of other medications   . High blood pressure   . MS (multiple sclerosis) (HCC)   . Thyroid disease     Patient Active Problem List   Diagnosis Date Noted  . Abnormality of gait 04/16/2017  . Therapeutic drug monitoring 04/16/2017  . Abnormal finding on MRI of brain 10/10/2015  . Low back pain 10/10/2015  . Acute cholecystitis 12/03/2014  . Elevated LFTs, probable CBD stone 12/02/2014  . Calculus of gallbladder with biliary obstruction but without cholecystitis   . Hypokalemia   . Essential hypertension 12/01/2014  . Hypothyroid 12/01/2014  . Cholelithiasis 12/01/2014  . Multiple sclerosis (HCC) 09/23/2013  . HEMORRHOIDS-EXTERNAL 05/15/2009  . CONSTIPATION 05/15/2009  . ANAL OR RECTAL PAIN 05/15/2009  . ABDOMINAL PAIN-LLQ 05/15/2009  . PERSONAL HX COLONIC POLYPS 05/15/2009  . ANAL FISSURE 04/24/2009  . RECTAL BLEEDING 04/24/2009  . HEARTBURN 04/24/2009  . CHANGE IN BOWELS  04/24/2009    Past Surgical History:  Procedure Laterality Date  . KNEE SURGERY    . LAPAROSCOPIC CHOLECYSTECTOMY SINGLE SITE WITH INTRAOPERATIVE CHOLANGIOGRAM N/A 12/02/2014   Procedure: LAPAROSCOPIC CHOLECYSTECTOMY SINGLE SITE WITH INTRAOPERATIVE CHOLANGIOGRAM;  Surgeon: Karie Soda, MD;  Location: [facility];  Service: General;  Laterality: N/A;  . left thumb    . VAGINAL HYSTERECTOMY      OB History   None      Home Medications    Prior to Admission medications   Medication Sig Start Date End Date Taking? Authorizing Provider  Dimethyl Fumarate (TECFIDERA) 240 MG CPDR Take 1 capsule (240 mg total) by mouth 2 (two) times daily. 07/07/18   Nilda Riggs, NP  gabapentin (NEURONTIN) 300 MG capsule TAKE 2 CAPSULES THREE TIMES A DAY 07/07/18   Nilda Riggs, NP  losartan-hydrochlorothiazide (HYZAAR) 100-12.5 MG per tablet Take 1 tablet by mouth daily.  10/17/13   [provider]  SYNTHROID 112 MCG tablet Take 112 mcg by mouth every other day.  09/14/13   [provider]  tizanidine (ZANAFLEX) 2 MG capsule Take 1 capsule (2 mg total) by mouth 3 (three) times daily. 07/30/18   Janace Aris, NP    Family History Family History  Family history unknown: Yes    Social History Social History   Tobacco Use  . Smoking status: Never Smoker  . Smokeless tobacco: Never Used  Substance  Use Topics  . Alcohol use: No    Alcohol/week: 0.0 standard drinks  . Drug use: No     Allergies   Codeine   Review of Systems Review of Systems   Physical Exam Triage Vital Signs ED Triage Vitals [07/30/18 1135]  Enc Vitals Group     BP 137/82     Pulse Rate 64     Resp 16     Temp (!) 97.3 F (36.3 C)     Temp src      SpO2 100 %     Weight      Height      Head Circumference      Peak Flow      Pain Score      Pain Loc      Pain Edu?      Excl. in GC?    No data found.  Updated Vital Signs BP 137/82   Pulse 64   Temp (!) 97.3 F (36.3 C)    Resp 16   SpO2 100%   Visual Acuity Right Eye Distance:   Left Eye Distance:   Bilateral Distance:    Right Eye Near:   Left Eye Near:    Bilateral Near:     Physical Exam  Constitutional: She appears well-developed and well-nourished.  Appears in pain   HENT:  Head: Normocephalic and atraumatic.  Eyes: Conjunctivae are normal.  Neck: Normal range of motion.  No cervical bony tenderness. Good ROM of the neck.   Pulmonary/Chest: Effort normal.  Musculoskeletal: She exhibits edema and tenderness. She exhibits no deformity.  Unable to perform ROM exercises of the left shoulder due to pain. Swelling noted to the left lateral neck. Very tender to touch. Swelling also in the trapezius with moderate tenderness. Obvious swelling to the right arm. Grip strength weaker in the left arm.  Sensation and radial pulse intact. No bruising, deformity. No increased warmth or erythema.   Neurological: She is alert.  Skin: Skin is warm and dry. No rash noted. No erythema. No pallor.  Psychiatric: She has a normal mood and affect.  Nursing note and vitals reviewed.    UC Treatments / Results  Labs (all labs ordered are listed, but only abnormal results are displayed) Labs Reviewed - No data to display  EKG None  Radiology Dg Shoulder Left  Result Date: 07/30/2018 CLINICAL DATA:  Left shoulder pain extending down the left arm for months. No known injury. EXAM: LEFT SHOULDER - 2+ VIEW COMPARISON:  None. FINDINGS: Minimal acromioclavicular spur formation. Otherwise, normal appearing bones and soft tissues. IMPRESSION: Minimal acromioclavicular degenerative changes. Electronically Signed   By: Beckie Salts M.D.   On: 07/30/2018 12:56    Procedures Procedures (including critical care time)  Medications Ordered in UC Medications  methylPREDNISolone acetate (DEPO-MEDROL) injection 40 mg (has no administration in time range)    Initial Impression / Assessment and Plan / UC Course  I have  reviewed the triage vital signs and the nursing notes.  Pertinent labs & imaging results that were available during my care of the patient were reviewed by me and considered in my medical decision making (see chart for details).     Will treat inflammation and swelling with a steroid injection.   Will try low dose muscle relaxant to see if this helps.  Not sure if this problem is related to her MS. Will have her follow up with her PCP on Monday.   Final Clinical Impressions(s) /  UC Diagnoses   Final diagnoses:  Left arm pain     Discharge Instructions     It was nice meeting you!!  We gave you a steroid injection in the clinic for inflammation and swelling.  I am also giving you a low dose muscle relaxant.  Continue the gabapentin this could help.  You need to follow up with your doctor next week for a recheck. If you get worse follow up sooner.     ED Prescriptions    Medication Sig Dispense Auth. Provider   tizanidine (ZANAFLEX) 2 MG capsule Take 1 capsule (2 mg total) by mouth 3 (three) times daily. 30 capsule Dahlia Byes A, NP     Controlled Substance Prescriptions [place] Controlled Substance Registry consulted? Not Applicable   Janace Aris, NP 07/30/18 1334    Isa Rankin, MD 08/10/18 [phone]

## 2018-08-02 DIAGNOSIS — E039 Hypothyroidism, unspecified: Secondary | ICD-10-CM | POA: Diagnosis not present

## 2018-08-02 DIAGNOSIS — I10 Essential (primary) hypertension: Secondary | ICD-10-CM | POA: Diagnosis not present

## 2018-08-02 DIAGNOSIS — G35 Multiple sclerosis: Secondary | ICD-10-CM | POA: Diagnosis not present

## 2018-08-02 DIAGNOSIS — M25512 Pain in left shoulder: Secondary | ICD-10-CM | POA: Diagnosis not present

## 2018-08-02 DIAGNOSIS — Z0000 Encounter for general adult medical examination without abnormal findings: Secondary | ICD-10-CM | POA: Diagnosis not present

## 2018-09-14 DIAGNOSIS — H353212 Exudative age-related macular degeneration, right eye, with inactive choroidal neovascularization: Secondary | ICD-10-CM | POA: Diagnosis not present

## 2018-09-14 DIAGNOSIS — H35059 Retinal neovascularization, unspecified, unspecified eye: Secondary | ICD-10-CM | POA: Diagnosis not present

## 2018-09-14 DIAGNOSIS — H353131 Nonexudative age-related macular degeneration, bilateral, early dry stage: Secondary | ICD-10-CM | POA: Diagnosis not present

## 2018-09-14 DIAGNOSIS — H2512 Age-related nuclear cataract, left eye: Secondary | ICD-10-CM | POA: Diagnosis not present

## 2018-09-14 DIAGNOSIS — H2511 Age-related nuclear cataract, right eye: Secondary | ICD-10-CM | POA: Diagnosis not present

## 2018-10-14 DIAGNOSIS — J209 Acute bronchitis, unspecified: Secondary | ICD-10-CM | POA: Diagnosis not present

## 2018-10-14 DIAGNOSIS — I10 Essential (primary) hypertension: Secondary | ICD-10-CM | POA: Diagnosis not present

## 2018-10-14 DIAGNOSIS — R0789 Other chest pain: Secondary | ICD-10-CM | POA: Diagnosis not present

## 2018-10-29 ENCOUNTER — Other Ambulatory Visit: Payer: Self-pay | Admitting: *Deleted

## 2018-10-29 MED ORDER — DIMETHYL FUMARATE 240 MG PO CPDR: 240.0000 mg | DELAYED_RELEASE_CAPSULE | Freq: Two times a day (BID) | ORAL | 1 refills | Status: DC

## 2018-11-01 ENCOUNTER — Ambulatory Visit
Admission: RE | Admit: 2018-11-01 | Discharge: 2018-11-01 | Disposition: A | Discharge status: Home / Self Care | Payer: PPO | Source: Ambulatory Visit | Attending: Family Medicine | Admitting: Family Medicine

## 2018-11-01 ENCOUNTER — Other Ambulatory Visit: Payer: Self-pay | Admitting: Family Medicine

## 2018-11-01 DIAGNOSIS — R05 Cough: Secondary | ICD-10-CM

## 2018-11-01 DIAGNOSIS — R059 Cough, unspecified: Secondary | ICD-10-CM

## 2018-11-04 ENCOUNTER — Telehealth: Payer: Self-pay | Admitting: *Deleted

## 2018-11-04 NOTE — Telephone Encounter (Signed)
Received fax from [facility] at [email or web address] [phone]. ID # [id number]  Approvedd for grant thruough the [place] [employer].  Kennedy Bucker amount is $6000.00 and enrollment period is 10/02/2018 thru 10-02-2019.

## 2018-11-11 ENCOUNTER — Telehealth: Payer: Self-pay | Admitting: *Deleted

## 2018-11-11 NOTE — Telephone Encounter (Addendum)
Tecfidera PA Case: [id number], Status: Approved, Coverage Starts on: 11/11/2018 12:00:00 AM, Ends on: 11/11/2019 12:00:00 AM

## 2018-11-11 NOTE — Telephone Encounter (Addendum)
Started Tecfidera PA, [facility] on CMM. Key [id number]. [facility].  [facility] has received your information, and the request will be reviewed.  You will receive an electronic determination in CoverMyMeds. You can see the latest determination by locating this request on your dashboard or by reopening this request. You will also receive a faxed copy of the determination. If you have any questions please contact [facility] at [phone].

## 2018-11-23 ENCOUNTER — Telehealth: Payer: Self-pay | Admitting: *Deleted

## 2018-11-23 NOTE — Telephone Encounter (Signed)
Received fax form [facility] requesting a list of patient's current meds and allergies. I called [facility], spoke with Ignacia Bayley who stated because the patient is new they need confirmation she is taking Tecfidera and of her allergies. I faxed a med/allergy list to [facility].

## 2019-01-17 ENCOUNTER — Telehealth: Payer: Self-pay | Admitting: Neurology

## 2019-01-17 NOTE — Telephone Encounter (Signed)
Left message for her for potential rescheduling for January 18 2019

## 2019-01-18 ENCOUNTER — Other Ambulatory Visit: Payer: Self-pay

## 2019-01-18 ENCOUNTER — Ambulatory Visit: Payer: Medicare Other | Admitting: Neurology

## 2019-01-18 ENCOUNTER — Encounter: Payer: Self-pay | Admitting: Neurology

## 2019-01-18 VITALS — BP 138/95 | HR 60 | Ht 63.0 in | Wt 194.0 lb

## 2019-01-18 DIAGNOSIS — M5441 Lumbago with sciatica, right side: Secondary | ICD-10-CM | POA: Diagnosis not present

## 2019-01-18 DIAGNOSIS — G35 Multiple sclerosis: Secondary | ICD-10-CM | POA: Diagnosis not present

## 2019-01-18 DIAGNOSIS — G8929 Other chronic pain: Secondary | ICD-10-CM | POA: Insufficient documentation

## 2019-01-18 NOTE — Progress Notes (Signed)
[facility]  PATIENT: Susan Gomez DOB: 09-12-1956   REASON FOR VISIT: Follow-up for relapsing remitting multiple sclerosis HISTORY FROM: Patient    HISTORY OF PRESENT ILLNESS: Susan Gomez is a 63 year-old black female, referred by her primary care physician [name], to continue followup for her multiple sclerosis, and recent worsening of low back pain,   She is a prior patient of [name] since 2005. with diagnosis of relapsing remitting multiple sclerosis. The inital symptoms was facial asymetry, later also developed transient right leg weakness. The diagnosis is confirmed by abnormal MRI brain, cervical, and lumbar puncture showed more than 2 oligoclonal bands.   She has been on Betaseron every other day since 2005,. She denies any new onset symptoms or clear clinical flareups ever since, such as weakness, sensory changes, blurred vision or double vision, speech or swallowing problems, bowel or bladder difficulties, spasticity.   She complains of right side low back pain since 2000, right hip pain, shooting pain to right lateral thigh and leg, failed to improve by physical therapy, and stretching exercise. she denies incontinence  MRI brain in 2012 showed subtle confluent periventricular and subcortical white matter hyperintensitioes which are comaptible with demyelinating disease but appear unchanged from MRI 09/11/06. No enhancing lesions, T 1 black holes or significant atrophy is seen.  MRI scan of the lumbar spine shows only minor disc signal and facet degenerative changes without significant compression.  She has lost followup because of the financial burden, She has been out of Betaseron since 2012, there was no clear flareup in between,  She came back because of worsening bilateral lower extremity pain, shooting pain to both legs, continued low back pain, she has gait difficulty because of the pain, she denies bilateral feet or  hands paresthesia, she denies bowel and bladder incontinence,  She reported a history of right eye bleeding of unsure etiology in July 4th 2013, was under the care of opathamologist [name], just received intra-epidural injection yesterday, with right conjunctiva small hematoma,  She also complains of 3 months history of left lip spasm, intermittent, intermitted right parietal area headaches,  She is a recovered addict of alcohol, cocaine, prescription medications.  UPDATE Jan 20th 2015:YY She complains of right jaw pain, shooting, sharp, intermittent, worse at night, bring tears to her eyes, hurts when she swallow, talking. Her low back pain has much improved. She was awake at night because of right jaw pain. She did not have MRI brain as planned. She has no gait difficulty, no incontinence.   UPDATE Feb 20th 2015:YY She presented to emergency room for right jaw pain, was getting prescription of Percocet in Nov 23 2013. Now her right jaw pain has much improved, she continued to have mild to moderate.lower back pain, she denies significant gait difficulty MRI of brain in January 2015, also compared with previous MRI in 2007, there is mild increase in supratentorium lesion load, but there was no enhancing lesions, JC virus ab was negative, with titer 0.22, January 20th 2015.  UPDATE June 3rd 2015:YY She started on Plegridy since March 2015,she had total of 4 injections, last injection was May 27th, before that was May 13rd, she was trained by [employer] Nurse at home with her first injection. She has heart palpitation, flu-like illness, lasting one day, during 1st, 2nd injection, aleve was helping, during 3rd, 4th injections, she has more reactions, large size skin hives, sore, itching, involving her back, upper and lower extremity, dorsum hand. She  has taken aleve, benadryl, Claritin, and tried different creams, Benadryl, hydrocortisone creams, none of it stop her from  itchings.  UPDATE Dec 7th 2015:YY Her low back pain has much improved, only has mild midline low back pain, she denies shooting pain from her back to her lower extremity, she denies gait difficulty, she denies bowel and bladder incontinence, she is not on any immunomodulation therapy at this point   UPDATE Dec 7th 2016:YY she complains of feeling tired all the time, her husband went to hemodialysis, she denies significant gait difficulty, no visual loss,  I have reviewed her previous lumbar spine, mild degenerative changes no evidence of foraminal or canal stenosis  I personally reviewed and compared MRI brain w/wo in 2015 with 2007, overall only mild increase in supratentorium lesions, differentiation Diagnosis also including small vessel disease,  UPDATE Oct 14 2016:YY She has pain in her legs, sometimes difficulty walking. 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  UPDATE [id number]: She is tolerating Tecfidera since June 2018, doing well.  Her husband got kidney transplant, .  UPDATE January 04, 2018 (JV): Patient is seen today for a six-month follow-up.  Overall she is doing well without any new complaints.  Patient continues to take Tecfidera and Neurontin without side effects.  No new complaints at today's follow-up visit.  UPDATE January 18 2019: I personally reviewed MRI of the brain with and without contrast September 2019, multiple T2/flair hyperintensity foci, no contrast-enhancement, no change compared to previous scan in December 2017,  Laboratory evaluation September 2019, normal CMP, CBC, with absolute neutrophil 0.8,  She is overall doing well, complains 2 days history of right-sided low back pain, radiating pain to right leg, no bowel and bladder incontinence,  REVIEW OF SYSTEMS: Full 14 system review of systems performed and notable only for those listed, all others are neg:  Headaches, aching muscles, walking  difficulty, restless leg  All rest review of the system were negative  ALLERGIES: Allergies  Allergen Reactions  . Codeine Other (See Comments)    Recovering addict - wishes not to consume if possible    HOME MEDICATIONS: Outpatient Medications Prior to Visit  Medication Sig Dispense Refill  . Dimethyl Fumarate (TECFIDERA) 240 MG CPDR Take 1 capsule (240 mg total) by mouth 2 (two) times daily. 180 capsule 1  . gabapentin (NEURONTIN) 300 MG capsule TAKE 2 CAPSULES THREE TIMES A DAY 540 capsule 1  . losartan-hydrochlorothiazide (HYZAAR) 100-12.5 MG per tablet Take 1 tablet by mouth daily.     Marland Kitchen SYNTHROID 112 MCG tablet Take 112 mcg by mouth every other day.     . tizanidine (ZANAFLEX) 2 MG capsule Take 1 capsule (2 mg total) by mouth 3 (three) times daily. 30 capsule 0   No facility-administered medications prior to visit.     PAST MEDICAL HISTORY: Past Medical History:  Diagnosis Date  . Encounter for long-term (current) use of other medications   . High blood pressure   . MS (multiple sclerosis) (HCC)   . Thyroid disease     PAST SURGICAL HISTORY: Past Surgical History:  Procedure Laterality Date  . KNEE SURGERY    . LAPAROSCOPIC CHOLECYSTECTOMY SINGLE SITE WITH INTRAOPERATIVE CHOLANGIOGRAM N/A 12/02/2014   Procedure: LAPAROSCOPIC CHOLECYSTECTOMY SINGLE SITE WITH INTRAOPERATIVE CHOLANGIOGRAM;  Surgeon: Karie Soda, MD;  Location: [facility];  Service: General;  Laterality: N/A;  . left thumb    . VAGINAL HYSTERECTOMY  FAMILY HISTORY: Family History  Family history unknown: Yes    SOCIAL HISTORY: Social History   Socioeconomic History  . Marital status: Married    Spouse name: [name]  . Number of children: 3  . Years of education: 15+  . Highest education level: Not on file  Occupational History    Comment: Disabled  Social Needs  . Financial resource strain: Not on file  . Food insecurity:    Worry: Not on file    Inability: Not on file  . Transportation  needs:    Medical: Not on file    Non-medical: Not on file  Tobacco Use  . Smoking status: Never Smoker  . Smokeless tobacco: Never Used  Substance and Sexual Activity  . Alcohol use: No    Alcohol/week: 0.0 standard drinks  . Drug use: No  . Sexual activity: Not on file  Lifestyle  . Physical activity:    Days per week: Not on file    Minutes per session: Not on file  . Stress: Not on file  Relationships  . Social connections:    Talks on phone: Not on file    Gets together: Not on file    Attends religious service: Not on file    Active member of club or organization: Not on file    Attends meetings of clubs or organizations: Not on file    Relationship status: Not on file  . Intimate partner violence:    Fear of current or ex partner: Not on file    Emotionally abused: Not on file    Physically abused: Not on file    Forced sexual activity: Not on file  Other Topics Concern  . Not on file  Social History Narrative   Patient lives at home with her husband [name]).   Patient has 3 children and husbands has 4 children.   Patient has college education.   Patient is left-handed.   Patient drinks one cup of coffee in the am and two cups at night.        PHYSICAL EXAM  Vitals:   01/18/19 1258  Weight: 194 lb (88 kg)  Height: 5\' 3"  (1.6 m)   Body mass index is 34.37 kg/m.  Generalized: Well developed, in no acute distress  Head: normocephalic and atraumatic,. Oropharynx benign  Neck: Supple,  Musculoskeletal: No deformity   Neurological examination   Mentation: Alert oriented to time, place, history taking. Attention span and concentration appropriate. Recent and remote memory intact.  Follows all commands speech and language fluent.   Cranial nerve II-XII: Pupils were equal round reactive to light extraocular movements were full, visual field were full on confrontational test. Facial sensation and strength were normal. hearing was intact to finger rubbing  bilaterally. Uvula tongue midline. head turning and shoulder shrug were normal and symmetric.Tongue protrusion into cheek strength was normal. Motor: normal bulk and tone, full strength in the BUE, BLE,  Sensory: normal and symmetric to light touch, pinprick, and  Vibration, in the upper and lower extremities.  Coordination: finger-nose-finger, heel-to-shin bilaterally, no dysmetria Reflexes: Brachioradialis 2/2, biceps 2/2, triceps 2/2, patellar 2/2, Achilles 2/2, plantar responses were flexor bilaterally. Gait and Station: Rising up from seated position without assistance, mildly antalgic DIAGNOSTIC DATA (LABS, IMAGING, TESTING) - I reviewed patient records, labs, notes, testing and imaging myself where available.  Lab Results  Component Value Date   WBC 3.5 07/07/2018   HGB 12.3 07/07/2018   HCT 35.6 07/07/2018   MCV 88  07/07/2018   PLT 227 07/07/2018      Component Value Date/Time   NA 140 07/07/2018 1332   K 4.0 07/07/2018 1332   CL 101 07/07/2018 1332   CO2 24 07/07/2018 1332   GLUCOSE 86 07/07/2018 1332   GLUCOSE 99 12/04/2014 0505   BUN 11 07/07/2018 1332   CREATININE 0.89 07/07/2018 1332   CALCIUM 9.7 07/07/2018 1332   PROT 7.3 07/07/2018 1332   ALBUMIN 4.5 07/07/2018 1332   AST 13 07/07/2018 1332   ALT 9 07/07/2018 1332   ALKPHOS 36 (L) 07/07/2018 1332   BILITOT 0.6 07/07/2018 1332   GFRNONAA 71 07/07/2018 1332   GFRAA 82 07/07/2018 1332    Lab Results  Component Value Date   VITAMINB12 945 11/22/2013   Lab Results  Component Value Date   TSH 1.990 07/07/2017      ASSESSMENT AND PLAN  63 y.o. year old female   Relapsing remitting multiple sclerosis  Continue Tecfidera   Right-sided low back pain  Musculoskeletal etiology versus right lumbar radiculopathy  Normal examination, continue gabapentin, also advised her NSAIDs as needed, back stretching exercise,   Levert Feinstein, M.D. Ph.D.  [facility] [address] [place], [place]  [address] Phone: [phone] Fax:      [phone]

## 2019-03-15 DIAGNOSIS — H2511 Age-related nuclear cataract, right eye: Secondary | ICD-10-CM | POA: Diagnosis not present

## 2019-03-15 DIAGNOSIS — H35059 Retinal neovascularization, unspecified, unspecified eye: Secondary | ICD-10-CM | POA: Diagnosis not present

## 2019-03-15 DIAGNOSIS — H353212 Exudative age-related macular degeneration, right eye, with inactive choroidal neovascularization: Secondary | ICD-10-CM | POA: Diagnosis not present

## 2019-03-15 DIAGNOSIS — H353131 Nonexudative age-related macular degeneration, bilateral, early dry stage: Secondary | ICD-10-CM | POA: Diagnosis not present

## 2019-03-15 DIAGNOSIS — H2512 Age-related nuclear cataract, left eye: Secondary | ICD-10-CM | POA: Diagnosis not present

## 2019-06-02 ENCOUNTER — Encounter: Payer: Self-pay | Admitting: Internal Medicine

## 2019-07-21 ENCOUNTER — Ambulatory Visit: Payer: PPO | Admitting: Neurology

## 2019-07-25 ENCOUNTER — Other Ambulatory Visit: Payer: Self-pay | Admitting: *Deleted

## 2019-07-25 MED ORDER — DIMETHYL FUMARATE 240 MG PO CPDR: 240.0000 mg | DELAYED_RELEASE_CAPSULE | Freq: Two times a day (BID) | ORAL | 3 refills | Status: DC

## 2019-07-26 NOTE — Progress Notes (Signed)
Susan Gomez: Susan Gomez DOB: [birth year]  REASON FOR VISIT: follow up HISTORY FROM: Susan Gomez  HISTORY OF PRESENT ILLNESS: Today 07/27/19  HISTORY  HISTORY OF PRESENT ILLNESS: Ms. Susan Gomez is a 63 year-old black female, referred by her primary care physician Dr. Johny BlamerWilliam Gomez, to continue followup for her multiple sclerosis, and recent worsening of low back pain,   She is a prior Susan Gomez of Dr. Orlin Gomez since 2005. with diagnosis of relapsing remitting multiple sclerosis. The inital symptoms was facial asymetry, later also developed transient right leg weakness. The diagnosis is confirmed by abnormal MRI brain, cervical, and lumbar puncture showed more than 2 oligoclonal bands.   She has been on Betaseron every other day since 2005,. She denies any new onset symptoms or clear clinical flareups ever since, such as weakness, sensory changes, blurred vision or double vision, speech or swallowing problems, bowel or bladder difficulties, spasticity.   She complains of right side low back pain since 2000, right hip pain, shooting pain to right lateral thigh and leg, failed to improve by physical therapy, and stretching exercise. she denies incontinence  MRI brain in 2012 showed subtle confluent periventricular and subcortical white matter hyperintensitioes which are comaptible with demyelinating disease but appear unchanged from MRI 09/11/06. No enhancing lesions, T 1 black holes or significant atrophy is seen.  MRI scan of the lumbar spine shows only minor disc signal and facet degenerative changes without significant compression.  She has lost followup because of the financial burden, She has been out of Betaseron since 2012, there was no clear flareup in between,  She came back because of worsening bilateral lower extremity pain, shooting pain to both legs, continued low back pain, she has gait difficulty because of the pain, she denies bilateral feet or hands paresthesia, she  denies bowel and bladder incontinence,  She reported a history of right eye bleeding of unsure etiology in July 4th 2013, was under the care of opathamologist Dr. Harrie Gomez, just received intra-epidural injection yesterday, with right conjunctiva small hematoma,  She also complains of 3 months history of left lip spasm, intermittent, intermitted right parietal area headaches,  She is a recovered addict of alcohol, cocaine, prescription medications.  UPDATE Jan 20th 2015:YY She complains of right jaw pain, shooting, sharp, intermittent, worse at night, bring tears to her eyes, hurts when she swallow, talking. Her low back pain has much improved. She was awake at night because of right jaw pain. She did not have MRI brain as planned. She has no gait difficulty, no incontinence.   UPDATE Feb 20th 2015:YY She presented to emergency room for right jaw pain, was getting prescription of Percocet in Nov 23 2013. Now her right jaw pain has much improved, she continued to have mild to moderate.lower back pain, she denies significant gait difficulty MRI of brain in January 2015, also compared with previous MRI in 2007, there is mild increase in supratentorium lesion load, but there was no enhancing lesions, JC virus ab was negative, with titer 0.22, January 20th 2015.  UPDATE June 3rd 2015:YY She started on Plegridy since March 2015,she had total of 4 injections, last injection was May 27th, before that was May 13rd, she was trained by [employer] at home with her first injection. She has heart palpitation, flu-like illness, lasting one day, during 1st, 2nd injection, aleve was helping, during 3rd, 4th injections, she has more reactions, large size skin hives, sore, itching, involving her back, upper and lower extremity, dorsum hand. She has  taken aleve, benadryl, Claritin, and tried different creams, Benadryl, hydrocortisone creams, none of it stop her from itchings.  UPDATE Dec 7th  2015:YY Her low back pain has much improved, only has mild midline low back pain, she denies shooting pain from her back to her lower extremity, she denies gait difficulty, she denies bowel and bladder incontinence, she is not on any immunomodulation therapy at this point   UPDATE Dec 7th 2016:YY she complains of feeling tired all the time, her husband went to hemodialysis, she denies significant gait difficulty, no visual loss,  I have reviewed her previous lumbar spine, mild degenerative changes no evidence of foraminal or canal stenosis  I personally reviewed and compared MRI brain w/wo in 2015 with 2007, overall only mild increase in supratentorium lesions, differentiation Diagnosis also including small vessel disease,  UPDATE Oct 14 2016:YY She has pain in her legs, sometimes difficulty walking. 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  UPDATE [id number]: She is tolerating Tecfidera since June 2018, doing well.  Her husband got kidney transplant, .  UPDATE January 04, 2018 (JV): Susan Gomez is seen today for a six-month follow-up. Overall she is doing well without any new complaints. Susan Gomez continues to take Tecfidera and Neurontin without side effects. No new complaints at today's follow-up visit.  UPDATE January 18 2019: I personally reviewed MRI of the brain with and without contrast September 2019, multiple T2/flair hyperintensity foci, no contrast-enhancement, no change compared to previous scan in December 2017,  Laboratory evaluation September 2019, normal CMP, CBC, with absolute neutrophil 0.8,  She is overall doing well, complains 2 days history of right-sided low back pain, radiating pain to right leg, no bowel and bladder incontinence,  Update July 27, 2019 SS: She reports she is doing well.  She remains on Tecfidera.  She denies any new numbness/weakness in her arms or legs, or bowel or bladder incontinence.   She reports that if she gets stressed, she may have speech stuttering, word finding.  Her granddaughter reports sometimes she will miss pronouncing words.  She lives with her husband and family.  She walks on a daily basis 30-60 mins.  She has routine follow-up with her primary doctor.  She is prescribed gabapentin from this office for chronic back pain and leg pain.  She takes this on an as-needed basis.  She does not need a refill.  Last MRI of the brain in 2019 did not show any change from 2017.  She is alone at today's appointment.  REVIEW OF SYSTEMS: Out of a complete 14 system review of symptoms, the Susan Gomez complains only of the following symptoms, and all other reviewed systems are negative.  Ringing in ears  ALLERGIES: Allergies  Allergen Reactions  . Codeine Other (See Comments)    Recovering addict - wishes not to consume if possible    HOME MEDICATIONS: Outpatient Medications Prior to Visit  Medication Sig Dispense Refill  . Dimethyl Fumarate (TECFIDERA) 240 MG CPDR Take 1 capsule (240 mg total) by mouth 2 (two) times daily. 180 capsule 3  . gabapentin (NEURONTIN) 300 MG capsule TAKE 2 CAPSULES THREE TIMES A DAY 540 capsule 1  . losartan-hydrochlorothiazide (HYZAAR) 100-12.5 MG per tablet Take 1 tablet by mouth daily.     Marland Kitchen SYNTHROID 112 MCG tablet Take 112 mcg by mouth every other day.     . tizanidine (ZANAFLEX) 2 MG capsule Take 1 capsule (2 mg total) by mouth 3 (  three) times daily. 30 capsule 0   No facility-administered medications prior to visit.     PAST MEDICAL HISTORY: Past Medical History:  Diagnosis Date  . Encounter for long-term (current) use of other medications   . High blood pressure   . MS (multiple sclerosis) (HCC)   . Thyroid disease     PAST SURGICAL HISTORY: Past Surgical History:  Procedure Laterality Date  . KNEE SURGERY    . LAPAROSCOPIC CHOLECYSTECTOMY SINGLE SITE WITH INTRAOPERATIVE CHOLANGIOGRAM N/A 12/02/2014   Procedure: LAPAROSCOPIC  CHOLECYSTECTOMY SINGLE SITE WITH INTRAOPERATIVE CHOLANGIOGRAM;  Surgeon: Karie Soda, MD;  Location: [facility];  Service: General;  Laterality: N/A;  . left thumb    . VAGINAL HYSTERECTOMY      FAMILY HISTORY: Family History  Family history unknown: Yes    SOCIAL HISTORY: Social History   Socioeconomic History  . Marital status: Married    Spouse name: [name]  . Number of children: 3  . Years of education: 15+  . Highest education level: Not on file  Occupational History    Comment: Disabled  Social Needs  . Financial resource strain: Not on file  . Food insecurity    Worry: Not on file    Inability: Not on file  . Transportation needs    Medical: Not on file    Non-medical: Not on file  Tobacco Use  . Smoking status: Never Smoker  . Smokeless tobacco: Never Used  Substance and Sexual Activity  . Alcohol use: No    Alcohol/week: 0.0 standard drinks  . Drug use: No  . Sexual activity: Not on file  Lifestyle  . Physical activity    Days per week: Not on file    Minutes per session: Not on file  . Stress: Not on file  Relationships  . Social Musician on phone: Not on file    Gets together: Not on file    Attends religious service: Not on file    Active member of club or organization: Not on file    Attends meetings of clubs or organizations: Not on file    Relationship status: Not on file  . Intimate partner violence    Fear of current or ex partner: Not on file    Emotionally abused: Not on file    Physically abused: Not on file    Forced sexual activity: Not on file  Other Topics Concern  . Not on file  Social History Narrative   Susan Gomez lives at home with her husband [name]).   Susan Gomez has 3 children and husbands has 4 children.   Susan Gomez has college education.   Susan Gomez is left-handed.   Susan Gomez drinks one cup of coffee in the am and two cups at night.       PHYSICAL EXAM  Vitals:   07/27/19 0850  Temp: (!) 96.6 F (35.9 C)  Weight: 194  lb 9.6 oz (88.3 kg)  Height: 5\' 4"  (1.626 m)   Body mass index is 33.4 kg/m.  Generalized: Well developed, in no acute distress   Neurological examination  Mentation: Alert oriented to time, place, history taking. Follows all commands speech and language fluent Cranial nerve II-XII: Pupils were equal round reactive to light. Extraocular movements were full, visual field were full on confrontational test. Facial sensation and strength were normal.  Head turning and shoulder shrug  were normal and symmetric. Motor: The motor testing reveals 5 over 5 strength of all 4 extremities. Good symmetric motor  tone is noted throughout.  Sensory: Sensory testing is intact to soft touch on all 4 extremities. No evidence of extinction is noted.  Coordination: Cerebellar testing reveals good finger-nose-finger and heel-to-shin bilaterally.  Gait and station: Rises from seated not assistance. Gait is normal. Tandem gait is normal. Romberg is negative. No drift is seen.  Reflexes: Deep tendon reflexes are symmetric and normal bilaterally.   DIAGNOSTIC DATA (LABS, IMAGING, TESTING) - I reviewed Susan Gomez records, labs, notes, testing and imaging myself where available.  Lab Results  Component Value Date   WBC 3.5 07/07/2018   HGB 12.3 07/07/2018   HCT 35.6 07/07/2018   MCV 88 07/07/2018   PLT 227 07/07/2018      Component Value Date/Time   NA 140 07/07/2018 1332   K 4.0 07/07/2018 1332   CL 101 07/07/2018 1332   CO2 24 07/07/2018 1332   GLUCOSE 86 07/07/2018 1332   GLUCOSE 99 12/04/2014 0505   BUN 11 07/07/2018 1332   CREATININE 0.89 07/07/2018 1332   CALCIUM 9.7 07/07/2018 1332   PROT 7.3 07/07/2018 1332   ALBUMIN 4.5 07/07/2018 1332   AST 13 07/07/2018 1332   ALT 9 07/07/2018 1332   ALKPHOS 36 (L) 07/07/2018 1332   BILITOT 0.6 07/07/2018 1332   GFRNONAA 71 07/07/2018 1332   GFRAA 82 07/07/2018 1332   No results found for: CHOL, HDL, LDLCALC, LDLDIRECT, TRIG, CHOLHDL No results found  for: HGBA1C Lab Results  Component Value Date   VITAMINB12 945 11/22/2013   Lab Results  Component Value Date   TSH 1.990 07/07/2017    ASSESSMENT AND PLAN 63 y.o. year old female  has a past medical history of Encounter for long-term (current) use of other medications, High blood pressure, MS (multiple sclerosis) (HCC), and Thyroid disease. here with:  1.  Relapsing remitting multiple sclerosis -Overall, stable, doing well -Continue Tecfidera -I will check routine lab work today -MRI of the brain 07/18/2018 was unchanged from 2017, no acute findings -Repeat MRI of the brain in 2021 -Encouraged her to continue daily walking for exercise -Follow-up in 6 months or sooner if needed  2.  Right-sided low back pain, leg pain -Musculoskeletal etiology versus right lumbar radiculopathy -Continue gabapentin, only takes on an as-needed basis, does not need a refill today  I spent 15 minutes with the Susan Gomez. 50% of this time was spent discussing her plan of care.  Margie Ege, AGNP-C, DNP 07/27/2019, 8:53 AM [facility] [address], [address] [place], [place] [address] [phone]

## 2019-07-27 ENCOUNTER — Other Ambulatory Visit: Payer: Self-pay

## 2019-07-27 ENCOUNTER — Encounter: Payer: Self-pay | Admitting: Neurology

## 2019-07-27 ENCOUNTER — Ambulatory Visit (INDEPENDENT_AMBULATORY_CARE_PROVIDER_SITE_OTHER): Payer: PPO | Admitting: Neurology

## 2019-07-27 VITALS — BP 128/88 | HR 55 | Temp 96.6°F | Ht 64.0 in | Wt 194.6 lb

## 2019-07-27 DIAGNOSIS — G35 Multiple sclerosis: Secondary | ICD-10-CM | POA: Diagnosis not present

## 2019-07-27 NOTE — Patient Instructions (Signed)
1. Continue Tecfidera 2. Check lab work today 3. Continue gabapentin as needed

## 2019-07-28 ENCOUNTER — Telehealth: Payer: Self-pay | Admitting: *Deleted

## 2019-07-28 LAB — CBC WITH DIFFERENTIAL/PLATELET
Basophils Absolute: 0.1 10*3/uL (ref 0.0–0.2)
Basos: 1 %
EOS (ABSOLUTE): 0 10*3/uL (ref 0.0–0.4)
Eos: 1 %
Hematocrit: 36.1 % (ref 34.0–46.6)
Hemoglobin: 12.4 g/dL (ref 11.1–15.9)
Immature Grans (Abs): 0 10*3/uL (ref 0.0–0.1)
Immature Granulocytes: 0 %
Lymphocytes Absolute: 2.3 10*3/uL (ref 0.7–3.1)
Lymphs: 53 %
MCH: 31.3 pg (ref 26.6–33.0)
MCHC: 34.3 g/dL (ref 31.5–35.7)
MCV: 91 fL (ref 79–97)
Monocytes Absolute: 0.6 10*3/uL (ref 0.1–0.9)
Monocytes: 14 %
Neutrophils Absolute: 1.3 10*3/uL — ABNORMAL LOW (ref 1.4–7.0)
Neutrophils: 31 %
Platelets: 257 10*3/uL (ref 150–450)
RBC: 3.96 x10E6/uL (ref 3.77–5.28)
RDW: 13 % (ref 11.7–15.4)
WBC: 4.3 10*3/uL (ref 3.4–10.8)

## 2019-07-28 LAB — COMPREHENSIVE METABOLIC PANEL
ALT: 11 IU/L (ref 0–32)
AST: 14 IU/L (ref 0–40)
Albumin/Globulin Ratio: 1.5 (ref 1.2–2.2)
Albumin: 4.1 g/dL (ref 3.8–4.9)
Alkaline Phosphatase: 40 IU/L (ref 39–117)
BUN/Creatinine Ratio: 14 (ref 12–28)
BUN: 11 mg/dL (ref 8–27)
Bilirubin Total: 0.7 mg/dL (ref 0.0–1.2)
CO2: 24 mmol/L (ref 20–29)
Calcium: 9.6 mg/dL (ref 8.7–10.3)
Chloride: 100 mmol/L (ref 96–106)
Creatinine, Ser: 0.78 mg/dL (ref 0.57–1.00)
GFR calc Af Amer: 96 mL/min/{1.73_m2} (ref >59)
GFR calc non Af Amer: 83 mL/min/{1.73_m2} (ref >59)
Globulin, Total: 2.8 g/dL (ref 1.5–4.5)
Glucose: 94 mg/dL (ref 65–99)
Potassium: 3.6 mmol/L (ref 3.5–5.2)
Sodium: 137 mmol/L (ref 134–144)
Total Protein: 6.9 g/dL (ref 6.0–8.5)

## 2019-07-28 NOTE — Telephone Encounter (Signed)
LVM informing patient she has normal lab work while taking Tecfidera. Left number for questions.

## 2019-07-28 NOTE — Progress Notes (Signed)
I have reviewed and agreed above plan. 

## 2019-08-08 DIAGNOSIS — E7800 Pure hypercholesterolemia, unspecified: Secondary | ICD-10-CM | POA: Diagnosis not present

## 2019-08-08 DIAGNOSIS — Z0000 Encounter for general adult medical examination without abnormal findings: Secondary | ICD-10-CM | POA: Diagnosis not present

## 2019-08-08 DIAGNOSIS — Z1211 Encounter for screening for malignant neoplasm of colon: Secondary | ICD-10-CM | POA: Diagnosis not present

## 2019-08-08 DIAGNOSIS — G35 Multiple sclerosis: Secondary | ICD-10-CM | POA: Diagnosis not present

## 2019-08-08 DIAGNOSIS — E039 Hypothyroidism, unspecified: Secondary | ICD-10-CM | POA: Diagnosis not present

## 2019-08-08 DIAGNOSIS — I10 Essential (primary) hypertension: Secondary | ICD-10-CM | POA: Diagnosis not present

## 2019-08-24 DIAGNOSIS — E7800 Pure hypercholesterolemia, unspecified: Secondary | ICD-10-CM | POA: Diagnosis not present

## 2019-08-24 DIAGNOSIS — E039 Hypothyroidism, unspecified: Secondary | ICD-10-CM | POA: Diagnosis not present

## 2019-08-31 ENCOUNTER — Encounter: Payer: Self-pay | Admitting: Internal Medicine

## 2019-09-01 DIAGNOSIS — E039 Hypothyroidism, unspecified: Secondary | ICD-10-CM | POA: Diagnosis not present

## 2019-09-01 DIAGNOSIS — E785 Hyperlipidemia, unspecified: Secondary | ICD-10-CM | POA: Diagnosis not present

## 2019-09-01 DIAGNOSIS — I10 Essential (primary) hypertension: Secondary | ICD-10-CM | POA: Diagnosis not present

## 2019-09-15 DIAGNOSIS — Z1211 Encounter for screening for malignant neoplasm of colon: Secondary | ICD-10-CM | POA: Diagnosis not present

## 2019-10-12 ENCOUNTER — Encounter: Payer: PPO | Admitting: Internal Medicine

## 2019-11-02 DIAGNOSIS — E785 Hyperlipidemia, unspecified: Secondary | ICD-10-CM | POA: Diagnosis not present

## 2019-11-02 DIAGNOSIS — E039 Hypothyroidism, unspecified: Secondary | ICD-10-CM | POA: Diagnosis not present

## 2019-11-02 DIAGNOSIS — I10 Essential (primary) hypertension: Secondary | ICD-10-CM | POA: Diagnosis not present

## 2019-11-15 DIAGNOSIS — H2512 Age-related nuclear cataract, left eye: Secondary | ICD-10-CM | POA: Diagnosis not present

## 2019-11-15 DIAGNOSIS — H353212 Exudative age-related macular degeneration, right eye, with inactive choroidal neovascularization: Secondary | ICD-10-CM | POA: Diagnosis not present

## 2019-11-15 DIAGNOSIS — H2511 Age-related nuclear cataract, right eye: Secondary | ICD-10-CM | POA: Diagnosis not present

## 2019-11-15 DIAGNOSIS — H353131 Nonexudative age-related macular degeneration, bilateral, early dry stage: Secondary | ICD-10-CM | POA: Diagnosis not present

## 2019-11-15 DIAGNOSIS — H35059 Retinal neovascularization, unspecified, unspecified eye: Secondary | ICD-10-CM | POA: Diagnosis not present

## 2019-11-29 DIAGNOSIS — U071 COVID-19: Secondary | ICD-10-CM | POA: Diagnosis not present

## 2020-01-23 NOTE — Progress Notes (Signed)
Susan Gomez: Susan Gomez DOB: 1956-07-26  REASON FOR VISIT: follow up HISTORY FROM: Susan Gomez  HISTORY OF PRESENT ILLNESS: Today 01/24/20  HISTORY  HISTORY OF PRESENT ILLNESS: Susan Gomez is a 64 year-old black female, referred by her primary care physician Susan Gomez, to continue followup for her multiple sclerosis, and recent worsening of low back pain,   She is a prior Susan Gomez of Susan Gomez since 2005. with diagnosis of relapsing remitting multiple sclerosis. The inital symptoms was facial asymetry, later also developed transient right leg weakness. The diagnosis is confirmed by abnormal MRI brain, cervical, and lumbar puncture showed more than 2 oligoclonal bands.   She has been on Betaseron every other day since 2005,. She denies any new onset symptoms or clear clinical flareups ever since, such as weakness, sensory changes, blurred vision or double vision, speech or swallowing problems, bowel or bladder difficulties, spasticity.   She complains of right side low back pain since 2000, right hip pain, shooting pain to right lateral thigh and leg, failed to improve by physical therapy, and stretching exercise. she denies incontinence  MRI brain in 2012 showed subtle confluent periventricular and subcortical white matter hyperintensitioes which are comaptible with demyelinating disease but appear unchanged from MRI 09/11/06. No enhancing lesions, T 1 black holes or significant atrophy is seen.  MRI scan of the lumbar spine shows only minor disc signal and facet degenerative changes without significant compression.  She has lost followup because of the financial burden, She has been out of Betaseron since 2012, there was no clear flareup in between,  She came back because of worsening bilateral lower extremity pain, shooting pain to both legs, continued low back pain, she has gait difficulty because of the pain, she denies bilateral feet or hands paresthesia, she  denies bowel and bladder incontinence,  She reported a history of right eye bleeding of unsure etiology in July 4th 2013, was under the care of opathamologist Susan Gomez, just received intra-epidural injection yesterday, with right conjunctiva small hematoma,  She also complains of 3 months history of left lip spasm, intermittent, intermitted right parietal area headaches,  She is a recovered addict of alcohol, cocaine, prescription medications.  UPDATE Jan 20th 2015:YY She complains of right jaw pain, shooting, sharp, intermittent, worse at night, bring tears to her eyes, hurts when she swallow, talking. Her low back pain has much improved. She was awake at night because of right jaw pain. She did not have MRI brain as planned. She has no gait difficulty, no incontinence.   UPDATE Feb 20th 2015:YY She presented to emergency room for right jaw pain, was getting prescription of Percocet in Nov 23 2013. Now her right jaw pain has much improved, she continued to have mild to moderate.lower back pain, she denies significant gait difficulty MRI of brain in January 2015, also compared with previous MRI in 2007, there is mild increase in supratentorium lesion load, but there was no enhancing lesions, JC virus ab was negative, with titer 0.22, January 20th 2015.  UPDATE June 3rd 2015:YY She started on Plegridy since March 2015,she had total of 4 injections, last injection was May 27th, before that was May 13rd, she was trained by Susan Gomez at home with her first injection. She has heart palpitation, flu-like illness, lasting one day, during 1st, 2nd injection, aleve was helping, during 3rd, 4th injections, she has more reactions, large size skin hives, sore, itching, involving her back, upper and lower extremity, dorsum hand. She has  taken aleve, benadryl, Claritin, and tried different creams, Benadryl, hydrocortisone creams, none of it stop her from itchings.  UPDATE Dec 7th  2015:YY Her low back pain has much improved, only has mild midline low back pain, she denies shooting pain from her back to her lower extremity, she denies gait difficulty, she denies bowel and bladder incontinence, she is not on any immunomodulation therapy at this point   UPDATE Dec 7th 2016:YY she complains of feeling tired all the time, her husband went to hemodialysis, she denies significant gait difficulty, no visual loss,  I have reviewed her previous lumbar spine, mild degenerative changes no evidence of foraminal or canal stenosis  I personally reviewed and compared MRI brain w/wo in 2015 with 2007, overall only mild increase in supratentorium lesions, differentiation Diagnosis also including small vessel disease,  UPDATE Oct 14 2016:YY She has pain in her legs, sometimes difficulty walking. 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  UPDATE [id number]: She is tolerating Tecfidera since June 2018, doing well.  Her husband got kidney transplant, .  UPDATE January 04, 2018 (JV): Susan Gomez is seen today for a six-month follow-up. Overall she is doing well without any new complaints. Susan Gomez continues to take Tecfidera and Neurontin without side effects. No new complaints at today's follow-up visit.  UPDATE January 18 2019: I personally reviewed MRI of the brain with and without contrast September 2019, multiple T2/flair hyperintensity foci, no contrast-enhancement, no change compared to previous scan in December 2017,  Laboratory evaluation September 2019, normal CMP, CBC, with absolute neutrophil 0.8,  She is overall doing well,complains 2 days history of right-sided low back pain, radiating pain to right leg, no bowel and bladder incontinence,  Update July 27, 2019 SS: She reports she is doing well.  She remains on Tecfidera.  She denies any new numbness/weakness in her arms or legs, or bowel or bladder incontinence.   She reports that if she gets stressed, she may have speech stuttering, word finding.  Her granddaughter reports sometimes she will miss pronouncing words.  She lives with her husband and family.  She walks on a daily basis 30-60 mins.  She has routine follow-up with her primary doctor.  She is prescribed gabapentin from this office for chronic back pain and leg pain.  She takes this on an as-needed basis.  She does not need a refill.  Last MRI of the brain in 2019 did not show any change from 2017.  She is alone at today's appointment.  Update January 24, 2020 SS: Here today alone for follow-up.  She remains on Tecfidera.  She is doing home fitness exercise videos, notices when she stands on one leg, the right leg feels weaker.  No changes to gait, no falls.  No changes to bowels or bladder, revision.  She takes gabapentin as needed for chronic low back and leg pain on the right.  Her husband is set to have surgery soon.  She wishes to hold off on MRI of the brain, due to financial concerns.  REVIEW OF SYSTEMS: Out of a complete 14 system review of symptoms, the Susan Gomez complains only of the following symptoms, and all other reviewed systems are negative.  weakness  ALLERGIES: Allergies  Allergen Reactions  . Codeine Other (See Comments)    Recovering addict - wishes not to consume if possible    HOME MEDICATIONS: Outpatient Medications Prior to Visit  Medication Sig Dispense Refill  .  Dimethyl Fumarate (TECFIDERA) 240 MG CPDR Take 1 capsule (240 mg total) by mouth 2 (two) times daily. 180 capsule 3  . gabapentin (NEURONTIN) 300 MG capsule TAKE 2 CAPSULES THREE TIMES A DAY (Susan Gomez taking differently: as needed. TAKE 2 CAPSULES THREE TIMES A DAY) 540 capsule 1  . losartan-hydrochlorothiazide (HYZAAR) 100-12.5 MG per tablet Take 1 tablet by mouth daily.     Marland Kitchen SYNTHROID 112 MCG tablet Take 112 mcg by mouth every other day.     Marland Kitchen azelastine (ASTELIN) 0.1 % nasal spray Place 1 spray into both  nostrils 2 (two) times daily.    . ondansetron (ZOFRAN-ODT) 8 MG disintegrating tablet Take 8 mg by mouth 3 (three) times daily as needed.    . promethazine-dextromethorphan (PROMETHAZINE-DM) 6.25-15 MG/5ML syrup Take 5 mLs by mouth every 6 (six) hours as needed.     No facility-administered medications prior to visit.    PAST MEDICAL HISTORY: Past Medical History:  Diagnosis Date  . Encounter for long-term (current) use of other medications   . High blood pressure   . MS (multiple sclerosis) (HCC)   . Thyroid disease     PAST SURGICAL HISTORY: Past Surgical History:  Procedure Laterality Date  . KNEE SURGERY    . LAPAROSCOPIC CHOLECYSTECTOMY SINGLE SITE WITH INTRAOPERATIVE CHOLANGIOGRAM N/A 12/02/2014   Procedure: LAPAROSCOPIC CHOLECYSTECTOMY SINGLE SITE WITH INTRAOPERATIVE CHOLANGIOGRAM;  Surgeon: Karie Soda, MD;  Location: [facility];  Service: General;  Laterality: N/A;  . left thumb    . VAGINAL HYSTERECTOMY      FAMILY HISTORY: Family History  Family history unknown: Yes    SOCIAL HISTORY: Social History   Socioeconomic History  . Marital status: Married    Spouse name: [name]  . Number of children: 3  . Years of education: 15+  . Highest education level: Not on file  Occupational History    Comment: Disabled  Tobacco Use  . Smoking status: Never Smoker  . Smokeless tobacco: Never Used  Substance and Sexual Activity  . Alcohol use: No    Alcohol/week: 0.0 standard drinks  . Drug use: No  . Sexual activity: Not on file  Other Topics Concern  . Not on file  Social History Narrative   Susan Gomez lives at home with her husband [name]).   Susan Gomez has 3 children and husbands has 4 children.   Susan Gomez has college education.   Susan Gomez is left-handed.   Susan Gomez drinks one cup of coffee in the am and two cups at night.      Social Determinants of Health   Financial Resource Strain:   . Difficulty of Paying Living Expenses:   Food Insecurity:   . Worried About  Programme researcher, broadcasting/film/video in the Last Year:   . Barista in the Last Year:   Transportation Needs:   . Freight forwarder (Medical):   Marland Kitchen Lack of Transportation (Non-Medical):   Physical Activity:   . Days of Exercise per Week:   . Minutes of Exercise per Session:   Stress:   . Feeling of Stress :   Social Connections:   . Frequency of Communication with Friends and Family:   . Frequency of Social Gatherings with Friends and Family:   . Attends Religious Services:   . Active Member of Clubs or Organizations:   . Attends Banker Meetings:   Marland Kitchen Marital Status:   Intimate Partner Violence:   . Fear of Current or Ex-Partner:   . Emotionally Abused:   .  Physically Abused:   . Sexually Abused:    PHYSICAL EXAM  Vitals:   01/24/20 0859  BP: (!) 161/91  Pulse: 65  Temp: (!) 97.5 F (36.4 C)  Weight: 195 lb 12.8 oz (88.8 kg)  Height: 5\' 3"  (1.6 m)   Body mass index is 34.68 kg/m.  Generalized: Well developed, in no acute distress   Neurological examination  Mentation: Alert oriented to time, place, history taking. Follows all commands speech and language fluent Cranial nerve II-XII: Pupils were equal round reactive to light. Extraocular movements were full, visual field were full on confrontational test. Facial sensation and strength were normal. Head turning and shoulder shrug  were normal and symmetric. Motor: The motor testing reveals 5 over 5 strength of all 4 extremities. Good symmetric motor tone is noted throughout. With right hip flexion, mid pain to low back Sensory: Sensory testing is intact to soft touch on all 4 extremities. No evidence of extinction is noted.  Coordination: Cerebellar testing reveals good finger-nose-finger and heel-to-shin bilaterally.  Gait and station: Gait is normal. Tandem gait is slightly unsteady. Romberg is negative. No drift is seen.  Reflexes: Deep tendon reflexes are symmetric and normal bilaterally.   DIAGNOSTIC DATA  (LABS, IMAGING, TESTING) - I reviewed Susan Gomez records, labs, notes, testing and imaging myself where available.  Lab Results  Component Value Date   WBC 4.3 07/27/2019   HGB 12.4 07/27/2019   HCT 36.1 07/27/2019   MCV 91 07/27/2019   PLT 257 07/27/2019      Component Value Date/Time   NA 137 07/27/2019 0912   K 3.6 07/27/2019 0912   CL 100 07/27/2019 0912   CO2 24 07/27/2019 0912   GLUCOSE 94 07/27/2019 0912   GLUCOSE 99 12/04/2014 0505   BUN 11 07/27/2019 0912   CREATININE 0.78 07/27/2019 0912   CALCIUM 9.6 07/27/2019 0912   PROT 6.9 07/27/2019 0912   ALBUMIN 4.1 07/27/2019 0912   AST 14 07/27/2019 0912   ALT 11 07/27/2019 0912   ALKPHOS 40 07/27/2019 0912   BILITOT 0.7 07/27/2019 0912   GFRNONAA 83 07/27/2019 0912   GFRAA 96 07/27/2019 0912   No results found for: CHOL, HDL, LDLCALC, LDLDIRECT, TRIG, CHOLHDL No results found for: 07/29/2019 Lab Results  Component Value Date   VITAMINB12 945 11/22/2013   Lab Results  Component Value Date   TSH 1.990 07/07/2017   ASSESSMENT AND PLAN 64 y.o. year old female  has a past medical history of Encounter for long-term (current) use of other medications, High blood pressure, MS (multiple sclerosis) (HCC), and Thyroid disease. here with:  1.  Relapsing remitting multiple sclerosis -Overall, stable, noted mild right leg weakness with exercise, standing on 1 leg, gait is doing well  -Continue Tecfidera, tolerating well -Check blood work today CBC, CMP for adverse effects -Discussed repeat MRI of the brain, Susan Gomez wishes to hold off as her husband is having surgery, financial concerns -MRI of the brain 07/18/2018 appeared stable from 2017 -Monitor symptoms, call for change or worsening   2.  Right-sided low back pain, leg pain -Musculoskeletal etiology versus right lumbar radiculopathy -Continue gabapentin 300 mg twice a day as needed (refilled) -Encouraged to continue exercise, stretching -She will follow-up in 6 months or  sooner if needed   I spent 20 minutes of face-to-face and non-face-to-face time with Susan Gomez.  This included previsit chart review, lab review, study review, order entry, electronic health record documentation, Susan Gomez education.   2018, AGNP-C, DNP 01/24/2020, 9:08  AM [facility] [address] [place], [place] [address] [phone]

## 2020-01-24 ENCOUNTER — Ambulatory Visit: Payer: PPO | Admitting: Neurology

## 2020-01-24 ENCOUNTER — Encounter: Payer: Self-pay | Admitting: Neurology

## 2020-01-24 ENCOUNTER — Other Ambulatory Visit: Payer: Self-pay

## 2020-01-24 VITALS — BP 161/91 | HR 65 | Temp 97.5°F | Ht 63.0 in | Wt 195.8 lb

## 2020-01-24 DIAGNOSIS — G8929 Other chronic pain: Secondary | ICD-10-CM

## 2020-01-24 DIAGNOSIS — G35 Multiple sclerosis: Secondary | ICD-10-CM

## 2020-01-24 DIAGNOSIS — M5441 Lumbago with sciatica, right side: Secondary | ICD-10-CM | POA: Diagnosis not present

## 2020-01-24 MED ORDER — GABAPENTIN 300 MG PO CAPS: 300.0000 mg | ORAL_CAPSULE | Freq: Two times a day (BID) | ORAL | 3 refills | Status: DC | PRN

## 2020-01-24 MED ORDER — DIMETHYL FUMARATE 240 MG PO CPDR: 240.0000 mg | DELAYED_RELEASE_CAPSULE | Freq: Two times a day (BID) | ORAL | 3 refills | Status: DC

## 2020-01-24 NOTE — Patient Instructions (Signed)
Continue the Tecfidera Take the gabapentin has needed for the back and hip pain  Check blood work today  Plan for MRI at next visit  Call for worsening symptoms See you in 6 months

## 2020-01-24 NOTE — Progress Notes (Signed)
I have reviewed and agreed above plan. 

## 2020-01-25 LAB — CBC WITH DIFFERENTIAL/PLATELET
Basophils Absolute: 0.1 10*3/uL (ref 0.0–0.2)
Basos: 2 %
EOS (ABSOLUTE): 0 10*3/uL (ref 0.0–0.4)
Eos: 1 %
Hematocrit: 35.5 % (ref 34.0–46.6)
Hemoglobin: 12.1 g/dL (ref 11.1–15.9)
Immature Grans (Abs): 0 10*3/uL (ref 0.0–0.1)
Immature Granulocytes: 0 %
Lymphocytes Absolute: 2.1 10*3/uL (ref 0.7–3.1)
Lymphs: 54 %
MCH: 31.3 pg (ref 26.6–33.0)
MCHC: 34.1 g/dL (ref 31.5–35.7)
MCV: 92 fL (ref 79–97)
Monocytes Absolute: 0.4 10*3/uL (ref 0.1–0.9)
Monocytes: 11 %
Neutrophils Absolute: 1.2 10*3/uL — ABNORMAL LOW (ref 1.4–7.0)
Neutrophils: 32 %
Platelets: 242 10*3/uL (ref 150–450)
RBC: 3.86 x10E6/uL (ref 3.77–5.28)
RDW: 13.8 % (ref 11.7–15.4)
WBC: 3.9 10*3/uL (ref 3.4–10.8)

## 2020-01-25 LAB — COMPREHENSIVE METABOLIC PANEL
ALT: 11 IU/L (ref 0–32)
AST: 18 IU/L (ref 0–40)
Albumin/Globulin Ratio: 1.8 (ref 1.2–2.2)
Albumin: 4.4 g/dL (ref 3.8–4.8)
Alkaline Phosphatase: 39 IU/L (ref 39–117)
BUN/Creatinine Ratio: 11 — ABNORMAL LOW (ref 12–28)
BUN: 8 mg/dL (ref 8–27)
Bilirubin Total: 0.7 mg/dL (ref 0.0–1.2)
CO2: 23 mmol/L (ref 20–29)
Calcium: 9.5 mg/dL (ref 8.7–10.3)
Chloride: 104 mmol/L (ref 96–106)
Creatinine, Ser: 0.76 mg/dL (ref 0.57–1.00)
GFR calc Af Amer: 98 mL/min/{1.73_m2} (ref >59)
GFR calc non Af Amer: 85 mL/min/{1.73_m2} (ref >59)
Globulin, Total: 2.4 g/dL (ref 1.5–4.5)
Glucose: 100 mg/dL — ABNORMAL HIGH (ref 65–99)
Potassium: 4.1 mmol/L (ref 3.5–5.2)
Sodium: 141 mmol/L (ref 134–144)
Total Protein: 6.8 g/dL (ref 6.0–8.5)

## 2020-01-26 ENCOUNTER — Telehealth: Payer: Self-pay

## 2020-01-26 NOTE — Telephone Encounter (Signed)
Called and Spoke with pt per NP request regarding recent labs. Pt demonstrated understanding and had no questions nor concerns.  "Please call the patient. Blood work shows no significant abnormalities." -NP Sarah slack

## 2020-03-02 DIAGNOSIS — E039 Hypothyroidism, unspecified: Secondary | ICD-10-CM | POA: Diagnosis not present

## 2020-03-02 DIAGNOSIS — E785 Hyperlipidemia, unspecified: Secondary | ICD-10-CM | POA: Diagnosis not present

## 2020-03-02 DIAGNOSIS — E7800 Pure hypercholesterolemia, unspecified: Secondary | ICD-10-CM | POA: Diagnosis not present

## 2020-03-02 DIAGNOSIS — I10 Essential (primary) hypertension: Secondary | ICD-10-CM | POA: Diagnosis not present

## 2020-03-20 DIAGNOSIS — I10 Essential (primary) hypertension: Secondary | ICD-10-CM | POA: Diagnosis not present

## 2020-05-14 ENCOUNTER — Encounter (INDEPENDENT_AMBULATORY_CARE_PROVIDER_SITE_OTHER): Payer: Self-pay | Admitting: Ophthalmology

## 2020-05-14 ENCOUNTER — Other Ambulatory Visit: Payer: Self-pay

## 2020-05-14 ENCOUNTER — Ambulatory Visit (INDEPENDENT_AMBULATORY_CARE_PROVIDER_SITE_OTHER): Payer: PPO | Admitting: Ophthalmology

## 2020-05-14 DIAGNOSIS — H2513 Age-related nuclear cataract, bilateral: Secondary | ICD-10-CM

## 2020-05-14 DIAGNOSIS — H43813 Vitreous degeneration, bilateral: Secondary | ICD-10-CM | POA: Diagnosis not present

## 2020-05-14 DIAGNOSIS — H353212 Exudative age-related macular degeneration, right eye, with inactive choroidal neovascularization: Secondary | ICD-10-CM | POA: Insufficient documentation

## 2020-05-14 NOTE — Progress Notes (Signed)
05/14/2020     CHIEF COMPLAINT Patient presents for Retina Follow Up   HISTORY OF PRESENT ILLNESS: Susan Gomez is a 64 y.o. female who presents to the clinic today for:   HPI    Retina Follow Up    Patient presents with  Wet AMD.  In both eyes.  Duration of 6 months.  Since onset it is stable.          Comments    6 month follow up - OCT OU Patient denies change in vision and overall has no complaints.        Last edited by Berenice Bouton on 05/14/2020  1:04 PM. (History)      Referring physician: [name], MD [id number] [place],  [place] [address]  HISTORICAL INFORMATION:   Selected notes from the MEDICAL RECORD NUMBER       CURRENT MEDICATIONS: No current outpatient medications on file. (Ophthalmic Drugs)   No current facility-administered medications for this visit. (Ophthalmic Drugs)   Current Outpatient Medications (Other)  Medication Sig  . Dimethyl Fumarate (TECFIDERA) 240 MG CPDR Take 1 capsule (240 mg total) by mouth 2 (two) times daily.  Marland Kitchen gabapentin (NEURONTIN) 300 MG capsule Take 1 capsule (300 mg total) by mouth 2 (two) times daily as needed.  Marland Kitchen losartan-hydrochlorothiazide (HYZAAR) 100-12.5 MG per tablet Take 1 tablet by mouth daily.   Marland Kitchen SYNTHROID 112 MCG tablet Take 112 mcg by mouth every other day.    No current facility-administered medications for this visit. (Other)      REVIEW OF SYSTEMS:    ALLERGIES Allergies  Allergen Reactions  . Codeine Other (See Comments)    Recovering addict - wishes not to consume if possible    PAST MEDICAL HISTORY Past Medical History:  Diagnosis Date  . Encounter for long-term (current) use of other medications   . High blood pressure   . MS (multiple sclerosis) (HCC)   . Thyroid disease    Past Surgical History:  Procedure Laterality Date  . KNEE SURGERY    . LAPAROSCOPIC CHOLECYSTECTOMY SINGLE SITE WITH INTRAOPERATIVE CHOLANGIOGRAM N/A 12/02/2014   Procedure:  LAPAROSCOPIC CHOLECYSTECTOMY SINGLE SITE WITH INTRAOPERATIVE CHOLANGIOGRAM;  Surgeon: Karie Soda, MD;  Location: [facility];  Service: General;  Laterality: N/A;  . left thumb    . VAGINAL HYSTERECTOMY      FAMILY HISTORY Family History  Family history unknown: Yes    SOCIAL HISTORY Social History   Tobacco Use  . Smoking status: Never Smoker  . Smokeless tobacco: Never Used  Substance Use Topics  . Alcohol use: No    Alcohol/week: 0.0 standard drinks  . Drug use: No         OPHTHALMIC EXAM:  Base Eye Exam    Visual Acuity (Snellen - Linear)      Right Left   Dist [place] CF @ 7' 20/25-1   Dist ph [place] NI        Tonometry (Tonopen, 1:08 PM)      Right Left   Pressure 18 20       Pupils      Pupils Dark Light Shape React APD   Right PERRL 4 3 Round Slow None   Left PERRL 4 3 Round Slow None       Visual Fields (Counting fingers)      Left Right    Full Full       Extraocular Movement      Right Left  Full Full       Neuro/Psych    Oriented x3: Yes   Mood/Affect: Normal       Dilation    Both eyes: 1.0% Mydriacyl, 2.5% Phenylephrine @ 1:08 PM        Slit Lamp and Fundus Exam    External Exam      Right Left   External Normal Normal       Slit Lamp Exam      Right Left   Lids/Lashes Normal Normal   Conjunctiva/Sclera White and quiet White and quiet   Cornea Clear Clear   Anterior Chamber Deep and quiet Deep and quiet   Iris Round and reactive Round and reactive   Lens 2+ Nuclear sclerosis 2+ Nuclear sclerosis   Anterior Vitreous Normal Normal       Fundus Exam      Right Left   Posterior Vitreous Posterior vitreous detachment Posterior vitreous detachment   Disc Normal Normal   C/D Ratio 0.6 0.65   Macula Disciform scar, subfoveal Normal   Vessels Normal Normal   Periphery Normal Normal          IMAGING AND PROCEDURES  Imaging and Procedures for 05/14/20  OCT, Retina - OU - Both Eyes       Right Eye Quality was good. Scan  locations included subfoveal. Central Foveal Thickness: 588. Findings include abnormal foveal contour, no SRF, no IRF, subretinal scarring.   Left Eye Quality was good. Scan locations included subfoveal. Central Foveal Thickness: 243. Findings include normal foveal contour, no SRF, no IRF.   Notes Large subfoveal fibrotic scar, without intraretinal fluid nor subretinal fluid, stable over the last several years.  This conforms and corresponds to the white subfoveal fibrosis                  ASSESSMENT/PLAN:  Nuclear sclerotic cataract of both eyes The nature of cataract was discussed with the patient as well as the elective nature of surgery. The patient was reassured that surgery at a later date does not put the patient at risk for a worse outcome. It was emphasized that the need for surgery is dictated by the patient's quality of life as influenced by the cataract. Patient was instructed to maintain close follow up with their general eye care doctor.  Exudative age-related macular degeneration of right eye with inactive choroidal neovascularization (HCC) The nature of wet macular degeneration was discussed with the patient.  Forms of therapy reviewed include the use of Anti-VEGF medications injected painlessly into the eye, as well as other possible treatment modalities, including thermal laser therapy. Fellow eye involvement and risks were discussed with the patient. Upon the finding of wet age related macular degeneration, treatment will be offered. The treatment regimen is on a treat as needed basis with the intent to treat if necessary and extend interval of exams when possible. On average 1 out of 6 patients do not need lifetime therapy. However, the risk of recurrent disease is high for a lifetime.  Initially monthly, then periodic, examinations and evaluations will determine whether the next treatment is required on the day of the examination.    This condition is currently  involutional and stable now for some some time.  No therapy required for the right eye at this time  Posterior vitreous detachment of both eyes   The nature of posterior vitreous detachment was discussed with the patient as well as its physiology, its age prevalence, and its possible implication regarding  retinal breaks and detachment.  An informational brochure was given to the patient.  All the patient's questions were answered.  The patient was asked to return if new or different flashes or floaters develops.   Patient was instructed to contact office immediately if any changes were noticed. I explained to the patient that vitreous inside the eye is similar to jello inside a bowl. As the jello melts it can start to pull away from the bowl, similarly the vitreous throughout our lives can begin to pull away from the retina. That process is called a posterior vitreous detachment. In some cases, the vitreous can tug hard enough on the retina to form a retinal tear. I discussed with the patient the signs and symptoms of a retinal detachment.  Do not rub the eye.      ICD-10-CM   1. Exudative age-related macular degeneration of right eye with inactive choroidal neovascularization (HCC)  H35.3212 OCT, Retina - OU - Both Eyes  2. Posterior vitreous detachment of both eyes  H43.813 OCT, Retina - OU - Both Eyes  3. Nuclear sclerotic cataract of both eyes  H25.13     1.  I discussed with the patient the critical importance of protecting the remaining good eye from accidental injury.  This would include using hammer nail or outdoor high velocity lawn cleaning type materials such that no projectile could strike the left eye.  In other words use of protective glasses   2.  Patient is to promptly notify us or another eye care individual should she develop new onset distortions in the left eye. 3.  The cataract in each eye is currently visually not significant.  Ophthalmic Meds Ordered this visit:  No orders  of the defined types were placed in this encounter.      Return in about 9 months (around 02/12/2021) for DILATE OU, COLOR FP, OCT.  There are no Patient Instructions on file for this visit.   Explained the diagnoses, plan, and follow up with the patient and they expressed understanding.  Patient expressed understanding of the importance of proper follow up care.   Alford Highland Jazzmyne Rasnick M.D. Diseases & Surgery of the Retina and Vitreous [facility] 05/14/20     Abbreviations: M myopia (nearsighted); A astigmatism; H hyperopia (farsighted); P presbyopia; Mrx spectacle prescription;  CTL contact lenses; OD right eye; OS left eye; OU both eyes  XT exotropia; ET esotropia; PEK punctate epithelial keratitis; PEE punctate epithelial erosions; DES dry eye syndrome; MGD meibomian gland dysfunction; ATs artificial tears; PFAT's preservative free artificial tears; NSC nuclear sclerotic cataract; PSC posterior subcapsular cataract; ERM epi-retinal membrane; PVD posterior vitreous detachment; RD retinal detachment; DM diabetes mellitus; DR diabetic retinopathy; NPDR non-proliferative diabetic retinopathy; PDR proliferative diabetic retinopathy; CSME clinically significant macular edema; DME diabetic macular edema; dbh dot blot hemorrhages; CWS cotton wool spot; POAG primary open angle glaucoma; C/D cup-to-disc ratio; HVF humphrey visual field; GVF goldmann visual field; OCT optical coherence tomography; IOP intraocular pressure; BRVO Branch retinal vein occlusion; CRVO central retinal vein occlusion; CRAO central retinal artery occlusion; BRAO branch retinal artery occlusion; RT retinal tear; SB scleral buckle; PPV pars plana vitrectomy; VH Vitreous hemorrhage; PRP panretinal laser photocoagulation; IVK intravitreal kenalog; VMT vitreomacular traction; MH Macular hole;  NVD neovascularization of the disc; NVE neovascularization elsewhere; AREDS age related eye disease study; ARMD age related  macular degeneration; POAG primary open angle glaucoma; EBMD epithelial/anterior basement membrane dystrophy; ACIOL anterior chamber intraocular lens; IOL intraocular lens; PCIOL  posterior chamber intraocular lens; Phaco/IOL phacoemulsification with intraocular lens placement; PRK photorefractive keratectomy; LASIK laser assisted in situ keratomileusis; HTN hypertension; DM diabetes mellitus; COPD chronic obstructive pulmonary disease 

## 2020-05-14 NOTE — Assessment & Plan Note (Addendum)
The nature of wet macular degeneration was discussed with the patient.  Forms of therapy reviewed include the use of Anti-VEGF medications injected painlessly into the eye, as well as other possible treatment modalities, including thermal laser therapy. Fellow eye involvement and risks were discussed with the patient. Upon the finding of wet age related macular degeneration, treatment will be offered. The treatment regimen is on a treat as needed basis with the intent to treat if necessary and extend interval of exams when possible. On average 1 out of 6 patients do not need lifetime therapy. However, the risk of recurrent disease is high for a lifetime.  Initially monthly, then periodic, examinations and evaluations will determine whether the next treatment is required on the day of the examination.    This condition is currently involutional and stable now for some some time.  No therapy required for the right eye at this time

## 2020-05-14 NOTE — Assessment & Plan Note (Signed)

## 2020-05-14 NOTE — Assessment & Plan Note (Signed)

## 2020-05-31 DIAGNOSIS — E785 Hyperlipidemia, unspecified: Secondary | ICD-10-CM | POA: Diagnosis not present

## 2020-05-31 DIAGNOSIS — I10 Essential (primary) hypertension: Secondary | ICD-10-CM | POA: Diagnosis not present

## 2020-05-31 DIAGNOSIS — E039 Hypothyroidism, unspecified: Secondary | ICD-10-CM | POA: Diagnosis not present

## 2020-05-31 DIAGNOSIS — E7800 Pure hypercholesterolemia, unspecified: Secondary | ICD-10-CM | POA: Diagnosis not present

## 2020-06-12 DIAGNOSIS — E039 Hypothyroidism, unspecified: Secondary | ICD-10-CM | POA: Diagnosis not present

## 2020-06-12 DIAGNOSIS — I10 Essential (primary) hypertension: Secondary | ICD-10-CM | POA: Diagnosis not present

## 2020-06-12 DIAGNOSIS — E785 Hyperlipidemia, unspecified: Secondary | ICD-10-CM | POA: Diagnosis not present

## 2020-06-12 DIAGNOSIS — E7800 Pure hypercholesterolemia, unspecified: Secondary | ICD-10-CM | POA: Diagnosis not present

## 2020-07-03 DIAGNOSIS — I10 Essential (primary) hypertension: Secondary | ICD-10-CM | POA: Diagnosis not present

## 2020-07-10 DIAGNOSIS — G35 Multiple sclerosis: Secondary | ICD-10-CM | POA: Diagnosis not present

## 2020-07-10 DIAGNOSIS — Z1211 Encounter for screening for malignant neoplasm of colon: Secondary | ICD-10-CM | POA: Diagnosis not present

## 2020-07-10 DIAGNOSIS — E039 Hypothyroidism, unspecified: Secondary | ICD-10-CM | POA: Diagnosis not present

## 2020-07-10 DIAGNOSIS — I10 Essential (primary) hypertension: Secondary | ICD-10-CM | POA: Diagnosis not present

## 2020-07-10 DIAGNOSIS — E7800 Pure hypercholesterolemia, unspecified: Secondary | ICD-10-CM | POA: Diagnosis not present

## 2020-07-10 DIAGNOSIS — Z0000 Encounter for general adult medical examination without abnormal findings: Secondary | ICD-10-CM | POA: Diagnosis not present

## 2020-07-10 DIAGNOSIS — Z1231 Encounter for screening mammogram for malignant neoplasm of breast: Secondary | ICD-10-CM | POA: Diagnosis not present

## 2020-07-11 ENCOUNTER — Other Ambulatory Visit: Payer: Self-pay | Admitting: Family Medicine

## 2020-07-11 DIAGNOSIS — Z1231 Encounter for screening mammogram for malignant neoplasm of breast: Secondary | ICD-10-CM

## 2020-07-16 ENCOUNTER — Other Ambulatory Visit: Payer: Self-pay

## 2020-07-16 ENCOUNTER — Ambulatory Visit
Admission: RE | Admit: 2020-07-16 | Discharge: 2020-07-16 | Disposition: A | Discharge status: Home / Self Care | Payer: PPO | Source: Ambulatory Visit | Attending: Family Medicine | Admitting: Family Medicine

## 2020-07-16 DIAGNOSIS — Z1231 Encounter for screening mammogram for malignant neoplasm of breast: Secondary | ICD-10-CM

## 2020-07-26 ENCOUNTER — Telehealth: Payer: Self-pay | Admitting: Neurology

## 2020-07-26 ENCOUNTER — Other Ambulatory Visit: Payer: Self-pay

## 2020-07-26 ENCOUNTER — Encounter: Payer: Self-pay | Admitting: Neurology

## 2020-07-26 ENCOUNTER — Ambulatory Visit: Payer: PPO | Admitting: Neurology

## 2020-07-26 VITALS — BP 144/93 | HR 59 | Ht 63.0 in | Wt 197.6 lb

## 2020-07-26 DIAGNOSIS — G35 Multiple sclerosis: Secondary | ICD-10-CM | POA: Diagnosis not present

## 2020-07-26 DIAGNOSIS — G8929 Other chronic pain: Secondary | ICD-10-CM | POA: Diagnosis not present

## 2020-07-26 DIAGNOSIS — M5441 Lumbago with sciatica, right side: Secondary | ICD-10-CM

## 2020-07-26 MED ORDER — DIMETHYL FUMARATE 240 MG PO CPDR: 240.0000 mg | DELAYED_RELEASE_CAPSULE | Freq: Two times a day (BID) | ORAL | 3 refills | Status: DC

## 2020-07-26 NOTE — Progress Notes (Addendum)
PATIENT: Susan Gomez DOB: 22-Sep-1956  REASON FOR VISIT: follow up HISTORY FROM: patient  HISTORY OF PRESENT ILLNESS: Today 07/26/20  HISTORY  Susan Gomez is a 65 year-old black female, referred by her primary care physician Susan Gomez, to continue followup for her multiple sclerosis, and recent worsening of low back pain,   She is a prior patient of Susan Gomez since 2005. with diagnosis of relapsing remitting multiple sclerosis. The inital symptoms was facial asymetry, later also developed transient right leg weakness. The diagnosis is confirmed by abnormal MRI brain, cervical, and lumbar puncture showed more than 2 oligoclonal bands.   She has been on Betaseron every other day since 2005,. She denies any new onset symptoms or clear clinical flareups ever since, such as weakness, sensory changes, blurred vision or double vision, speech or swallowing problems, bowel or bladder difficulties, spasticity.   She complains of right side low back pain since 2000, right hip pain, shooting pain to right lateral thigh and leg, failed to improve by physical therapy, and stretching exercise. she denies incontinence  MRI brain in 2012 showed subtle confluent periventricular and subcortical white matter hyperintensitioes which are comaptible with demyelinating disease but appear unchanged from MRI 09/11/06. No enhancing lesions, T 1 black holes or significant atrophy is seen.  MRI scan of the lumbar spine shows only minor disc signal and facet degenerative changes without significant compression.  She has lost followup because of the financial burden, She has been out of Betaseron since 2012, there was no clear flareup in between,  She came back because of worsening bilateral lower extremity pain, shooting pain to both legs, continued low back pain, she has gait difficulty because of the pain, she denies bilateral feet or hands paresthesia, she denies bowel and bladder  incontinence,  She reported a history of right eye bleeding of unsure etiology in July 4th 2013, was under the care of opathamologist Susan Gomez, just received intra-epidural injection yesterday, with right conjunctiva small hematoma,  She also complains of 3 months history of left lip spasm, intermittent, intermitted right parietal area headaches,  She is a recovered addict of alcohol, cocaine, prescription medications.  UPDATE Jan 20th 2015:YY She complains of right jaw pain, shooting, sharp, intermittent, worse at night, bring tears to her eyes, hurts when she swallow, talking. Her low back pain has much improved. She was awake at night because of right jaw pain. She did not have MRI brain as planned. She has no gait difficulty, no incontinence.   UPDATE Feb 20th 2015:YY She presented to emergency room for right jaw pain, was getting prescription of Percocet in Nov 23 2013. Now her right jaw pain has much improved, she continued to have mild to moderate.lower back pain, she denies significant gait difficulty MRI of brain in January 2015, also compared with previous MRI in 2007, there is mild increase in supratentorium lesion load, but there was no enhancing lesions, JC virus ab was negative, with titer 0.22, January 20th 2015.  UPDATE June 3rd 2015:YY She started on Plegridy since March 2015,she had total of 4 injections, last injection was May 27th, before that was May 13rd, she was trained by Susan Gomez at home with her first injection. She has heart palpitation, flu-like illness, lasting one day, during 1st, 2nd injection, aleve was helping, during 3rd, 4th injections, she has more reactions, large size skin hives, sore, itching, involving her back, upper and lower extremity, dorsum hand. She has taken aleve, benadryl, Claritin,  and tried different creams, Benadryl, hydrocortisone creams, none of it stop her from itchings.  UPDATE Dec 7th 2015:YY Her low back pain  has much improved, only has mild midline low back pain, she denies shooting pain from her back to her lower extremity, she denies gait difficulty, she denies bowel and bladder incontinence, she is not on any immunomodulation therapy at this point   UPDATE Dec 7th 2016:YY she complains of feeling tired all the time, her husband went to hemodialysis, she denies significant gait difficulty, no visual loss,  I have reviewed her previous lumbar spine, mild degenerative changes no evidence of foraminal or canal stenosis  I personally reviewed and compared MRI brain w/wo in 2015 with 2007, overall only mild increase in supratentorium lesions, differentiation Diagnosis also including small vessel disease,  UPDATE Oct 14 2016:YY She has pain in her legs, sometimes difficulty walking. 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  UPDATE [id number]: She is tolerating Tecfidera since June 2018, doing well.  Her husband got kidney transplant, .  UPDATE January 04, 2018 (JV): Patient is seen today for a six-month follow-up. Overall she is doing well without any new complaints. Patient continues to take Tecfidera and Neurontin without side effects. No new complaints at today's follow-up visit.  UPDATE January 18 2019: I personally reviewed MRI of the brain with and without contrast September 2019, multiple T2/flair hyperintensity foci, no contrast-enhancement, no change compared to previous scan in December 2017,  Laboratory evaluation September 2019, normal CMP, CBC, with absolute neutrophil 0.8,  She is overall doing well,complains 2 days history of right-sided low back pain, radiating pain to right leg, no bowel and bladder incontinence,  Update July 27, 2019 SS:She reports she is doing well. She remains on Tecfidera. She denies any new numbness/weaknessin her arms or legs, or bowel or bladder incontinence. She reports that ifshe  gets stressed, she may have speech stuttering, word finding. Her granddaughter reports sometimes she will miss pronouncing words. She lives with her husband and family. She walks on a daily basis 30-60 mins.She has routine follow-up with her primary doctor. She is prescribed gabapentin from this office for chronic back pain and leg pain. She takes this on an as-needed basis. She does not need a refill. Last MRI of the brain in 2019 did not show any change from 2017. She is alone at today's appointment.  Update January 24, 2020 SS: Here today alone for follow-up.  She remains on Tecfidera.  She is doing home fitness exercise videos, notices when she stands on one leg, the right leg feels weaker.  No changes to gait, no falls.  No changes to bowels or bladder, revision.  She takes gabapentin as needed for chronic low back and leg pain on the right.  Her husband is set to have surgery soon.  She wishes to hold off on MRI of the brain, due to financial concerns.  Update July 26, 2020 SS: Remains on [place], MS seems overall stable, having more weakness to the right leg, when sitting for a while, and then standing, low back pain, radiating down the right leg, weakness to the leg.  No falls, no changes to B & B, lately has not been able to exercise because of the pain.  Takes gabapentin as needed.  Husband recovered from surgery, she does volunteer work for schools, she thinks something more than MS is going on with the back.  REVIEW OF SYSTEMS: Out of a complete 14 system review of symptoms, the patient complains only of the following symptoms, and all other reviewed systems are negative.  Back pain  ALLERGIES: Allergies  Allergen Reactions  . Codeine Other (See Comments)    Recovering addict - wishes not to consume if possible    HOME MEDICATIONS: Outpatient Medications Prior to Visit  Medication Sig Dispense Refill  . Dimethyl Fumarate (TECFIDERA) 240 MG CPDR Take 1 capsule (240 mg  total) by mouth 2 (two) times daily. 180 capsule 3  . gabapentin (NEURONTIN) 300 MG capsule Take 1 capsule (300 mg total) by mouth 2 (two) times daily as needed. 60 capsule 3  . losartan-hydrochlorothiazide (HYZAAR) 100-12.5 MG per tablet Take 1 tablet by mouth daily.     Marland Kitchen SYNTHROID 112 MCG tablet Take 112 mcg by mouth every other day.      No facility-administered medications prior to visit.    PAST MEDICAL HISTORY: Past Medical History:  Diagnosis Date  . Encounter for long-term (current) use of other medications   . High blood pressure   . MS (multiple sclerosis) (HCC)   . Thyroid disease     PAST SURGICAL HISTORY: Past Surgical History:  Procedure Laterality Date  . KNEE SURGERY    . LAPAROSCOPIC CHOLECYSTECTOMY SINGLE SITE WITH INTRAOPERATIVE CHOLANGIOGRAM N/A 12/02/2014   Procedure: LAPAROSCOPIC CHOLECYSTECTOMY SINGLE SITE WITH INTRAOPERATIVE CHOLANGIOGRAM;  Surgeon: Karie Soda, MD;  Location: [facility];  Service: General;  Laterality: N/A;  . left thumb    . VAGINAL HYSTERECTOMY      FAMILY HISTORY: Family History  Problem Relation Age of Onset  . Breast cancer Sister   . Breast cancer Sister     SOCIAL HISTORY: Social History   Socioeconomic History  . Marital status: Married    Spouse name: [name]  . Number of children: 3  . Years of education: 15+  . Highest education level: Not on file  Occupational History    Comment: Disabled  Tobacco Use  . Smoking status: Never Smoker  . Smokeless tobacco: Never Used  Substance and Sexual Activity  . Alcohol use: No    Alcohol/week: 0.0 standard drinks  . Drug use: No  . Sexual activity: Not on file  Other Topics Concern  . Not on file  Social History Narrative   Patient lives at home with her husband [name]).   Patient has 3 children and husbands has 4 children.   Patient has college education.   Patient is left-handed.   Patient drinks one cup of coffee in the am and two cups at night.      Social  Determinants of Health   Financial Resource Strain:   . Difficulty of Paying Living Expenses: Not on file  Food Insecurity:   . Worried About Programme researcher, broadcasting/film/video in the Last Year: Not on file  . Ran Out of Food in the Last Year: Not on file  Transportation Needs:   . Lack of Transportation (Medical): Not on file  . Lack of Transportation (Non-Medical): Not on file  Physical Activity:   . Days of Exercise per Week: Not on file  . Minutes of Exercise per Session: Not on file  Stress:   . Feeling of Stress : Not on file  Social Connections:   . Frequency of Communication with Friends and Family: Not on file  . Frequency of Social Gatherings with Friends and Family: Not on file  . Attends Religious Services: Not on file  .  Active Member of Clubs or Organizations: Not on file  . Attends Banker Meetings: Not on file  . Marital Status: Not on file  Intimate Partner Violence:   . Fear of Current or Ex-Partner: Not on file  . Emotionally Abused: Not on file  . Physically Abused: Not on file  . Sexually Abused: Not on file      PHYSICAL EXAM  Vitals:   07/26/20 0811  BP: (!) 144/93  Pulse: (!) 59  Weight: 197 lb 9.6 oz (89.6 kg)  Height: 5\' 3"  (1.6 m)   Body mass index is 35 kg/m.  Generalized: Well developed, in no acute distress   Neurological examination  Mentation: Alert oriented to time, place, history taking. Follows all commands speech and language fluent Cranial nerve II-XII: Pupils were equal round reactive to light. Extraocular movements were full, visual field were full on confrontational test. Facial sensation and strength were normal. Head turning and shoulder shrug  were normal and symmetric. Motor: The motor testing reveals 5 over 5 strength of all 4 extremities. Good symmetric motor tone is noted throughout.  Sensory: Sensory testing is intact to soft touch on all 4 extremities. No evidence of extinction is noted.  Coordination: Cerebellar testing  reveals good finger-nose-finger and heel-to-shin bilaterally.  Gait and station: Gait is normal, no limp noted. Reflexes: Deep tendon reflexes are symmetric and normal bilaterally.   DIAGNOSTIC DATA (LABS, IMAGING, TESTING) - I reviewed patient records, labs, notes, testing and imaging myself where available.  Lab Results  Component Value Date   WBC 3.9 01/24/2020   HGB 12.1 01/24/2020   HCT 35.5 01/24/2020   MCV 92 01/24/2020   PLT 242 01/24/2020      Component Value Date/Time   NA 141 01/24/2020 0928   K 4.1 01/24/2020 0928   CL 104 01/24/2020 0928   CO2 23 01/24/2020 0928   GLUCOSE 100 (H) 01/24/2020 0928   GLUCOSE 99 12/04/2014 0505   BUN 8 01/24/2020 0928   CREATININE 0.76 01/24/2020 0928   CALCIUM 9.5 01/24/2020 0928   PROT 6.8 01/24/2020 0928   ALBUMIN 4.4 01/24/2020 0928   AST 18 01/24/2020 0928   ALT 11 01/24/2020 0928   ALKPHOS 39 01/24/2020 0928   BILITOT 0.7 01/24/2020 0928   GFRNONAA 85 01/24/2020 0928   GFRAA 98 01/24/2020 0928   No results found for: CHOL, HDL, LDLCALC, LDLDIRECT, TRIG, CHOLHDL No results found for: 01/26/2020 Lab Results  Component Value Date   VITAMINB12 945 11/22/2013   Lab Results  Component Value Date   TSH 1.990 07/07/2017    ASSESSMENT AND PLAN 64 y.o. year old female  has a past medical history of Encounter for long-term (current) use of other medications, High blood pressure, MS (multiple sclerosis) (HCC), and Thyroid disease. here with:  1.  Relapsing remitting multiple sclerosis -Continue Tecfidera -Check MRI of the brain with and without contrast to ensure stability -Check CBC, CMP today to screen for adverse effect of Tecfidera  2.  Right-sided low back pain, leg pain -Continue gabapentin 300 mg twice a day as needed -Encouraged continued exercise, stretching -Will check MRI lumbar spine without contrast, pain/weakness to low back/right leg has increased, not able to exercise -Follow-up in 6 months or sooner if  needed   Orders Placed This Encounter  Procedures  . MR BRAIN W WO CONTRAST  . MR LUMBAR SPINE WO CONTRAST  . CBC with Differential/Platelet  . CMP    I spent 30 minutes of  face-to-face and non-face-to-face time with patient.  This included previsit chart review, lab review, study review, order entry, electronic health record documentation, patient education.  Margie Ege, AGNP-C, DNP 07/26/2020, 8:15 AM [facility] [address], [address] [place], [place] [address] [phone]

## 2020-07-26 NOTE — Patient Instructions (Signed)
Check blood work today  Order MRI of the brain and Lumbar Spine Continue current medications  See you back in 6 months

## 2020-07-26 NOTE — Telephone Encounter (Signed)
health team order sent to GI. No auth they will reach out to the patient to schedule.  

## 2020-07-27 LAB — COMPREHENSIVE METABOLIC PANEL
ALT: 8 IU/L (ref 0–32)
AST: 9 IU/L (ref 0–40)
Albumin/Globulin Ratio: 1.6 (ref 1.2–2.2)
Albumin: 4.3 g/dL (ref 3.8–4.8)
Alkaline Phosphatase: 44 IU/L (ref 44–121)
BUN/Creatinine Ratio: 11 — ABNORMAL LOW (ref 12–28)
BUN: 9 mg/dL (ref 8–27)
Bilirubin Total: 0.4 mg/dL (ref 0.0–1.2)
CO2: 23 mmol/L (ref 20–29)
Calcium: 9.6 mg/dL (ref 8.7–10.3)
Chloride: 100 mmol/L (ref 96–106)
Creatinine, Ser: 0.84 mg/dL (ref 0.57–1.00)
GFR calc Af Amer: 87 mL/min/{1.73_m2} (ref >59)
GFR calc non Af Amer: 75 mL/min/{1.73_m2} (ref >59)
Globulin, Total: 2.7 g/dL (ref 1.5–4.5)
Glucose: 103 mg/dL — ABNORMAL HIGH (ref 65–99)
Potassium: 3.9 mmol/L (ref 3.5–5.2)
Sodium: 138 mmol/L (ref 134–144)
Total Protein: 7 g/dL (ref 6.0–8.5)

## 2020-07-27 LAB — CBC WITH DIFFERENTIAL/PLATELET
Basophils Absolute: 0.1 10*3/uL (ref 0.0–0.2)
Basos: 1 %
EOS (ABSOLUTE): 0.1 10*3/uL (ref 0.0–0.4)
Eos: 2 %
Hematocrit: 36 % (ref 34.0–46.6)
Hemoglobin: 12.4 g/dL (ref 11.1–15.9)
Immature Grans (Abs): 0 10*3/uL (ref 0.0–0.1)
Immature Granulocytes: 0 %
Lymphocytes Absolute: 2.9 10*3/uL (ref 0.7–3.1)
Lymphs: 58 %
MCH: 31 pg (ref 26.6–33.0)
MCHC: 34.4 g/dL (ref 31.5–35.7)
MCV: 90 fL (ref 79–97)
Monocytes Absolute: 0.6 10*3/uL (ref 0.1–0.9)
Monocytes: 11 %
Neutrophils Absolute: 1.4 10*3/uL (ref 1.4–7.0)
Neutrophils: 28 %
Platelets: 256 10*3/uL (ref 150–450)
RBC: 4 x10E6/uL (ref 3.77–5.28)
RDW: 12.8 % (ref 11.7–15.4)
WBC: 5 10*3/uL (ref 3.4–10.8)

## 2020-07-31 ENCOUNTER — Telehealth: Payer: Self-pay | Admitting: Neurology

## 2020-07-31 DIAGNOSIS — E7800 Pure hypercholesterolemia, unspecified: Secondary | ICD-10-CM | POA: Diagnosis not present

## 2020-07-31 DIAGNOSIS — E039 Hypothyroidism, unspecified: Secondary | ICD-10-CM | POA: Diagnosis not present

## 2020-07-31 DIAGNOSIS — I10 Essential (primary) hypertension: Secondary | ICD-10-CM | POA: Diagnosis not present

## 2020-07-31 DIAGNOSIS — E785 Hyperlipidemia, unspecified: Secondary | ICD-10-CM | POA: Diagnosis not present

## 2020-07-31 NOTE — Telephone Encounter (Signed)
I had called patient to discuss her lab results. While on the phone, she had a question about her MS. She stated that she has noticed within the last month that she is not able to focus as she once could. She has noticed this with conversations with other people, reading forms or filling out forms and watching TV. She said it feels like brain fog but she denies any memory loss. She has never been like this before. Denied any personal history of ADD or ADHD.   She wants to know if this was possibly related to her MS.

## 2020-07-31 NOTE — Telephone Encounter (Signed)
It is possible symptoms are related to MS, we have ordered MRI to ensure stability and no new lesions. If she is having a lot of fatigue MS related and trouble focusing we might could consider Provigil in the future.

## 2020-07-31 NOTE — Telephone Encounter (Signed)
Spoke to pt, Relayed message from NP. Pt voiced understanding,  Will call if symptoms worsening

## 2020-08-21 ENCOUNTER — Ambulatory Visit
Admission: RE | Admit: 2020-08-21 | Discharge: 2020-08-21 | Disposition: A | Discharge status: Home / Self Care | Payer: PPO | Source: Ambulatory Visit | Attending: Neurology | Admitting: Neurology

## 2020-08-21 DIAGNOSIS — G8929 Other chronic pain: Secondary | ICD-10-CM | POA: Diagnosis not present

## 2020-08-21 DIAGNOSIS — G35 Multiple sclerosis: Secondary | ICD-10-CM

## 2020-08-21 DIAGNOSIS — M5441 Lumbago with sciatica, right side: Secondary | ICD-10-CM

## 2020-08-21 MED ORDER — GADOBENATE DIMEGLUMINE 529 MG/ML IV SOLN
18.0000 mL | Freq: Once | INTRAVENOUS | Status: AC | PRN
  Administered 2020-08-21: 18 mL via INTRAVENOUS

## 2020-08-23 ENCOUNTER — Telehealth: Payer: Self-pay | Admitting: Neurology

## 2020-08-23 NOTE — Telephone Encounter (Signed)
I called the patient and left a message.  MRI of the brain was overall stable, good news.  MRI of lumbar spine showed L4-5 disc bulging with moderate right foraminal stenosis, L3-4 bulging, mild right foraminal stenosis.  Likely, explains her right-sided leg pain, back pain.  Review of chart, indicates this has been a chronic problem. I was thinking if never tried injections, I could send to [facility] neurosurgery to see pain management doctor.  Could consider physical therapy, more gabapentin if she prefers.    IMPRESSION:   MRI brain (with and without) demonstrating: - Stable periventricular, subcortical foci of T2 hyperintensities. No abnormal lesions are seen on post contrast views.   - No acute findings.  IMPRESSION:   MRI lumbar spine (without) demonstrating: - Lumbarization of S1 with transitional features.  S1-2 intervertebral disc noted.   - At L4-5: disc bulging and facet hypertrophy with moderate right foraminal stenosis. - At L3-4: disc bulging and facet hypertrophy with mild right foraminal stenosis.

## 2020-08-27 DIAGNOSIS — Z1159 Encounter for screening for other viral diseases: Secondary | ICD-10-CM | POA: Diagnosis not present

## 2020-08-27 NOTE — Telephone Encounter (Addendum)
I called pt and relayed the MRI brain and Lumbar spine results.  She verbalized understanding but will have her hsuband call if needs more information.  I gave her the options for her relating to her pain.  She will discuss with husband then let us know.  Pt is attempting to sign up for mychart.  I have her # and sent her email.

## 2020-08-28 ENCOUNTER — Encounter: Payer: Self-pay | Admitting: *Deleted

## 2020-08-28 ENCOUNTER — Ambulatory Visit
Admission: EM | Admit: 2020-08-28 | Discharge: 2020-08-28 | Disposition: A | Discharge status: Home / Self Care | Payer: PPO

## 2020-08-28 ENCOUNTER — Other Ambulatory Visit: Payer: Self-pay

## 2020-08-28 ENCOUNTER — Ambulatory Visit (INDEPENDENT_AMBULATORY_CARE_PROVIDER_SITE_OTHER): Payer: PPO

## 2020-08-28 DIAGNOSIS — M79645 Pain in left finger(s): Secondary | ICD-10-CM

## 2020-08-28 DIAGNOSIS — E039 Hypothyroidism, unspecified: Secondary | ICD-10-CM | POA: Diagnosis not present

## 2020-08-28 DIAGNOSIS — M19042 Primary osteoarthritis, left hand: Secondary | ICD-10-CM | POA: Diagnosis not present

## 2020-08-28 DIAGNOSIS — S6992XA Unspecified injury of left wrist, hand and finger(s), initial encounter: Secondary | ICD-10-CM | POA: Diagnosis not present

## 2020-08-28 DIAGNOSIS — E7800 Pure hypercholesterolemia, unspecified: Secondary | ICD-10-CM | POA: Diagnosis not present

## 2020-08-28 DIAGNOSIS — E785 Hyperlipidemia, unspecified: Secondary | ICD-10-CM | POA: Diagnosis not present

## 2020-08-28 DIAGNOSIS — I10 Essential (primary) hypertension: Secondary | ICD-10-CM | POA: Diagnosis not present

## 2020-08-28 MED ORDER — CELECOXIB 50 MG PO CAPS: 50.0000 mg | ORAL_CAPSULE | Freq: Two times a day (BID) | ORAL | 0 refills | Status: AC

## 2020-08-28 NOTE — ED Triage Notes (Signed)
Patient in with complaints of middle finger pain on the left hand x 4 weeks after hitting her hand while playing with the dog. Patient does not know what she hit her hand on. Patient states that initially she experienced blood under the finger nail but she squeezed it out. No blood noted under finger nail at the time of assessment. Finger noted to be swollen. Patient able to bend finger.

## 2020-08-28 NOTE — ED Provider Notes (Signed)
Emergency Department Provider Note  ____________________________________________  Time seen: Approximately 9:57 AM  I have reviewed the triage vital signs and the nursing notes.   HISTORY  Chief Complaint Hand Pain   Historian Patient     HPI Susan Gomez is a 64 y.o. female presents to the urgent care with persistent left middle finger pain.  Patient states that she accidentally hit her left hand while playing with her dog and has noticed slight flexion lag at the DIP joint of the left middle finger.  She states that she has worsened pain with movement relieved with rest.  She states that she had finger splinted initially but has not been wearing splint of late.  She denies redness of the left middle finger and has not had any pain along the course of the flexor tendon.  No prior history of flexor tenosynovitis.  She denies fever and chills at home.  No splinters or known foreign bodies.  Patient had a subungual hematoma that has resolved without complication. Patient is left hand dominant.    Past Medical History:  Diagnosis Date  . Encounter for long-term (current) use of other medications   . High blood pressure   . MS (multiple sclerosis) (HCC)   . Thyroid disease      Immunizations up to date:  Yes.     Past Medical History:  Diagnosis Date  . Encounter for long-term (current) use of other medications   . High blood pressure   . MS (multiple sclerosis) (HCC)   . Thyroid disease     Patient Active Problem List   Diagnosis Date Noted  . Exudative age-related macular degeneration of right eye with inactive choroidal neovascularization (HCC) 05/14/2020  . Posterior vitreous detachment of both eyes 05/14/2020  . Nuclear sclerotic cataract of both eyes 05/14/2020  . Chronic right-sided low back pain with right-sided sciatica 01/18/2019  . Abnormality of gait 04/16/2017  . Therapeutic drug monitoring 04/16/2017  . Abnormal finding on MRI of brain 10/10/2015   . Low back pain 10/10/2015  . Acute cholecystitis 12/03/2014  . Elevated LFTs, probable CBD stone 12/02/2014  . Calculus of gallbladder with biliary obstruction but without cholecystitis   . Hypokalemia   . Essential hypertension 12/01/2014  . Hypothyroid 12/01/2014  . Cholelithiasis 12/01/2014  . Relapsing remitting multiple sclerosis (HCC) 09/23/2013  . HEMORRHOIDS-EXTERNAL 05/15/2009  . CONSTIPATION 05/15/2009  . ANAL OR RECTAL PAIN 05/15/2009  . ABDOMINAL PAIN-LLQ 05/15/2009  . PERSONAL HX COLONIC POLYPS 05/15/2009  . ANAL FISSURE 04/24/2009  . RECTAL BLEEDING 04/24/2009  . HEARTBURN 04/24/2009  . CHANGE IN BOWELS 04/24/2009    Past Surgical History:  Procedure Laterality Date  . KNEE SURGERY    . LAPAROSCOPIC CHOLECYSTECTOMY SINGLE SITE WITH INTRAOPERATIVE CHOLANGIOGRAM N/A 12/02/2014   Procedure: LAPAROSCOPIC CHOLECYSTECTOMY SINGLE SITE WITH INTRAOPERATIVE CHOLANGIOGRAM;  Surgeon: Karie Soda, MD;  Location: [facility];  Service: General;  Laterality: N/A;  . left thumb    . VAGINAL HYSTERECTOMY      Prior to Admission medications   Medication Sig Start Date End Date Taking? Authorizing Provider  azelastine (ASTELIN) 0.1 % nasal spray Place 1 spray into both nostrils 2 (two) times daily. 07/31/20  Yes [provider]  Dimethyl Fumarate (TECFIDERA) 240 MG CPDR Take 1 capsule (240 mg total) by mouth 2 (two) times daily. 07/26/20  Yes Glean Salvo, NP  gabapentin (NEURONTIN) 300 MG capsule Take 1 capsule (300 mg total) by mouth 2 (two) times daily as needed. 01/24/20  Yes Glean Salvo, NP  losartan-hydrochlorothiazide (HYZAAR) 100-12.5 MG per tablet Take 1 tablet by mouth daily.  10/17/13  Yes [provider]  SYNTHROID 112 MCG tablet Take 112 mcg by mouth every other day.  09/14/13  Yes [provider]  celecoxib (CELEBREX) 50 MG capsule Take 1 capsule (50 mg total) by mouth 2 (two) times daily for 7 days. 08/28/20 09/04/20  Orvil Feil, PA-C     Allergies Codeine  Family History  Problem Relation Age of Onset  . Breast cancer Sister   . Breast cancer Sister     Social History Social History   Tobacco Use  . Smoking status: Never Smoker  . Smokeless tobacco: Never Used  Vaping Use  . Vaping Use: Never used  Substance Use Topics  . Alcohol use: No    Alcohol/week: 0.0 standard drinks  . Drug use: No     Review of Systems  Constitutional: No fever/chills Eyes:  No discharge ENT: No upper respiratory complaints. Respiratory: no cough. No SOB/ use of accessory muscles to breath Gastrointestinal:   No nausea, no vomiting.  No diarrhea.  No constipation. Musculoskeletal: Patient has left middle finger pain.  Skin: Negative for rash, abrasions, lacerations, ecchymosis.    ____________________________________________   PHYSICAL EXAM:  VITAL SIGNS: ED Triage Vitals  Enc Vitals Group     BP 08/28/20 0946 (!) 158/90     Pulse Rate 08/28/20 0946 61     Resp 08/28/20 0946 20     Temp 08/28/20 0946 98.2 F (36.8 C)     Temp Source 08/28/20 0946 Oral     SpO2 08/28/20 0946 97 %     Weight --      Height --      Head Circumference --      Peak Flow --      Pain Score 08/28/20 0949 5     Pain Loc --      Pain Edu? --      Excl. in GC? --      Constitutional: Alert and oriented. Well appearing and in no acute distress. Eyes: Conjunctivae are normal. PERRL. EOMI. Head: Atraumatic. Cardiovascular: Normal rate, regular rhythm. Normal S1 and S2.  Good peripheral circulation. Respiratory: Normal respiratory effort without tachypnea or retractions. Lungs CTAB. Good air entry to the bases with no decreased or absent breath sounds Gastrointestinal: Bowel sounds x 4 quadrants. Soft and nontender to palpation. No guarding or rigidity. No distention. Musculoskeletal: Patient has 5 degrees of flexion lag at the left middle finger DIP joint and has some deficits in extension.  She can perform full range of motion at  the PIP joint.  No tenderness to palpation along the course of the left middle finger flexor tendon.  No erythema or palpable induration.  Palpable radial pulse, left.  Capillary refill less than 2 seconds on the left. Neurologic:  Normal for age. No gross focal neurologic deficits are appreciated.  Skin:  Skin is warm, dry and intact. No rash noted. Psychiatric: Mood and affect are normal for age. Speech and behavior are normal.   ____________________________________________   LABS (all labs ordered are listed, but only abnormal results are displayed)  Labs Reviewed - No data to display ____________________________________________  EKG   ____________________________________________  RADIOLOGY Geraldo Pitter, personally viewed and evaluated these images (plain radiographs) as part of my medical decision making, as well as reviewing the written report by the radiologist.    DG Finger Middle  Left  Result Date: 08/28/2020 CLINICAL DATA:  Injured playing with dog 4 weeks ago, persistent pain at DIP joint, impaired flexion EXAM: LEFT MIDDLE FINGER 2+V COMPARISON:  None FINDINGS: Osseous mineralization normal. Joint space narrowing and spur formation at DIP joint. Ununited ossicle is identified at the dorsal margin of the DIP joint likely representing a subacute to old fracture of a large dorsal spur arising from the base of the distal phalanx. No additional fracture, dislocation, or bone destruction. IMPRESSION: Advanced degenerative changes DIP joint LEFT middle finger. Non fused ossicle at dorsal margin of the DIP joint likely representing a subacute to old fracture of a dorsal spur arising from the base of the distal phalanx. Electronically Signed   By: Ulyses Southward M.D.   On: 08/28/2020 10:16    ____________________________________________    PROCEDURES  Procedure(s) performed:     Procedures     Medications - No data to  display   ____________________________________________   INITIAL IMPRESSION / ASSESSMENT AND PLAN / ED COURSE  Pertinent labs & imaging results that were available during my care of the patient were reviewed by me and considered in my medical decision making (see chart for details).      Assessment and Plan: Left middle finger pain:  64 year old female presents to the emergency department with left middle finger pain for the past 4 weeks after an accident while playing with her dog.  Patient was hypertensive at triage but vital signs were otherwise reassuring.  Patient had slight flexion lag noted on physical exam.  Patient was placed in a finger splint and advised to follow-up with orthopedics.  She was discharged with Cox 2 selective NSAID, Celebrex.  Return precautions were given to return with new or worsening symptoms.  All patient questions were answered.   ____________________________________________  FINAL CLINICAL IMPRESSION(S) / ED DIAGNOSES  Final diagnoses:  Pain of left middle finger      NEW MEDICATIONS STARTED DURING THIS VISIT:  ED Discharge Orders         Ordered    celecoxib (CELEBREX) 50 MG capsule  2 times daily        08/28/20 1029              This chart was dictated using voice recognition software/Dragon. Despite best efforts to proofread, errors can occur which can change the meaning. Any change was purely unintentional.     Orvil Feil, PA-C 08/28/20 1033

## 2020-08-28 NOTE — Discharge Instructions (Addendum)
Please make follow up appointment with Dr. Amanda Pea. You can take Celebrex for pain.

## 2020-08-29 DIAGNOSIS — I10 Essential (primary) hypertension: Secondary | ICD-10-CM | POA: Diagnosis not present

## 2020-08-30 DIAGNOSIS — Z1211 Encounter for screening for malignant neoplasm of colon: Secondary | ICD-10-CM | POA: Diagnosis not present

## 2020-08-30 DIAGNOSIS — I10 Essential (primary) hypertension: Secondary | ICD-10-CM | POA: Diagnosis not present

## 2020-08-30 DIAGNOSIS — K635 Polyp of colon: Secondary | ICD-10-CM | POA: Diagnosis not present

## 2020-08-30 DIAGNOSIS — K621 Rectal polyp: Secondary | ICD-10-CM | POA: Diagnosis not present

## 2020-09-04 DIAGNOSIS — K635 Polyp of colon: Secondary | ICD-10-CM | POA: Diagnosis not present

## 2020-09-04 DIAGNOSIS — K621 Rectal polyp: Secondary | ICD-10-CM | POA: Diagnosis not present

## 2020-09-06 ENCOUNTER — Emergency Department (HOSPITAL_COMMUNITY)
Admission: EM | Admit: 2020-09-06 | Discharge: 2020-09-06 | Disposition: A | Discharge status: Home / Self Care | Payer: PPO | Attending: Emergency Medicine | Admitting: Emergency Medicine

## 2020-09-06 ENCOUNTER — Other Ambulatory Visit: Payer: Self-pay

## 2020-09-06 ENCOUNTER — Encounter (HOSPITAL_COMMUNITY): Payer: Self-pay

## 2020-09-06 ENCOUNTER — Emergency Department (HOSPITAL_COMMUNITY): Payer: PPO

## 2020-09-06 DIAGNOSIS — S161XXA Strain of muscle, fascia and tendon at neck level, initial encounter: Secondary | ICD-10-CM | POA: Diagnosis not present

## 2020-09-06 DIAGNOSIS — M542 Cervicalgia: Secondary | ICD-10-CM | POA: Diagnosis not present

## 2020-09-06 DIAGNOSIS — S0083XA Contusion of other part of head, initial encounter: Secondary | ICD-10-CM | POA: Insufficient documentation

## 2020-09-06 DIAGNOSIS — Z79899 Other long term (current) drug therapy: Secondary | ICD-10-CM | POA: Diagnosis not present

## 2020-09-06 DIAGNOSIS — E039 Hypothyroidism, unspecified: Secondary | ICD-10-CM | POA: Insufficient documentation

## 2020-09-06 DIAGNOSIS — I10 Essential (primary) hypertension: Secondary | ICD-10-CM | POA: Diagnosis not present

## 2020-09-06 DIAGNOSIS — S0990XA Unspecified injury of head, initial encounter: Secondary | ICD-10-CM | POA: Diagnosis not present

## 2020-09-06 DIAGNOSIS — S199XXA Unspecified injury of neck, initial encounter: Secondary | ICD-10-CM | POA: Diagnosis not present

## 2020-09-06 DIAGNOSIS — R52 Pain, unspecified: Secondary | ICD-10-CM | POA: Diagnosis not present

## 2020-09-06 DIAGNOSIS — S0993XA Unspecified injury of face, initial encounter: Secondary | ICD-10-CM | POA: Diagnosis not present

## 2020-09-06 MED ORDER — METHOCARBAMOL 500 MG PO TABS: 500.0000 mg | ORAL_TABLET | Freq: Two times a day (BID) | ORAL | 0 refills | Status: DC
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
## 2020-09-06 NOTE — Discharge Instructions (Signed)
Your blood pressure was elevated here.  Follow-up with your doctor to make sure it comes down.
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
## 2020-09-13 DIAGNOSIS — F419 Anxiety disorder, unspecified: Secondary | ICD-10-CM | POA: Diagnosis not present

## 2020-09-13 DIAGNOSIS — S060X0S Concussion without loss of consciousness, sequela: Secondary | ICD-10-CM | POA: Diagnosis not present

## 2020-09-18 DIAGNOSIS — M545 Low back pain, unspecified: Secondary | ICD-10-CM | POA: Diagnosis not present

## 2020-09-18 DIAGNOSIS — F419 Anxiety disorder, unspecified: Secondary | ICD-10-CM | POA: Diagnosis not present

## 2020-09-18 DIAGNOSIS — S060X0S Concussion without loss of consciousness, sequela: Secondary | ICD-10-CM | POA: Diagnosis not present

## 2020-09-20 DIAGNOSIS — I10 Essential (primary) hypertension: Secondary | ICD-10-CM | POA: Diagnosis not present

## 2020-09-20 DIAGNOSIS — M79645 Pain in left finger(s): Secondary | ICD-10-CM | POA: Diagnosis not present

## 2020-09-20 DIAGNOSIS — M13849 Other specified arthritis, unspecified hand: Secondary | ICD-10-CM | POA: Diagnosis not present

## 2020-09-24 NOTE — Progress Notes (Signed)
I have reviewed and agreed above plan. 

## 2020-09-25 DIAGNOSIS — E785 Hyperlipidemia, unspecified: Secondary | ICD-10-CM | POA: Diagnosis not present

## 2020-09-25 DIAGNOSIS — E7800 Pure hypercholesterolemia, unspecified: Secondary | ICD-10-CM | POA: Diagnosis not present

## 2020-09-25 DIAGNOSIS — E039 Hypothyroidism, unspecified: Secondary | ICD-10-CM | POA: Diagnosis not present

## 2020-09-25 DIAGNOSIS — I10 Essential (primary) hypertension: Secondary | ICD-10-CM | POA: Diagnosis not present

## 2020-10-09 DIAGNOSIS — R519 Headache, unspecified: Secondary | ICD-10-CM | POA: Diagnosis not present

## 2020-10-09 DIAGNOSIS — M542 Cervicalgia: Secondary | ICD-10-CM | POA: Diagnosis not present

## 2020-10-09 DIAGNOSIS — S060X0D Concussion without loss of consciousness, subsequent encounter: Secondary | ICD-10-CM | POA: Diagnosis not present

## 2020-10-16 DIAGNOSIS — S060X0D Concussion without loss of consciousness, subsequent encounter: Secondary | ICD-10-CM | POA: Diagnosis not present

## 2020-10-16 DIAGNOSIS — R519 Headache, unspecified: Secondary | ICD-10-CM | POA: Diagnosis not present

## 2020-10-16 DIAGNOSIS — M542 Cervicalgia: Secondary | ICD-10-CM | POA: Diagnosis not present

## 2020-10-18 DIAGNOSIS — R519 Headache, unspecified: Secondary | ICD-10-CM | POA: Diagnosis not present

## 2020-10-18 DIAGNOSIS — M542 Cervicalgia: Secondary | ICD-10-CM | POA: Diagnosis not present

## 2020-10-18 DIAGNOSIS — S060X0D Concussion without loss of consciousness, subsequent encounter: Secondary | ICD-10-CM | POA: Diagnosis not present

## 2020-10-23 DIAGNOSIS — S060X0D Concussion without loss of consciousness, subsequent encounter: Secondary | ICD-10-CM | POA: Diagnosis not present

## 2020-10-23 DIAGNOSIS — M542 Cervicalgia: Secondary | ICD-10-CM | POA: Diagnosis not present

## 2020-10-23 DIAGNOSIS — R519 Headache, unspecified: Secondary | ICD-10-CM | POA: Diagnosis not present

## 2020-11-06 DIAGNOSIS — S060X0D Concussion without loss of consciousness, subsequent encounter: Secondary | ICD-10-CM | POA: Diagnosis not present

## 2020-11-06 DIAGNOSIS — R519 Headache, unspecified: Secondary | ICD-10-CM | POA: Diagnosis not present

## 2020-11-06 DIAGNOSIS — M542 Cervicalgia: Secondary | ICD-10-CM | POA: Diagnosis not present

## 2020-11-08 DIAGNOSIS — M542 Cervicalgia: Secondary | ICD-10-CM | POA: Diagnosis not present

## 2020-11-08 DIAGNOSIS — S060X0D Concussion without loss of consciousness, subsequent encounter: Secondary | ICD-10-CM | POA: Diagnosis not present

## 2020-11-08 DIAGNOSIS — R519 Headache, unspecified: Secondary | ICD-10-CM | POA: Diagnosis not present

## 2020-11-15 DIAGNOSIS — R519 Headache, unspecified: Secondary | ICD-10-CM | POA: Diagnosis not present

## 2020-11-15 DIAGNOSIS — M542 Cervicalgia: Secondary | ICD-10-CM | POA: Diagnosis not present

## 2020-11-15 DIAGNOSIS — S060X0D Concussion without loss of consciousness, subsequent encounter: Secondary | ICD-10-CM | POA: Diagnosis not present

## 2020-11-23 DIAGNOSIS — J0110 Acute frontal sinusitis, unspecified: Secondary | ICD-10-CM | POA: Diagnosis not present

## 2020-11-27 DIAGNOSIS — R519 Headache, unspecified: Secondary | ICD-10-CM | POA: Diagnosis not present

## 2020-11-27 DIAGNOSIS — M542 Cervicalgia: Secondary | ICD-10-CM | POA: Diagnosis not present

## 2020-11-27 DIAGNOSIS — S060X0D Concussion without loss of consciousness, subsequent encounter: Secondary | ICD-10-CM | POA: Diagnosis not present

## 2020-12-04 DIAGNOSIS — S060X0D Concussion without loss of consciousness, subsequent encounter: Secondary | ICD-10-CM | POA: Diagnosis not present

## 2020-12-04 DIAGNOSIS — M542 Cervicalgia: Secondary | ICD-10-CM | POA: Diagnosis not present

## 2020-12-04 DIAGNOSIS — R519 Headache, unspecified: Secondary | ICD-10-CM | POA: Diagnosis not present

## 2020-12-06 DIAGNOSIS — R519 Headache, unspecified: Secondary | ICD-10-CM | POA: Diagnosis not present

## 2020-12-06 DIAGNOSIS — M542 Cervicalgia: Secondary | ICD-10-CM | POA: Diagnosis not present

## 2020-12-06 DIAGNOSIS — S060X0D Concussion without loss of consciousness, subsequent encounter: Secondary | ICD-10-CM | POA: Diagnosis not present

## 2020-12-11 DIAGNOSIS — M542 Cervicalgia: Secondary | ICD-10-CM | POA: Diagnosis not present

## 2020-12-11 DIAGNOSIS — R519 Headache, unspecified: Secondary | ICD-10-CM | POA: Diagnosis not present

## 2020-12-11 DIAGNOSIS — S060X0D Concussion without loss of consciousness, subsequent encounter: Secondary | ICD-10-CM | POA: Diagnosis not present

## 2020-12-13 DIAGNOSIS — S060X0D Concussion without loss of consciousness, subsequent encounter: Secondary | ICD-10-CM | POA: Diagnosis not present

## 2020-12-13 DIAGNOSIS — M542 Cervicalgia: Secondary | ICD-10-CM | POA: Diagnosis not present

## 2020-12-13 DIAGNOSIS — R519 Headache, unspecified: Secondary | ICD-10-CM | POA: Diagnosis not present

## 2020-12-18 DIAGNOSIS — R519 Headache, unspecified: Secondary | ICD-10-CM | POA: Diagnosis not present

## 2020-12-18 DIAGNOSIS — S060X0D Concussion without loss of consciousness, subsequent encounter: Secondary | ICD-10-CM | POA: Diagnosis not present

## 2020-12-18 DIAGNOSIS — M542 Cervicalgia: Secondary | ICD-10-CM | POA: Diagnosis not present

## 2020-12-20 DIAGNOSIS — M542 Cervicalgia: Secondary | ICD-10-CM | POA: Diagnosis not present

## 2020-12-20 DIAGNOSIS — R519 Headache, unspecified: Secondary | ICD-10-CM | POA: Diagnosis not present

## 2020-12-20 DIAGNOSIS — S060X0D Concussion without loss of consciousness, subsequent encounter: Secondary | ICD-10-CM | POA: Diagnosis not present

## 2020-12-25 DIAGNOSIS — R519 Headache, unspecified: Secondary | ICD-10-CM | POA: Diagnosis not present

## 2020-12-25 DIAGNOSIS — M542 Cervicalgia: Secondary | ICD-10-CM | POA: Diagnosis not present

## 2020-12-25 DIAGNOSIS — S060X0D Concussion without loss of consciousness, subsequent encounter: Secondary | ICD-10-CM | POA: Diagnosis not present

## 2021-01-01 DIAGNOSIS — S060X0D Concussion without loss of consciousness, subsequent encounter: Secondary | ICD-10-CM | POA: Diagnosis not present

## 2021-01-01 DIAGNOSIS — R519 Headache, unspecified: Secondary | ICD-10-CM | POA: Diagnosis not present

## 2021-01-01 DIAGNOSIS — M542 Cervicalgia: Secondary | ICD-10-CM | POA: Diagnosis not present

## 2021-01-03 DIAGNOSIS — R519 Headache, unspecified: Secondary | ICD-10-CM | POA: Diagnosis not present

## 2021-01-03 DIAGNOSIS — M542 Cervicalgia: Secondary | ICD-10-CM | POA: Diagnosis not present

## 2021-01-03 DIAGNOSIS — S060X0D Concussion without loss of consciousness, subsequent encounter: Secondary | ICD-10-CM | POA: Diagnosis not present

## 2021-01-08 DIAGNOSIS — Z20822 Contact with and (suspected) exposure to covid-19: Secondary | ICD-10-CM | POA: Diagnosis not present

## 2021-01-10 DIAGNOSIS — M542 Cervicalgia: Secondary | ICD-10-CM | POA: Diagnosis not present

## 2021-01-10 DIAGNOSIS — S060X0D Concussion without loss of consciousness, subsequent encounter: Secondary | ICD-10-CM | POA: Diagnosis not present

## 2021-01-10 DIAGNOSIS — R519 Headache, unspecified: Secondary | ICD-10-CM | POA: Diagnosis not present

## 2021-01-22 DIAGNOSIS — S060X0D Concussion without loss of consciousness, subsequent encounter: Secondary | ICD-10-CM | POA: Diagnosis not present

## 2021-01-22 DIAGNOSIS — R519 Headache, unspecified: Secondary | ICD-10-CM | POA: Diagnosis not present

## 2021-01-22 DIAGNOSIS — M542 Cervicalgia: Secondary | ICD-10-CM | POA: Diagnosis not present
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
## 2021-01-24 ENCOUNTER — Encounter: Payer: Self-pay | Admitting: Neurology

## 2021-01-24 ENCOUNTER — Telehealth: Payer: Self-pay | Admitting: Neurology

## 2021-01-24 ENCOUNTER — Ambulatory Visit: Payer: PPO | Admitting: Neurology

## 2021-01-24 VITALS — BP 142/100 | HR 57 | Ht 63.0 in | Wt 201.0 lb

## 2021-01-24 DIAGNOSIS — G8929 Other chronic pain: Secondary | ICD-10-CM

## 2021-01-24 DIAGNOSIS — S060X0D Concussion without loss of consciousness, subsequent encounter: Secondary | ICD-10-CM | POA: Diagnosis not present

## 2021-01-24 DIAGNOSIS — G35 Multiple sclerosis: Secondary | ICD-10-CM | POA: Diagnosis not present

## 2021-01-24 DIAGNOSIS — M5441 Lumbago with sciatica, right side: Secondary | ICD-10-CM

## 2021-01-24 DIAGNOSIS — R519 Headache, unspecified: Secondary | ICD-10-CM | POA: Diagnosis not present

## 2021-01-24 DIAGNOSIS — M542 Cervicalgia: Secondary | ICD-10-CM | POA: Diagnosis not present

## 2021-01-24 MED ORDER — GABAPENTIN 300 MG PO CAPS: 300.0000 mg | ORAL_CAPSULE | Freq: Two times a day (BID) | ORAL | 3 refills | Status: DC | PRN

## 2021-01-24 NOTE — Telephone Encounter (Signed)
Can we check with [facility], to see what the status is of her Tecfidera.  Has been told unsure if funding is there, has only been taking Tecfidera once daily due to limited supply.  Okay to switch to generic dimethyl fumarate, or can consider Vumerity.

## 2021-01-24 NOTE — Telephone Encounter (Signed)
I called Susan Gomez with [facility].  Stating pt is no longer with copay assistance.  Had temporary funding for 30 days 11-12-20 thru 12-12-20 until application completed needed by 30 days.   TAF funding approved for 30 days but application will need to be completed even if information given on phone being completed by foundation personnel.  She can be placed on waitlist for foundation to open up.  I spoke to shakira with [facility] [phone].  Then also Tresa Endo with [employer] (pt advocate).  So she may need new medication, as have no idea when foundation funds may be available.  Spoke to pt and let her know.  She said she completed p/w then f/u online her self (them helping her).  She will call them and then let me know how many capsules she has.

## 2021-01-24 NOTE — Patient Instructions (Signed)
Will check into the Tecfidera issue Check labs today  Continue gabapentin as needed See you back in 6 months

## 2021-01-25 LAB — COMPREHENSIVE METABOLIC PANEL
ALT: 16 IU/L (ref 0–32)
AST: 19 IU/L (ref 0–40)
Albumin/Globulin Ratio: 1.6 (ref 1.2–2.2)
Albumin: 4.2 g/dL (ref 3.8–4.8)
Alkaline Phosphatase: 40 IU/L — ABNORMAL LOW (ref 44–121)
BUN/Creatinine Ratio: 14 (ref 12–28)
BUN: 11 mg/dL (ref 8–27)
Bilirubin Total: 0.5 mg/dL (ref 0.0–1.2)
CO2: 25 mmol/L (ref 20–29)
Calcium: 9.5 mg/dL (ref 8.7–10.3)
Chloride: 99 mmol/L (ref 96–106)
Creatinine, Ser: 0.76 mg/dL (ref 0.57–1.00)
Globulin, Total: 2.7 g/dL (ref 1.5–4.5)
Glucose: 100 mg/dL — ABNORMAL HIGH (ref 65–99)
Potassium: 3.7 mmol/L (ref 3.5–5.2)
Sodium: 137 mmol/L (ref 134–144)
Total Protein: 6.9 g/dL (ref 6.0–8.5)
eGFR: 89 mL/min/{1.73_m2} (ref >59)

## 2021-01-25 LAB — CBC WITH DIFFERENTIAL/PLATELET
Basophils Absolute: 0 10*3/uL (ref 0.0–0.2)
Basos: 1 %
EOS (ABSOLUTE): 0 10*3/uL (ref 0.0–0.4)
Eos: 1 %
Hematocrit: 35.2 % (ref 34.0–46.6)
Hemoglobin: 11.8 g/dL (ref 11.1–15.9)
Immature Grans (Abs): 0 10*3/uL (ref 0.0–0.1)
Immature Granulocytes: 0 %
Lymphocytes Absolute: 2.5 10*3/uL (ref 0.7–3.1)
Lymphs: 58 %
MCH: 30.3 pg (ref 26.6–33.0)
MCHC: 33.5 g/dL (ref 31.5–35.7)
MCV: 90 fL (ref 79–97)
Monocytes Absolute: 0.4 10*3/uL (ref 0.1–0.9)
Monocytes: 10 %
Neutrophils Absolute: 1.3 10*3/uL — ABNORMAL LOW (ref 1.4–7.0)
Neutrophils: 30 %
Platelets: 258 10*3/uL (ref 150–450)
RBC: 3.9 x10E6/uL (ref 3.77–5.28)
RDW: 12.7 % (ref 11.7–15.4)
WBC: 4.3 10*3/uL (ref 3.4–10.8)

## 2021-01-28 ENCOUNTER — Telehealth: Payer: Self-pay

## 2021-01-28 MED ORDER — DIMETHYL FUMARATE 240 MG PO CPDR: 240.0000 mg | DELAYED_RELEASE_CAPSULE | Freq: Two times a day (BID) | ORAL | 5 refills | Status: DC

## 2021-01-28 NOTE — Addendum Note (Signed)
Addended by: Geronimo Running A on: 01/28/2021 01:09 PM   Modules accepted: Orders

## 2021-01-28 NOTE — Telephone Encounter (Signed)
PA for dimethyl fumarate has been approved by [employer].  Dates of service are March 28th 2022 through January 28, 2022.  I have informed Nutritional therapist.

## 2021-01-28 NOTE — Telephone Encounter (Signed)
[facility] informed me that a PA is needed for dimethyl fumarate.  Completed via CMM.  Sent to [employer].  Should have a determination within 1-3 business days.  Marked as urgent. Key: [id number]

## 2021-01-28 NOTE — Telephone Encounter (Signed)
Attempted to call pt, LVM for results per DPR. °Ask pt to call back for questions or concerns.  °

## 2021-01-28 NOTE — Telephone Encounter (Signed)
I called pt she relayed she sent in p/w 12-25-20 told no funding available at this time.  She has 20 caps left. (taking 1 cap bid ) (10 days therapy left).  If not able to get bridge from [employer] she was ok to go with whatever SSL/NP recommended. Speaking to [place] with [employer] if they have bridge program.  She stated that no she was not eligible.

## 2021-01-28 NOTE — Telephone Encounter (Signed)
-----   Message from Glean Salvo, NP sent at 01/28/2021  7:32 AM EDT ----- Please call the patient, blood work shows no significant abnormalities.  Please have her keep [place] updated as she learns of information about funding for Tecfidera.

## 2021-01-28 NOTE — Telephone Encounter (Signed)
It is unlikely that we will find any co-pay assistance for Tecfidera.  We can try to find co-pay assistance for dimethyl fumarate through [employer].  I will send the maintenance dose of dimethyl fumarate to [facility] and have them investigate this further.  If dimethyl fumarate is still too expensive for patient, Vumerity could be considered because they have a free drug program.  I called patient.  I discussed this with her.  She is agreeable to sending dimethyl fumarate to [facility] to see if that is more affordable.  I will keep her updated.

## 2021-01-28 NOTE — Telephone Encounter (Signed)
If there is no funding available for Tecfidera and she cannot afford generic Tecfidera, we can switch her to Vumerity, will route to Dr. Terrace Arabia so she knows we are considering a medication switch.

## 2021-01-29 DIAGNOSIS — R519 Headache, unspecified: Secondary | ICD-10-CM | POA: Diagnosis not present

## 2021-01-29 DIAGNOSIS — M542 Cervicalgia: Secondary | ICD-10-CM | POA: Diagnosis not present

## 2021-01-29 DIAGNOSIS — S060X0D Concussion without loss of consciousness, subsequent encounter: Secondary | ICD-10-CM | POA: Diagnosis not present

## 2021-01-30 ENCOUNTER — Telehealth: Payer: Self-pay | Admitting: Neurology

## 2021-01-30 NOTE — Telephone Encounter (Signed)
[facility]) called, trying to verify if patient took Dimethyl Fumarate 120 mg for 7 days before starting Dimethyl Fumarate 240 mg. Would like a call from the nurse.

## 2021-01-30 NOTE — Telephone Encounter (Signed)
Called pt and she has not taken dimethyl fumarate at all.  Will she need to take lower dose if she has been on tecfidera??  If gap wouls she need to start over??

## 2021-01-30 NOTE — Telephone Encounter (Signed)
She has only been taking once daily 240 mg, I think she would be fine to go to full dose 240 mg twice daily, but can be patient preference. I just want her on the full dose medication consistently.

## 2021-01-30 NOTE — Telephone Encounter (Signed)
I called [facility] they wanted clarification about what dose for pt. Pt has been on 240mg  po bid tecfidera, ok to proceed with same dosing per SS/NP if ok with pt, (which pt is in agreement).  [facility] to call pt today.  I relayed this to pt.

## 2021-02-04 NOTE — Addendum Note (Signed)
Addended by: Geronimo Running A on: 02/04/2021 02:04 PM   Modules accepted: Orders

## 2021-02-04 NOTE — Telephone Encounter (Signed)
I called patient.  She reports that she received her first bottle of dimethyl fumarate on Friday.  She is finishing up her current supply of Tecfidera and will start the dimethyl fumarate.  She is working with [employer] to enroll in a co-pay assistance program.  She will let me know if that does not work out.

## 2021-02-04 NOTE — Telephone Encounter (Signed)
Received notification from [facility] that patient's dimethyl fumarate will have an estimated total co-pay of $290.44.  One of their patient care coordinators will be contacting the patient to explain benefit details and arrange medication delivery.  They can be reached directly at [phone].

## 2021-02-05 DIAGNOSIS — S060X0D Concussion without loss of consciousness, subsequent encounter: Secondary | ICD-10-CM | POA: Diagnosis not present

## 2021-02-05 DIAGNOSIS — M542 Cervicalgia: Secondary | ICD-10-CM | POA: Diagnosis not present

## 2021-02-05 DIAGNOSIS — R519 Headache, unspecified: Secondary | ICD-10-CM | POA: Diagnosis not present

## 2021-02-12 ENCOUNTER — Other Ambulatory Visit: Payer: Self-pay

## 2021-02-12 ENCOUNTER — Encounter (INDEPENDENT_AMBULATORY_CARE_PROVIDER_SITE_OTHER): Payer: Self-pay | Admitting: Ophthalmology

## 2021-02-12 ENCOUNTER — Ambulatory Visit (INDEPENDENT_AMBULATORY_CARE_PROVIDER_SITE_OTHER): Payer: PPO | Admitting: Ophthalmology

## 2021-02-12 DIAGNOSIS — H353212 Exudative age-related macular degeneration, right eye, with inactive choroidal neovascularization: Secondary | ICD-10-CM

## 2021-02-12 DIAGNOSIS — H2513 Age-related nuclear cataract, bilateral: Secondary | ICD-10-CM

## 2021-02-12 NOTE — Progress Notes (Signed)
02/12/2021     CHIEF COMPLAINT Patient presents for Retina Follow Up (8month fu OD//Pt states [facility] OU stable since last visit. Pt denies FOL, floaters, or ocular pain OU. //)   HISTORY OF PRESENT ILLNESS: Susan Gomez is a 65 y.o. female who presents to the clinic today for:   HPI    Retina Follow Up    Patient presents with  Wet AMD.  In right eye.  This started 9 months ago.  Severity is mild.  Duration of 9 months.  Since onset it is stable. Additional comments: 41month fu OD  Pt states VA OU stable since last visit. Pt denies FOL, floaters, or ocular pain OU.          Last edited by Demetrios Loll, COA on 02/12/2021  1:20 PM. (History)      Referring physician: [name], MD [phone] [address] [place],  [place] [address]  HISTORICAL INFORMATION:   Selected notes from the MEDICAL RECORD NUMBER       CURRENT MEDICATIONS: No current outpatient medications on file. (Ophthalmic Drugs)   No current facility-administered medications for this visit. (Ophthalmic Drugs)   Current Outpatient Medications (Other)  Medication Sig  . azelastine (ASTELIN) 0.1 % nasal spray Place 1 spray into both nostrils daily as needed for allergies.   . Dimethyl Fumarate 240 MG CPDR Take 1 capsule (240 mg total) by mouth in the morning and at bedtime.  . gabapentin (NEURONTIN) 300 MG capsule Take 1 capsule (300 mg total) by mouth 2 (two) times daily as needed.  Marland Kitchen losartan-hydrochlorothiazide (HYZAAR) 100-12.5 MG per tablet Take 1 tablet by mouth daily.   Marland Kitchen SYNTHROID 112 MCG tablet Take 112 mcg by mouth every other day.    No current facility-administered medications for this visit. (Other)      REVIEW OF SYSTEMS:    ALLERGIES Allergies  Allergen Reactions  . Codeine Other (See Comments)    Recovering addict - wishes not to consume if possible    PAST MEDICAL HISTORY Past Medical History:  Diagnosis Date  . Encounter for long-term (current) use of other  medications   . High blood pressure   . MS (multiple sclerosis) (HCC)   . Thyroid disease    Past Surgical History:  Procedure Laterality Date  . KNEE SURGERY    . LAPAROSCOPIC CHOLECYSTECTOMY SINGLE SITE WITH INTRAOPERATIVE CHOLANGIOGRAM N/A 12/02/2014   Procedure: LAPAROSCOPIC CHOLECYSTECTOMY SINGLE SITE WITH INTRAOPERATIVE CHOLANGIOGRAM;  Surgeon: Karie Soda, MD;  Location: [facility];  Service: General;  Laterality: N/A;  . left thumb    . VAGINAL HYSTERECTOMY      FAMILY HISTORY Family History  Problem Relation Age of Onset  . Breast cancer Sister   . Breast cancer Sister     SOCIAL HISTORY Social History   Tobacco Use  . Smoking status: Never Smoker  . Smokeless tobacco: Never Used  Vaping Use  . Vaping Use: Never used  Substance Use Topics  . Alcohol use: No    Alcohol/week: 0.0 standard drinks  . Drug use: No         OPHTHALMIC EXAM: Base Eye Exam    Visual Acuity (ETDRS)      Right Left   Dist [place] CF at 3' 20/25   Dist ph  NI 20/20       Tonometry (Tonopen, 1:25 PM)      Right Left   Pressure 19 20       Pupils  Pupils Dark Light Shape React APD   Right PERRL 4 3 Round Sluggish None   Left PERRL 4 3 Round Sluggish None       Visual Fields (Counting fingers)      Left Right    Full Full       Extraocular Movement      Right Left    Full Full       Neuro/Psych    Oriented x3: Yes   Mood/Affect: Normal       Dilation    Both eyes: 1.0% Mydriacyl, 2.5% Phenylephrine @ 1:25 PM        Slit Lamp and Fundus Exam    External Exam      Right Left   External Normal Normal       Slit Lamp Exam      Right Left   Lids/Lashes Normal Normal   Conjunctiva/Sclera White and quiet White and quiet   Cornea Clear Clear   Anterior Chamber Deep and quiet Deep and quiet   Iris Round and reactive Round and reactive   Lens 2+ Nuclear sclerosis 2+ Nuclear sclerosis   Anterior Vitreous Normal Normal       Fundus Exam      Right Left    Posterior Vitreous Posterior vitreous detachment Posterior vitreous detachment   Disc Normal Normal   C/D Ratio 0.6 0.65   Macula Disciform scar, 4 disc area size, subfoveal white scar, no active edges Normal   Vessels Normal Normal   Periphery Normal Normal          IMAGING AND PROCEDURES  Imaging and Procedures for 02/12/21  Color Fundus Photography Optos - OU - Both Eyes       Right Eye Progression has been stable. Disc findings include normal observations. Vessels : normal observations. Periphery : normal observations.   Left Eye Progression has been stable. Disc findings include normal observations. Macula : normal observations. Vessels : normal observations. Periphery : normal observations.   Notes White subfoveal disciform scar, no active edges  OD                ASSESSMENT/PLAN:  Nuclear sclerotic cataract of both eyes Stable OU, no impact on acuity  Exudative age-related macular degeneration of right eye with inactive choroidal neovascularization (HCC) No active maculopathy at this time as compared to onset May 2018      ICD-10-CM   1. Exudative age-related macular degeneration of right eye with inactive choroidal neovascularization (HCC)  H35.3212 Color Fundus Photography Optos - OU - Both Eyes  2. Nuclear sclerotic cataract of both eyes  H25.13     1.  OD, no active disease at this time.  No recurrences 2.  OS near normal macular findings,Not high risk for similar findings in the left eye  3.  Ophthalmic Meds Ordered this visit:  No orders of the defined types were placed in this encounter.      Return in about 9 months (around 11/14/2021) for DILATE OU, COLOR FP.  Patient Instructions  Patient instructed to contact the office promptly new onset visual acuity declines or distortions in the either eye but particularly the left eye  She is encouraged to wear some sort of protective eyewear when she is doing any type of home repair jobs or  anything that move involves flying projectiles, hammering nails or such other activity as to prevent injury to her good left eye    Explained the diagnoses, plan, and follow up with  the patient and they expressed understanding.  Patient expressed understanding of the importance of proper follow up care.   Alford Highland Anavey Coombes M.D. Diseases & Surgery of the Retina and Vitreous [facility] 02/12/21     Abbreviations: M myopia (nearsighted); A astigmatism; H hyperopia (farsighted); P presbyopia; Mrx spectacle prescription;  CTL contact lenses; OD right eye; OS left eye; OU both eyes  XT exotropia; ET esotropia; PEK punctate epithelial keratitis; PEE punctate epithelial erosions; DES dry eye syndrome; MGD meibomian gland dysfunction; ATs artificial tears; PFAT's preservative free artificial tears; NSC nuclear sclerotic cataract; PSC posterior subcapsular cataract; ERM epi-retinal membrane; PVD posterior vitreous detachment; RD retinal detachment; DM diabetes mellitus; DR diabetic retinopathy; NPDR non-proliferative diabetic retinopathy; PDR proliferative diabetic retinopathy; CSME clinically significant macular edema; DME diabetic macular edema; dbh dot blot hemorrhages; CWS cotton wool spot; POAG primary open angle glaucoma; C/D cup-to-disc ratio; HVF humphrey visual field; GVF goldmann visual field; OCT optical coherence tomography; IOP intraocular pressure; BRVO Branch retinal vein occlusion; CRVO central retinal vein occlusion; CRAO central retinal artery occlusion; BRAO branch retinal artery occlusion; RT retinal tear; SB scleral buckle; PPV pars plana vitrectomy; VH Vitreous hemorrhage; PRP panretinal laser photocoagulation; IVK intravitreal kenalog; VMT vitreomacular traction; MH Macular hole;  NVD neovascularization of the disc; NVE neovascularization elsewhere; AREDS age related eye disease study; ARMD age related macular degeneration; POAG primary open angle glaucoma; EBMD  epithelial/anterior basement membrane dystrophy; ACIOL anterior chamber intraocular lens; IOL intraocular lens; PCIOL posterior chamber intraocular lens; Phaco/IOL phacoemulsification with intraocular lens placement; PRK photorefractive keratectomy; LASIK laser assisted in situ keratomileusis; HTN hypertension; DM diabetes mellitus; COPD chronic obstructive pulmonary disease

## 2021-02-12 NOTE — Assessment & Plan Note (Signed)
Stable OU, no impact on acuity

## 2021-02-12 NOTE — Patient Instructions (Signed)
Patient instructed to contact the office promptly new onset visual acuity declines or distortions in the either eye but particularly the left eye  She is encouraged to wear some sort of protective eyewear when she is doing any type of home repair jobs or anything that move involves flying projectiles, hammering nails or such other activity as to prevent injury to her good left eye

## 2021-02-12 NOTE — Assessment & Plan Note (Signed)
No active maculopathy at this time as compared to onset May 2018

## 2021-02-14 DIAGNOSIS — M542 Cervicalgia: Secondary | ICD-10-CM | POA: Diagnosis not present

## 2021-02-14 DIAGNOSIS — R519 Headache, unspecified: Secondary | ICD-10-CM | POA: Diagnosis not present

## 2021-02-14 DIAGNOSIS — S060X0D Concussion without loss of consciousness, subsequent encounter: Secondary | ICD-10-CM | POA: Diagnosis not present

## 2021-02-19 DIAGNOSIS — S060X0D Concussion without loss of consciousness, subsequent encounter: Secondary | ICD-10-CM | POA: Diagnosis not present

## 2021-02-19 DIAGNOSIS — M542 Cervicalgia: Secondary | ICD-10-CM | POA: Diagnosis not present

## 2021-02-19 DIAGNOSIS — R519 Headache, unspecified: Secondary | ICD-10-CM | POA: Diagnosis not present

## 2021-05-12 DIAGNOSIS — R1310 Dysphagia, unspecified: Secondary | ICD-10-CM | POA: Diagnosis not present

## 2021-05-12 DIAGNOSIS — R079 Chest pain, unspecified: Secondary | ICD-10-CM | POA: Diagnosis not present

## 2021-05-12 DIAGNOSIS — R0789 Other chest pain: Secondary | ICD-10-CM | POA: Diagnosis not present

## 2021-05-14 DIAGNOSIS — F418 Other specified anxiety disorders: Secondary | ICD-10-CM | POA: Diagnosis not present

## 2021-07-22 ENCOUNTER — Other Ambulatory Visit: Payer: Self-pay | Admitting: Neurology

## 2021-08-01 ENCOUNTER — Ambulatory Visit: Payer: PPO | Admitting: Family Medicine

## 2021-08-02 ENCOUNTER — Telehealth: Payer: Self-pay | Admitting: Neurology

## 2021-08-02 MED ORDER — DIMETHYL FUMARATE 240 MG PO CPDR: 240.0000 mg | DELAYED_RELEASE_CAPSULE | Freq: Two times a day (BID) | ORAL | 5 refills | Status: DC

## 2021-08-02 NOTE — Telephone Encounter (Signed)
Susan Gomez from [facility] called needing to put in a refill request for the pts TECFIDERA 240 MG CPDR

## 2021-08-02 NOTE — Telephone Encounter (Signed)
Continuation of MS therapy. Pending appt 12/03/21. Refills sent to pharmacy.

## 2021-08-20 DIAGNOSIS — I10 Essential (primary) hypertension: Secondary | ICD-10-CM | POA: Diagnosis not present

## 2021-08-20 DIAGNOSIS — Z0000 Encounter for general adult medical examination without abnormal findings: Secondary | ICD-10-CM | POA: Diagnosis not present

## 2021-08-20 DIAGNOSIS — Z23 Encounter for immunization: Secondary | ICD-10-CM | POA: Diagnosis not present

## 2021-08-20 DIAGNOSIS — E7800 Pure hypercholesterolemia, unspecified: Secondary | ICD-10-CM | POA: Diagnosis not present

## 2021-08-20 DIAGNOSIS — E039 Hypothyroidism, unspecified: Secondary | ICD-10-CM | POA: Diagnosis not present

## 2021-08-26 DIAGNOSIS — R059 Cough, unspecified: Secondary | ICD-10-CM | POA: Diagnosis not present

## 2021-08-26 DIAGNOSIS — Z03818 Encounter for observation for suspected exposure to other biological agents ruled out: Secondary | ICD-10-CM | POA: Diagnosis not present

## 2021-08-26 DIAGNOSIS — B349 Viral infection, unspecified: Secondary | ICD-10-CM | POA: Diagnosis not present

## 2021-09-02 ENCOUNTER — Other Ambulatory Visit: Payer: Self-pay | Admitting: Family Medicine

## 2021-09-02 DIAGNOSIS — Z1231 Encounter for screening mammogram for malignant neoplasm of breast: Secondary | ICD-10-CM

## 2021-09-29 DIAGNOSIS — H6123 Impacted cerumen, bilateral: Secondary | ICD-10-CM | POA: Diagnosis not present

## 2021-09-29 DIAGNOSIS — H6983 Other specified disorders of Eustachian tube, bilateral: Secondary | ICD-10-CM | POA: Diagnosis not present

## 2021-10-04 ENCOUNTER — Ambulatory Visit: Payer: PPO

## 2021-11-05 ENCOUNTER — Ambulatory Visit
Admission: RE | Admit: 2021-11-05 | Discharge: 2021-11-05 | Disposition: A | Discharge status: Home / Self Care | Payer: Medicare HMO | Source: Ambulatory Visit | Attending: Family Medicine | Admitting: Family Medicine

## 2021-11-05 DIAGNOSIS — Z1231 Encounter for screening mammogram for malignant neoplasm of breast: Secondary | ICD-10-CM

## 2021-11-12 ENCOUNTER — Other Ambulatory Visit: Payer: Self-pay

## 2021-11-12 ENCOUNTER — Ambulatory Visit (INDEPENDENT_AMBULATORY_CARE_PROVIDER_SITE_OTHER): Payer: Medicare HMO | Admitting: Ophthalmology

## 2021-11-12 ENCOUNTER — Encounter (INDEPENDENT_AMBULATORY_CARE_PROVIDER_SITE_OTHER): Payer: Self-pay | Admitting: Ophthalmology

## 2021-11-12 DIAGNOSIS — H353212 Exudative age-related macular degeneration, right eye, with inactive choroidal neovascularization: Secondary | ICD-10-CM | POA: Diagnosis not present

## 2021-11-12 DIAGNOSIS — H2513 Age-related nuclear cataract, bilateral: Secondary | ICD-10-CM

## 2021-11-12 NOTE — Assessment & Plan Note (Signed)
Large subfoveal disciform scar not active

## 2021-11-12 NOTE — Progress Notes (Signed)
11/12/2021     CHIEF COMPLAINT Patient presents for  Chief Complaint  Patient presents with   Retina Follow Up    History of wet AMD OD treated years previous, no signs of recurrence in recent years.  No new symptoms OS or OD  HISTORY OF PRESENT ILLNESS: Susan Gomez is a 66 y.o. female who presents to the clinic today for:   HPI     Retina Follow Up           Diagnosis: Other   Laterality: both eyes   Onset: 8 months ago   Severity: mild   Duration: 8 months   Course: stable         Comments   8 month fu OU and OCT/FP  History of inactive subfoveal CNVM, DISCIFORM SCAR, not currently active nor for some years, OD No new symptoms OS Pt states VA OU stable since last visit. Pt denies FOL, floaters, or ocular pain OU.  Pt states, "Some days I feel like I can see more out of my right eye than I can on others. There are good days and then there are bad."        Last edited by Edmon Crape, MD on 11/12/2021  3:46 PM.      Referring physician: [name], MD [phone] [address] [place],  [place] [address]  HISTORICAL INFORMATION:   Selected notes from the MEDICAL RECORD NUMBER       CURRENT MEDICATIONS: No current outpatient medications on file. (Ophthalmic Drugs)   No current facility-administered medications for this visit. (Ophthalmic Drugs)   Current Outpatient Medications (Other)  Medication Sig   azelastine (ASTELIN) 0.1 % nasal spray Place 1 spray into both nostrils daily as needed for allergies.    Dimethyl Fumarate 240 MG CPDR Take 1 capsule (240 mg total) by mouth 2 (two) times daily.   gabapentin (NEURONTIN) 300 MG capsule Take 1 capsule (300 mg total) by mouth 2 (two) times daily as needed.   losartan-hydrochlorothiazide (HYZAAR) 100-12.5 MG per tablet Take 1 tablet by mouth daily.    SYNTHROID 112 MCG tablet Take 112 mcg by mouth every other day.    No current facility-administered medications for this visit. (Other)       REVIEW OF SYSTEMS:    ALLERGIES Allergies  Allergen Reactions   Codeine Other (See Comments)    Recovering addict - wishes not to consume if possible    PAST MEDICAL HISTORY Past Medical History:  Diagnosis Date   Encounter for long-term (current) use of other medications    High blood pressure    MS (multiple sclerosis) (HCC)    Thyroid disease    Past Surgical History:  Procedure Laterality Date   KNEE SURGERY     LAPAROSCOPIC CHOLECYSTECTOMY SINGLE SITE WITH INTRAOPERATIVE CHOLANGIOGRAM N/A 12/02/2014   Procedure: LAPAROSCOPIC CHOLECYSTECTOMY SINGLE SITE WITH INTRAOPERATIVE CHOLANGIOGRAM;  Surgeon: Karie Soda, MD;  Location: [facility];  Service: General;  Laterality: N/A;   left thumb     VAGINAL HYSTERECTOMY      FAMILY HISTORY Family History  Problem Relation Age of Onset   Breast cancer Sister    Breast cancer Sister     SOCIAL HISTORY Social History   Tobacco Use   Smoking status: Never   Smokeless tobacco: Never  Vaping Use   Vaping Use: Never used  Substance Use Topics   Alcohol use: No    Alcohol/week: 0.0 standard drinks   Drug use:  No         OPHTHALMIC EXAM:  Base Eye Exam     Visual Acuity (ETDRS)       Right Left   Dist [place] CF at 5' 20/25 +2         Tonometry (Tonopen, 2:54 PM)       Right Left   Pressure 18 19         Pupils       Pupils Dark Light Shape React APD   Right PERRL 4 3 Round Sluggish None   Left PERRL 4 3 Round Sluggish None         Visual Fields (Counting fingers)       Left Right    Full Full         Extraocular Movement       Right Left    Full, Ortho Full, Ortho         Neuro/Psych     Oriented x3: Yes   Mood/Affect: Normal         Dilation     Both eyes: 1.0% Mydriacyl, 2.5% Phenylephrine @ 2:53 PM           Slit Lamp and Fundus Exam     External Exam       Right Left   External Normal Normal         Slit Lamp Exam       Right Left   Lids/Lashes  Normal Normal   Conjunctiva/Sclera White and quiet White and quiet   Cornea Clear Clear   Anterior Chamber Deep and quiet Deep and quiet   Iris Round and reactive Round and reactive   Lens 2+ Nuclear sclerosis 2+ Nuclear sclerosis   Anterior Vitreous Normal Normal         Fundus Exam       Right Left   Posterior Vitreous Posterior vitreous detachment Posterior vitreous detachment   Disc Normal Normal   C/D Ratio 0.6 0.65   Macula Disciform scar, 4 disc area size, subfoveal white scar, no active edges Normal   Vessels Normal Normal   Periphery Normal Normal            IMAGING AND PROCEDURES  Imaging and Procedures for 11/12/21  Color Fundus Photography Optos - OU - Both Eyes       Right Eye Progression has been stable. Disc findings include normal observations. Vessels : normal observations. Periphery : normal observations.   Left Eye Progression has been stable. Disc findings include normal observations. Macula : normal observations. Vessels : normal observations. Periphery : normal observations.   Notes White subfoveal disciform scar, no active edges  OD     OCT, Retina - OU - Both Eyes       Right Eye Quality was good. Scan locations included subfoveal. Central Foveal Thickness: 613. Findings include abnormal foveal contour, no SRF, no IRF, subretinal scarring.   Left Eye Quality was good. Scan locations included subfoveal. Central Foveal Thickness: 236. Findings include normal foveal contour, no SRF, no IRF.   Notes Large subfoveal fibrotic scar, without intraretinal fluid nor subretinal fluid, stable over the last several years.  This conforms and corresponds to the white subfoveal fibrosis               ASSESSMENT/PLAN:  Nuclear sclerotic cataract of both eyes Needs general ophthalmology examination patient does not have a routine eye examination person nor optometrist nor ophthalmologist  Exudative age-related macular degeneration of right  eye with inactive choroidal neovascularization (HCC) Large subfoveal disciform scar not active      ICD-10-CM   1. Exudative age-related macular degeneration of right eye with inactive choroidal neovascularization (HCC)  H35.3212 Color Fundus Photography Optos - OU - Both Eyes    OCT, Retina - OU - Both Eyes    2. Nuclear sclerotic cataract of both eyes  H25.13       1.  Essentially monocular patient with excellent acuity left eye no signs of macular or retinal conditions threatening vision  2.  Gress of NSC cataract changes in each eye likely needs cataract surgery in the coming decades will need general ophthalmology or routine eye examination with optometry to monitor this going forward  3.  Ophthalmic Meds Ordered this visit:  No orders of the defined types were placed in this encounter.      Return in about 1 year (around 11/12/2022) for DILATE OU, COLOR FP, OCT.  There are no Patient Instructions on file for this visit.   Explained the diagnoses, plan, and follow up with the patient and they expressed understanding.  Patient expressed understanding of the importance of proper follow up care.   Alford Highland Johne Buckle M.D. Diseases & Surgery of the Retina and Vitreous [facility] 11/12/21     Abbreviations: M myopia (nearsighted); A astigmatism; H hyperopia (farsighted); P presbyopia; Mrx spectacle prescription;  CTL contact lenses; OD right eye; OS left eye; OU both eyes  XT exotropia; ET esotropia; PEK punctate epithelial keratitis; PEE punctate epithelial erosions; DES dry eye syndrome; MGD meibomian gland dysfunction; ATs artificial tears; PFAT's preservative free artificial tears; NSC nuclear sclerotic cataract; PSC posterior subcapsular cataract; ERM epi-retinal membrane; PVD posterior vitreous detachment; RD retinal detachment; DM diabetes mellitus; DR diabetic retinopathy; NPDR non-proliferative diabetic retinopathy; PDR proliferative diabetic retinopathy;  CSME clinically significant macular edema; DME diabetic macular edema; dbh dot blot hemorrhages; CWS cotton wool spot; POAG primary open angle glaucoma; C/D cup-to-disc ratio; HVF humphrey visual field; GVF goldmann visual field; OCT optical coherence tomography; IOP intraocular pressure; BRVO Branch retinal vein occlusion; CRVO central retinal vein occlusion; CRAO central retinal artery occlusion; BRAO branch retinal artery occlusion; RT retinal tear; SB scleral buckle; PPV pars plana vitrectomy; VH Vitreous hemorrhage; PRP panretinal laser photocoagulation; IVK intravitreal kenalog; VMT vitreomacular traction; MH Macular hole;  NVD neovascularization of the disc; NVE neovascularization elsewhere; AREDS age related eye disease study; ARMD age related macular degeneration; POAG primary open angle glaucoma; EBMD epithelial/anterior basement membrane dystrophy; ACIOL anterior chamber intraocular lens; IOL intraocular lens; PCIOL posterior chamber intraocular lens; Phaco/IOL phacoemulsification with intraocular lens placement; PRK photorefractive keratectomy; LASIK laser assisted in situ keratomileusis; HTN hypertension; DM diabetes mellitus; COPD chronic obstructive pulmonary disease

## 2021-11-12 NOTE — Assessment & Plan Note (Signed)
Needs general ophthalmology examination patient does not have a routine eye examination person nor optometrist nor ophthalmologist

## 2021-11-14 ENCOUNTER — Encounter (INDEPENDENT_AMBULATORY_CARE_PROVIDER_SITE_OTHER): Payer: PPO | Admitting: Ophthalmology

## 2021-11-26 ENCOUNTER — Telehealth: Payer: Self-pay

## 2021-11-26 NOTE — Telephone Encounter (Signed)
Received a PA request for dimethyl fumarate from [place]. Completed via phone with [employer]. Ref#: [id number]. Should have a determination within 1-3 days.

## 2021-12-03 ENCOUNTER — Ambulatory Visit: Payer: PPO | Admitting: Family Medicine

## 2021-12-10 NOTE — Telephone Encounter (Signed)
Received this notice from [employer]: "Dimethyl Fumarate 240 MG DR- PA approved 11/03/2021-11/02/2022 PA# [id number]"

## 2022-01-23 ENCOUNTER — Telehealth: Payer: Self-pay | Admitting: Neurology

## 2022-01-23 DIAGNOSIS — H6123 Impacted cerumen, bilateral: Secondary | ICD-10-CM | POA: Diagnosis not present

## 2022-01-23 DIAGNOSIS — I10 Essential (primary) hypertension: Secondary | ICD-10-CM | POA: Diagnosis not present

## 2022-01-23 DIAGNOSIS — H6691 Otitis media, unspecified, right ear: Secondary | ICD-10-CM | POA: Diagnosis not present

## 2022-01-23 NOTE — Telephone Encounter (Signed)
Pt states since Sunday she has had muscle pain in right arm.  Pt states there are certain things she is unable to hold due to pain.  Pt states she does not feel the need to go to ED but she would like a call to discuss. ?

## 2022-01-23 NOTE — Telephone Encounter (Signed)
I spoke to the patient. She developed some right arm discomfort this past Sunday, 01/19/22. No known injury. No other symptoms. No swelling or redness. She tried to exercise it to make it better. Unfortunately, it caused her pain to increase and to be more persistent. She feels it is muscular pain. Encouraged her to rest it, limit the activities that cause the pain to worsen and try NSAIDS (per the instructions on the bottle). If it continues, she should contact her PCP for further evaluation and/or treatment. She was in agreement with this plan. ?

## 2022-01-26 DIAGNOSIS — R519 Headache, unspecified: Secondary | ICD-10-CM | POA: Diagnosis not present

## 2022-01-29 DIAGNOSIS — K1121 Acute sialoadenitis: Secondary | ICD-10-CM | POA: Diagnosis not present

## 2022-01-29 DIAGNOSIS — H6123 Impacted cerumen, bilateral: Secondary | ICD-10-CM | POA: Diagnosis not present

## 2022-02-18 ENCOUNTER — Telehealth: Payer: Self-pay | Admitting: Neurology

## 2022-02-18 MED ORDER — DIMETHYL FUMARATE 240 MG PO CPDR: 240.0000 mg | DELAYED_RELEASE_CAPSULE | Freq: Two times a day (BID) | ORAL | 3 refills | Status: DC

## 2022-02-18 NOTE — Telephone Encounter (Signed)
Pending appt 04/22/22. Refills sent to pharmacy. ?

## 2022-02-18 NOTE — Telephone Encounter (Signed)
Susan Gomez from [facility] called requesting a refill for the pt's Dimethyl Fumarate 240 MG CPDR ?

## 2022-04-05 DIAGNOSIS — K1120 Sialoadenitis, unspecified: Secondary | ICD-10-CM | POA: Diagnosis not present

## 2022-04-21 ENCOUNTER — Ambulatory Visit: Payer: Self-pay | Admitting: Family Medicine

## 2022-04-22 ENCOUNTER — Telehealth: Payer: Self-pay | Admitting: Neurology

## 2022-04-22 ENCOUNTER — Ambulatory Visit: Payer: Self-pay | Admitting: Family Medicine

## 2022-04-22 ENCOUNTER — Encounter: Payer: Self-pay | Admitting: Neurology

## 2022-04-22 ENCOUNTER — Ambulatory Visit: Payer: Medicare HMO | Admitting: Neurology

## 2022-04-22 VITALS — BP 147/82 | HR 62 | Ht 63.0 in | Wt 208.5 lb

## 2022-04-22 DIAGNOSIS — G35 Multiple sclerosis: Secondary | ICD-10-CM

## 2022-04-22 DIAGNOSIS — M5441 Lumbago with sciatica, right side: Secondary | ICD-10-CM | POA: Diagnosis not present

## 2022-04-22 DIAGNOSIS — G8929 Other chronic pain: Secondary | ICD-10-CM

## 2022-04-22 MED ORDER — DIMETHYL FUMARATE 240 MG PO CPDR: 240.0000 mg | DELAYED_RELEASE_CAPSULE | Freq: Two times a day (BID) | ORAL | 11 refills | Status: DC

## 2022-04-22 MED ORDER — GABAPENTIN 300 MG PO CAPS: 300.0000 mg | ORAL_CAPSULE | Freq: Two times a day (BID) | ORAL | 3 refills | Status: DC | PRN
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
## 2022-04-22 NOTE — Telephone Encounter (Signed)
Called the patient in regards to her apt that is scheduled today. Pt is a established pt with Maralyn Sago, NP and was scheduled with Amy when Maralyn Sago was on maternity leave. She did not have her f/u with Amy so with Maralyn Sago being back we called to see if we could get her back on Sarah's schedule.  There was no answer. Left a detailed message advising of this information. Advised I could work her in today with Sarah at 12:45 pm or blocked a spot on 6/22 that I could offer to the patient. I will await to hear back from the patient.

## 2022-04-23 ENCOUNTER — Telehealth: Payer: Self-pay | Admitting: *Deleted

## 2022-04-23 LAB — COMPREHENSIVE METABOLIC PANEL
ALT: 8 IU/L (ref 0–32)
AST: 15 IU/L (ref 0–40)
Albumin/Globulin Ratio: 1.4 (ref 1.2–2.2)
Albumin: 4.4 g/dL (ref 3.8–4.8)
Alkaline Phosphatase: 44 IU/L (ref 44–121)
BUN/Creatinine Ratio: 12 (ref 12–28)
BUN: 11 mg/dL (ref 8–27)
Bilirubin Total: 0.8 mg/dL (ref 0.0–1.2)
CO2: 26 mmol/L (ref 20–29)
Calcium: 9.8 mg/dL (ref 8.7–10.3)
Chloride: 98 mmol/L (ref 96–106)
Creatinine, Ser: 0.93 mg/dL (ref 0.57–1.00)
Globulin, Total: 3.1 g/dL (ref 1.5–4.5)
Glucose: 103 mg/dL — ABNORMAL HIGH (ref 70–99)
Potassium: 3.6 mmol/L (ref 3.5–5.2)
Sodium: 137 mmol/L (ref 134–144)
Total Protein: 7.5 g/dL (ref 6.0–8.5)
eGFR: 69 mL/min/{1.73_m2} (ref >59)

## 2022-04-23 LAB — CBC WITH DIFFERENTIAL/PLATELET
Basophils Absolute: 0.1 10*3/uL (ref 0.0–0.2)
Basos: 1 %
EOS (ABSOLUTE): 0 10*3/uL (ref 0.0–0.4)
Eos: 1 %
Hematocrit: 36.1 % (ref 34.0–46.6)
Hemoglobin: 12.4 g/dL (ref 11.1–15.9)
Immature Grans (Abs): 0 10*3/uL (ref 0.0–0.1)
Immature Granulocytes: 0 %
Lymphocytes Absolute: 2.6 10*3/uL (ref 0.7–3.1)
Lymphs: 54 %
MCH: 29.9 pg (ref 26.6–33.0)
MCHC: 34.3 g/dL (ref 31.5–35.7)
MCV: 87 fL (ref 79–97)
Monocytes Absolute: 0.4 10*3/uL (ref 0.1–0.9)
Monocytes: 9 %
Neutrophils Absolute: 1.6 10*3/uL (ref 1.4–7.0)
Neutrophils: 35 %
Platelets: 281 10*3/uL (ref 150–450)
RBC: 4.15 x10E6/uL (ref 3.77–5.28)
RDW: 12.9 % (ref 11.7–15.4)
WBC: 4.8 10*3/uL (ref 3.4–10.8)

## 2022-04-23 NOTE — Telephone Encounter (Signed)
-----   Message from Glean Salvo, NP sent at 04/23/2022  7:35 AM EDT ----- Please call, labs are unremarkable.

## 2022-04-23 NOTE — Telephone Encounter (Signed)
I spoke to the patient and provided her with the lab results. 

## 2022-05-01 DIAGNOSIS — M779 Enthesopathy, unspecified: Secondary | ICD-10-CM | POA: Diagnosis not present

## 2022-05-03 IMAGING — DX DG FINGER MIDDLE 2+V*L*
3 series · 3 of 3 positions shown · non-contrast
Comparison: None

CLINICAL DATA: Injured playing with dog 4 weeks ago, persistent
pain at DIP joint, impaired flexion

EXAM:
LEFT MIDDLE FINGER 2+V

[finger pa (1 of 2)]
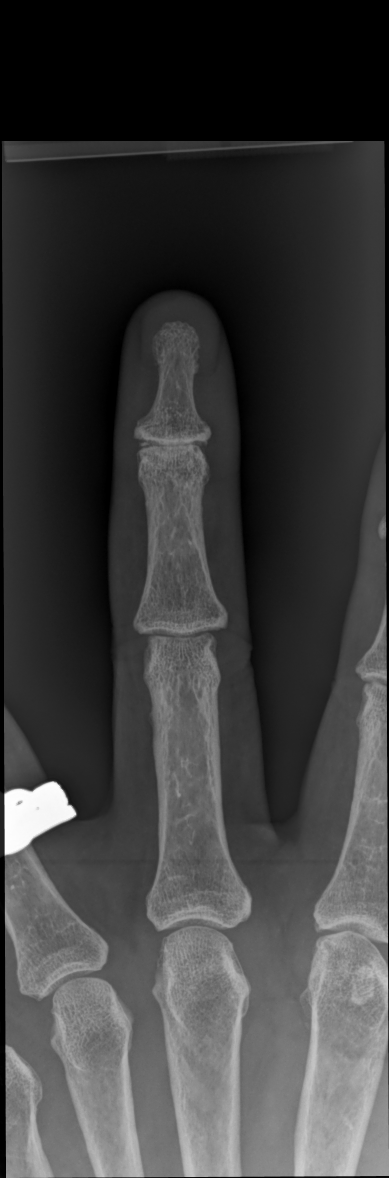

[finger pa (2 of 2)]
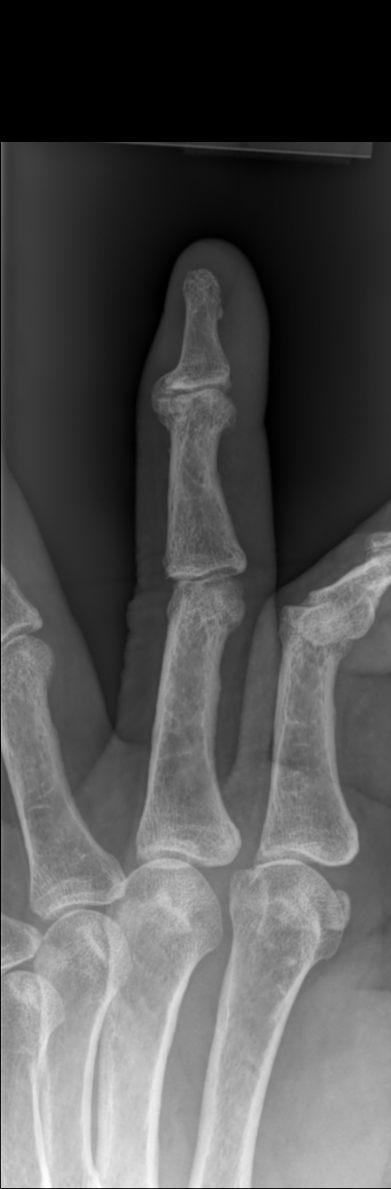

[finger lat]
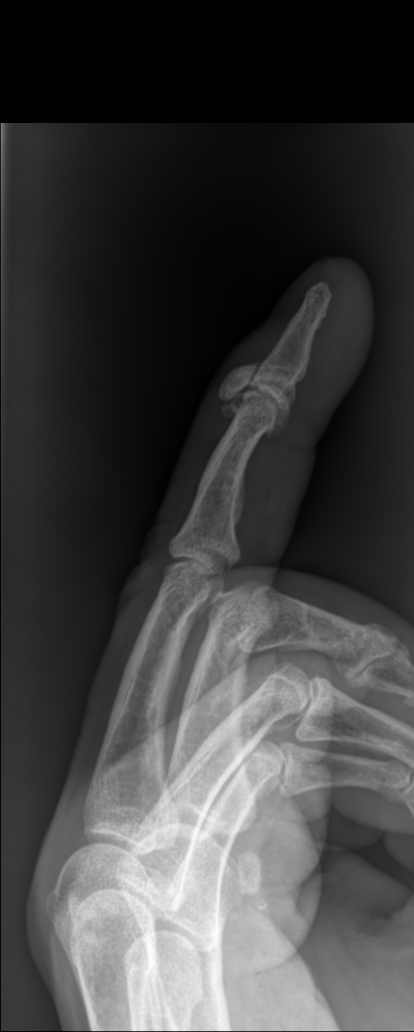

[3 of 3 positions shown; findings below may reference images not displayed]

FINDINGS: Osseous mineralization normal.

Joint space narrowing and spur formation at DIP joint.

Ununited ossicle is identified at the dorsal margin of the DIP joint
likely representing a subacute to old fracture of a large dorsal
spur arising from the base of the distal phalanx.

No additional fracture, dislocation, or bone destruction.
IMPRESSION: Advanced degenerative changes DIP joint LEFT middle finger.

Non fused ossicle at dorsal margin of the DIP joint likely
representing a subacute to old fracture of a dorsal spur arising
from the base of the distal phalanx.

## 2022-05-12 ENCOUNTER — Other Ambulatory Visit: Payer: Self-pay | Admitting: Orthopedic Surgery

## 2022-05-12 DIAGNOSIS — M79645 Pain in left finger(s): Secondary | ICD-10-CM | POA: Diagnosis not present

## 2022-05-12 DIAGNOSIS — M19042 Primary osteoarthritis, left hand: Secondary | ICD-10-CM | POA: Diagnosis not present

## 2022-05-20 DIAGNOSIS — M19049 Primary osteoarthritis, unspecified hand: Secondary | ICD-10-CM | POA: Diagnosis not present

## 2022-05-20 DIAGNOSIS — I10 Essential (primary) hypertension: Secondary | ICD-10-CM | POA: Diagnosis not present

## 2022-05-20 DIAGNOSIS — M65312 Trigger thumb, left thumb: Secondary | ICD-10-CM | POA: Diagnosis not present

## 2022-05-20 DIAGNOSIS — E039 Hypothyroidism, unspecified: Secondary | ICD-10-CM | POA: Diagnosis not present

## 2022-05-20 DIAGNOSIS — G35 Multiple sclerosis: Secondary | ICD-10-CM | POA: Diagnosis not present

## 2022-05-20 DIAGNOSIS — E7800 Pure hypercholesterolemia, unspecified: Secondary | ICD-10-CM | POA: Diagnosis not present

## 2022-05-28 ENCOUNTER — Encounter (HOSPITAL_BASED_OUTPATIENT_CLINIC_OR_DEPARTMENT_OTHER): Payer: Self-pay | Admitting: Orthopedic Surgery

## 2022-05-28 ENCOUNTER — Other Ambulatory Visit: Payer: Self-pay

## 2022-05-29 ENCOUNTER — Encounter (HOSPITAL_BASED_OUTPATIENT_CLINIC_OR_DEPARTMENT_OTHER)
Admission: RE | Admit: 2022-05-29 | Discharge: 2022-05-29 | Disposition: A | Discharge status: Home / Self Care | Payer: Medicare HMO | Source: Ambulatory Visit | Attending: Orthopedic Surgery | Admitting: Orthopedic Surgery

## 2022-05-29 DIAGNOSIS — Z01810 Encounter for preprocedural cardiovascular examination: Secondary | ICD-10-CM | POA: Diagnosis not present

## 2022-05-29 NOTE — Progress Notes (Signed)

## 2022-06-03 ENCOUNTER — Other Ambulatory Visit: Payer: Self-pay

## 2022-06-03 ENCOUNTER — Ambulatory Visit (HOSPITAL_BASED_OUTPATIENT_CLINIC_OR_DEPARTMENT_OTHER): Payer: Medicare HMO | Admitting: Anesthesiology

## 2022-06-03 ENCOUNTER — Encounter (HOSPITAL_BASED_OUTPATIENT_CLINIC_OR_DEPARTMENT_OTHER): Payer: Self-pay | Admitting: Orthopedic Surgery

## 2022-06-03 ENCOUNTER — Ambulatory Visit (HOSPITAL_BASED_OUTPATIENT_CLINIC_OR_DEPARTMENT_OTHER)
Admission: RE | Admit: 2022-06-03 | Discharge: 2022-06-03 | Disposition: A | Discharge status: Home / Self Care | Payer: Medicare HMO | Attending: Orthopedic Surgery | Admitting: Orthopedic Surgery

## 2022-06-03 ENCOUNTER — Encounter (HOSPITAL_BASED_OUTPATIENT_CLINIC_OR_DEPARTMENT_OTHER)
Admission: RE | Disposition: A | Discharge status: Home / Self Care | Payer: Self-pay | Source: Home / Self Care | Attending: Orthopedic Surgery

## 2022-06-03 DIAGNOSIS — M65312 Trigger thumb, left thumb: Secondary | ICD-10-CM | POA: Insufficient documentation

## 2022-06-03 DIAGNOSIS — I10 Essential (primary) hypertension: Secondary | ICD-10-CM | POA: Diagnosis not present

## 2022-06-03 DIAGNOSIS — M67442 Ganglion, left hand: Secondary | ICD-10-CM | POA: Insufficient documentation

## 2022-06-03 DIAGNOSIS — M71342 Other bursal cyst, left hand: Secondary | ICD-10-CM | POA: Diagnosis not present

## 2022-06-03 DIAGNOSIS — E039 Hypothyroidism, unspecified: Secondary | ICD-10-CM | POA: Insufficient documentation

## 2022-06-03 DIAGNOSIS — M19042 Primary osteoarthritis, left hand: Secondary | ICD-10-CM

## 2022-06-03 DIAGNOSIS — L729 Follicular cyst of the skin and subcutaneous tissue, unspecified: Secondary | ICD-10-CM

## 2022-06-03 DIAGNOSIS — M25742 Osteophyte, left hand: Secondary | ICD-10-CM | POA: Diagnosis not present

## 2022-06-03 DIAGNOSIS — Z01818 Encounter for other preprocedural examination: Secondary | ICD-10-CM

## 2022-06-03 HISTORY — DX: Hypothyroidism, unspecified: E03.9

## 2022-06-03 HISTORY — PX: CYST EXCISION: SHX5701

## 2022-06-03 HISTORY — PX: TRIGGER FINGER RELEASE: SHX641

## 2022-06-03 HISTORY — PX: TENDON EXPLORATION: SHX5112

## 2022-06-03 SURGERY — RELEASE, A1 PULLEY, FOR TRIGGER FINGER
Anesthesia: Monitor Anesthesia Care | Site: Thumb | Laterality: Left

## 2022-06-03 MED ORDER — OXYCODONE HCL 5 MG/5ML PO SOLN: 5.0000 mg | Freq: Once | ORAL | Status: DC | PRN

## 2022-06-03 MED ORDER — PROPOFOL 10 MG/ML IV BOLUS
INTRAVENOUS | Status: AC
Start: 2022-06-03 — End: ?
  Filled 2022-06-03: qty 20

## 2022-06-03 MED ORDER — PROPOFOL 10 MG/ML IV BOLUS
INTRAVENOUS | Status: DC | PRN
  Administered 2022-06-03 (×9): 20 mg via INTRAVENOUS

## 2022-06-03 MED ORDER — PROPOFOL 500 MG/50ML IV EMUL
INTRAVENOUS | Status: AC
  Filled 2022-06-03: qty 50

## 2022-06-03 MED ORDER — CEFAZOLIN SODIUM-DEXTROSE 2-4 GM/100ML-% IV SOLN
2.0000 g | INTRAVENOUS | Status: AC
  Administered 2022-06-03: 2 g via INTRAVENOUS

## 2022-06-03 MED ORDER — LIDOCAINE HCL (CARDIAC) PF 100 MG/5ML IV SOSY
PREFILLED_SYRINGE | INTRAVENOUS | Status: DC | PRN
  Administered 2022-06-03: 20 mg via INTRAVENOUS

## 2022-06-03 MED ORDER — ONDANSETRON HCL 4 MG/2ML IJ SOLN
INTRAMUSCULAR | Status: AC
  Filled 2022-06-03: qty 2

## 2022-06-03 MED ORDER — OXYCODONE HCL 5 MG PO TABS: 5.0000 mg | ORAL_TABLET | Freq: Once | ORAL | Status: DC | PRN

## 2022-06-03 MED ORDER — LACTATED RINGERS IV SOLN: INTRAVENOUS | Status: DC

## 2022-06-03 MED ORDER — ACETAMINOPHEN 500 MG PO TABS
1000.0000 mg | ORAL_TABLET | Freq: Once | ORAL | Status: AC
Start: 2022-06-03 — End: 2022-06-03
  Administered 2022-06-03: 1000 mg via ORAL

## 2022-06-03 MED ORDER — FENTANYL CITRATE (PF) 100 MCG/2ML IJ SOLN
INTRAMUSCULAR | Status: AC
  Filled 2022-06-03: qty 2

## 2022-06-03 MED ORDER — ACETAMINOPHEN 500 MG PO TABS
ORAL_TABLET | ORAL | Status: AC
  Filled 2022-06-03: qty 2

## 2022-06-03 MED ORDER — PROPOFOL 500 MG/50ML IV EMUL
INTRAVENOUS | Status: DC | PRN
  Administered 2022-06-03: 75 ug/kg/min via INTRAVENOUS

## 2022-06-03 MED ORDER — FENTANYL CITRATE (PF) 100 MCG/2ML IJ SOLN
25.0000 ug | INTRAMUSCULAR | Status: DC | PRN
  Administered 2022-06-03: 50 ug via INTRAVENOUS

## 2022-06-03 MED ORDER — 0.9 % SODIUM CHLORIDE (POUR BTL) OPTIME
TOPICAL | Status: DC | PRN
  Administered 2022-06-03: 120 mL

## 2022-06-03 MED ORDER — HYDROCODONE-ACETAMINOPHEN 5-325 MG PO TABS: ORAL_TABLET | ORAL | 0 refills | Status: DC

## 2022-06-03 MED ORDER — BUPIVACAINE HCL (PF) 0.25 % IJ SOLN
INTRAMUSCULAR | Status: DC | PRN
  Administered 2022-06-03: 8 mL

## 2022-06-03 MED ORDER — AMISULPRIDE (ANTIEMETIC) 5 MG/2ML IV SOLN
10.0000 mg | Freq: Once | INTRAVENOUS | Status: DC | PRN
Start: 2022-06-03 — End: 2022-06-03

## 2022-06-03 MED ORDER — LIDOCAINE HCL (PF) 0.5 % IJ SOLN
INTRAMUSCULAR | Status: DC | PRN
  Administered 2022-06-03: 30 mL via INTRAVENOUS

## 2022-06-03 MED ORDER — ONDANSETRON HCL 4 MG/2ML IJ SOLN
INTRAMUSCULAR | Status: DC | PRN
  Administered 2022-06-03: 4 mg via INTRAVENOUS

## 2022-06-03 MED ORDER — MIDAZOLAM HCL 5 MG/5ML IJ SOLN
INTRAMUSCULAR | Status: DC | PRN
  Administered 2022-06-03: 2 mg via INTRAVENOUS

## 2022-06-03 MED ORDER — MIDAZOLAM HCL 2 MG/2ML IJ SOLN
INTRAMUSCULAR | Status: AC
  Filled 2022-06-03: qty 2

## 2022-06-03 MED ORDER — CEFAZOLIN SODIUM-DEXTROSE 2-4 GM/100ML-% IV SOLN
INTRAVENOUS | Status: AC
  Filled 2022-06-03: qty 100

## 2022-06-03 MED ORDER — ONDANSETRON HCL 4 MG/2ML IJ SOLN: 4.0000 mg | Freq: Once | INTRAMUSCULAR | Status: DC | PRN

## 2022-06-03 SURGICAL SUPPLY — 58 items
APL PRP STRL LF DISP 70% ISPRP (MISCELLANEOUS) ×2
APL SKNCLS STERI-STRIP NONHPOA (GAUZE/BANDAGES/DRESSINGS)
BANDAGE GAUZE 1X75IN STRL (MISCELLANEOUS) IMPLANT
BENZOIN TINCTURE PRP APPL 2/3 (GAUZE/BANDAGES/DRESSINGS) IMPLANT
BLADE MINI RND TIP GREEN BEAV (BLADE) IMPLANT
BLADE SURG 15 STRL LF DISP TIS (BLADE) ×4 IMPLANT
BLADE SURG 15 STRL SS (BLADE) ×6
BNDG CMPR 5X2 CHSV 1 LYR STRL (GAUZE/BANDAGES/DRESSINGS) ×2
BNDG CMPR 75X11 PLY HI ABS (MISCELLANEOUS)
BNDG CMPR 75X21 PLY HI ABS (MISCELLANEOUS)
BNDG CMPR 9X4 STRL LF SNTH (GAUZE/BANDAGES/DRESSINGS)
BNDG COHESIVE 1X5 TAN STRL LF (GAUZE/BANDAGES/DRESSINGS) IMPLANT
BNDG COHESIVE 2X5 TAN ST LF (GAUZE/BANDAGES/DRESSINGS) ×3 IMPLANT
BNDG ELASTIC 2X5.8 VLCR STR LF (GAUZE/BANDAGES/DRESSINGS) IMPLANT
BNDG ELASTIC 3X5.8 VLCR STR LF (GAUZE/BANDAGES/DRESSINGS) IMPLANT
BNDG ESMARK 4X9 LF (GAUZE/BANDAGES/DRESSINGS) IMPLANT
BNDG GAUZE 1X75IN STRL (MISCELLANEOUS)
BNDG GAUZE DERMACEA FLUFF (GAUZE/BANDAGES/DRESSINGS)
BNDG GAUZE DERMACEA FLUFF 4 (GAUZE/BANDAGES/DRESSINGS) IMPLANT
BNDG GZE DERMACEA 4 6PLY (GAUZE/BANDAGES/DRESSINGS)
BNDG PLASTER X FAST 3X3 WHT LF (CAST SUPPLIES) IMPLANT
BNDG PLSTR 9X3 FST ST WHT (CAST SUPPLIES)
CHLORAPREP W/TINT 26 (MISCELLANEOUS) ×3 IMPLANT
CORD BIPOLAR FORCEPS 12FT (ELECTRODE) ×3 IMPLANT
COVER BACK TABLE 60X90IN (DRAPES) ×3 IMPLANT
COVER MAYO STAND STRL (DRAPES) ×3 IMPLANT
CUFF TOURN SGL QUICK 18X4 (TOURNIQUET CUFF) ×3 IMPLANT
DRAPE EXTREMITY T 121X128X90 (DISPOSABLE) ×3 IMPLANT
DRAPE SURG 17X23 STRL (DRAPES) ×3 IMPLANT
GAUZE SPONGE 4X4 12PLY STRL (GAUZE/BANDAGES/DRESSINGS) ×3 IMPLANT
GAUZE STRETCH 2X75IN STRL (MISCELLANEOUS) IMPLANT
GAUZE XEROFORM 1X8 LF (GAUZE/BANDAGES/DRESSINGS) ×3 IMPLANT
GLOVE BIO SURGEON STRL SZ7.5 (GLOVE) ×3 IMPLANT
GLOVE BIOGEL PI IND STRL 7.0 (GLOVE) ×2 IMPLANT
GLOVE BIOGEL PI IND STRL 8 (GLOVE) ×2 IMPLANT
GLOVE BIOGEL PI INDICATOR 7.0 (GLOVE) ×2
GLOVE BIOGEL PI INDICATOR 8 (GLOVE) ×1
GLOVE ECLIPSE 6.5 STRL STRAW (GLOVE) ×2 IMPLANT
GOWN STRL REUS W/ TWL LRG LVL3 (GOWN DISPOSABLE) ×2 IMPLANT
GOWN STRL REUS W/TWL LRG LVL3 (GOWN DISPOSABLE) ×3
GOWN STRL REUS W/TWL XL LVL3 (GOWN DISPOSABLE) ×3 IMPLANT
NDL HYPO 25X1 1.5 SAFETY (NEEDLE) ×1 IMPLANT
NEEDLE HYPO 25X1 1.5 SAFETY (NEEDLE) ×3 IMPLANT
NS IRRIG 1000ML POUR BTL (IV SOLUTION) ×3 IMPLANT
PACK BASIN DAY SURGERY FS (CUSTOM PROCEDURE TRAY) ×3 IMPLANT
PAD CAST 3X4 CTTN HI CHSV (CAST SUPPLIES) IMPLANT
PAD CAST 4YDX4 CTTN HI CHSV (CAST SUPPLIES) IMPLANT
PADDING CAST COTTON 3X4 STRL (CAST SUPPLIES)
PADDING CAST COTTON 4X4 STRL (CAST SUPPLIES)
SPLINT FINGER 3.25 911903 (SOFTGOODS) ×2 IMPLANT
STOCKINETTE 4X48 STRL (DRAPES) ×3 IMPLANT
STRIP CLOSURE SKIN 1/2X4 (GAUZE/BANDAGES/DRESSINGS) IMPLANT
SUT ETHILON 3 0 PS 1 (SUTURE) IMPLANT
SUT ETHILON 4 0 PS 2 18 (SUTURE) ×3 IMPLANT
SYR BULB EAR ULCER 3OZ GRN STR (SYRINGE) ×3 IMPLANT
SYR CONTROL 10ML LL (SYRINGE) ×3 IMPLANT
TOWEL GREEN STERILE FF (TOWEL DISPOSABLE) ×6 IMPLANT
UNDERPAD 30X36 HEAVY ABSORB (UNDERPADS AND DIAPERS) ×3 IMPLANT

## 2022-06-03 NOTE — Anesthesia Procedure Notes (Signed)
Procedure Name: MAC Date/Time: 06/03/2022 1:40 PM  Performed by: Glory Buff, CRNAOxygen Delivery Method: Simple face mask

## 2022-06-03 NOTE — Anesthesia Preprocedure Evaluation (Addendum)
Anesthesia Evaluation  Patient identified by MRN, date of birth, ID band Patient awake    Reviewed: Allergy & Precautions, NPO status , Patient's Chart, lab work & pertinent test results  History of Anesthesia Complications Negative for: history of anesthetic complications  Airway Mallampati: II  TM Distance: >3 FB Neck ROM: Full    Dental  (+) Upper Dentures   Pulmonary neg pulmonary ROS,    Pulmonary exam normal        Cardiovascular hypertension, Normal cardiovascular exam     Neuro/Psych MS    GI/Hepatic negative GI ROS, Neg liver ROS,   Endo/Other  Hypothyroidism   Renal/GU negative Renal ROS  negative genitourinary   Musculoskeletal negative musculoskeletal ROS (+)   Abdominal   Peds  Hematology negative hematology ROS (+)   Anesthesia Other Findings   Reproductive/Obstetrics                             Anesthesia Physical Anesthesia Plan  ASA: 2  Anesthesia Plan: MAC and Bier Block and Bier Block-LIDOCAINE ONLY   Post-op Pain Management: Tylenol PO (pre-op)*   Induction: Intravenous  PONV Risk Score and Plan: 2 and Propofol infusion, TIVA and Treatment may vary due to age or medical condition  Airway Management Planned: Natural Airway, Nasal Cannula and Simple Face Mask  Additional Equipment: None  Intra-op Plan:   Post-operative Plan:   Informed Consent: I have reviewed the patients History and Physical, chart, labs and discussed the procedure including the risks, benefits and alternatives for the proposed anesthesia with the patient or authorized representative who has indicated his/her understanding and acceptance.       Plan Discussed with:   Anesthesia Plan Comments:         Anesthesia Quick Evaluation

## 2022-06-03 NOTE — H&P (Signed)
Susan Gomez is an 66 y.o. female.   Chief Complaint: trigger digit, mucoid cyst HPI: 66 yo female with triggering left thumb and cyst on dorsum of left thumb.  She wishes to have left thumb trigger release and excision of the cyst with debridement of the ip joint to try to prevent recurrence.  Allergies:  Allergies  Allergen Reactions   Codeine Other (See Comments)    Recovering addict - wishes not to consume if possible    Past Medical History:  Diagnosis Date   Encounter for long-term (current) use of other medications    High blood pressure    Hypothyroidism    MS (multiple sclerosis) ([facility])    Thyroid disease     Past Surgical History:  Procedure Laterality Date   KNEE SURGERY     LAPAROSCOPIC CHOLECYSTECTOMY SINGLE SITE WITH INTRAOPERATIVE CHOLANGIOGRAM N/A 12/02/2014   Procedure: LAPAROSCOPIC CHOLECYSTECTOMY SINGLE SITE WITH INTRAOPERATIVE CHOLANGIOGRAM;  Surgeon: Karie Soda, MD;  Location: [facility];  Service: General;  Laterality: N/A;   left thumb     VAGINAL HYSTERECTOMY      Family History: Family History  Problem Relation Age of Onset   Breast cancer Sister    Breast cancer Sister     Social History:   reports that she has never smoked. She has never used smokeless tobacco. She reports that she does not drink alcohol and does not use drugs.  Medications: Medications Prior to Admission  Medication Sig Dispense Refill   azelastine (ASTELIN) 0.1 % nasal spray Place 1 spray into both nostrils daily as needed for allergies.      Dimethyl Fumarate 240 MG CPDR Take 1 capsule (240 mg total) by mouth 2 (two) times daily. 60 capsule 11   gabapentin (NEURONTIN) 300 MG capsule Take 1 capsule (300 mg total) by mouth 2 (two) times daily as needed. 60 capsule 3   losartan-hydrochlorothiazide (HYZAAR) 100-12.5 MG per tablet Take 1 tablet by mouth daily.      SYNTHROID 112 MCG tablet Take 112 mcg by mouth daily before breakfast.      No results found for this or any  previous visit (from the past 48 hour(s)).  No results found.    Pulse (!) 50, temperature 97.7 F (36.5 C), temperature source Oral, resp. rate 15, height 5\' 3"  (1.6 m), weight 92.9 kg, SpO2 100 %.  General appearance: alert, cooperative, and appears stated age Head: Normocephalic, without obvious abnormality, atraumatic Neck: supple, symmetrical, trachea midline Extremities: Intact sensation and capillary refill all digits.  +epl/fpl/io.  No wounds.  Pulses: 2+ and symmetric Skin: Skin color, texture, turgor normal. No rashes or lesions Neurologic: Grossly normal Incision/Wound: none  Assessment/Plan Left thumb trigger digit and mucoid cyst with ip joint arthritis.  Plan trigger release and excision of cyst with debridement of ip joint and rotation flap as necessary.  Non operative and operative treatment options have been discussed with the patient and patient wishes to proceed with operative treatment. Risks, benefits, and alternatives of surgery have been discussed and the patient agrees with the plan of care.   06/03/2022, 12:25 PM

## 2022-06-03 NOTE — Anesthesia Procedure Notes (Signed)
Anesthesia Regional Block: Bier block (IV Regional)   Pre-Anesthetic Checklist: , timeout performed,  Correct Patient, Correct Site, Correct Laterality,  Correct Procedure, Correct Position, site marked,  Risks and benefits discussed,  Surgical consent,  Pre-op evaluation,  At surgeon's request  Laterality: Left  Prep: alcohol swabs        Procedures:,,,,, intact distal pulses, Esmarch exsanguination,  Single tourniquet utilized    Narrative:  Start time: 06/03/2022 1:46 PM End time: 06/03/2022 1:47 PM Injection made incrementally with aspirations every 30 mL. CRNA: Thornell Mule, CRNA

## 2022-06-03 NOTE — Op Note (Signed)
06/03/2022 [facility] OPERATIVE REPORT   PREOPERATIVE DIAGNOSIS: 1.  Left trigger thumb 2.  Left thumb mucoid cyst and IP joint arthritis  POSTOPERATIVE DIAGNOSIS:   1.  Left trigger thumb 2.  Left thumb mucoid cyst and IP joint arthritis  PROCEDURE: 1.  Left trigger thumb release 2.  Left thumb excision mucoid cyst via separate incision 3.  Left thumb debridement of IP joint  SURGEON:  Betha Loa, MD  ASSISTANT:   None .  ANESTHESIA:  Bier block with sedation.  IV FLUIDS:  Per anesthesia flow sheet.  ESTIMATED BLOOD LOSS:  Minimal.  COMPLICATIONS:  None.  SPECIMENS: Left thumb mucoid cyst to pathology  TOURNIQUET TIME:  Total Tourniquet Time Documented: Forearm (Left) - 36 minutes Total: Forearm (Left) - 36 minutes   DISPOSITION:  Stable to PACU.  LOCATION: [facility]  INDICATIONS: Susan Gomez is a 66 y.o. female with triggering of left thumb and mucoid cyst.  She wishes to have a left thumb trigger release and excision of mucoid cyst with debridement of IP joint to try to prevent recurrence.  Risks, benefits and alternatives of surgery were discussed including the risk of blood loss, infection, damage to nerves, vessels, tendons, ligaments, bone, failure of surgery, need for additional surgery, complications with wound healing, continued pain, continued triggering and need for repeat surgery.  She voiced understanding of these risks and elected to proceed.  OPERATIVE COURSE:  After being identified preoperatively by myself, the patient and I agreed upon the procedure and site of procedure.  The surgical site was marked. Surgical consent had been signed. She was given IV Ancef as preoperative antibiotic prophylaxis. She was transported to the operating room and placed on the operating room table in supine position with the left upper extremity on an arm board. Bier block anesthesia was induced by the anesthesiologist.  The left upper  extremity was prepped and draped in normal sterile orthopedic fashion. A surgical pause was performed between surgeons, anesthesia, and operating room staff, and all were in agreement as to the patient, procedure, and site of procedure.  Tourniquet at the proximal aspect of the forearm had been inflated for the Bier block.  An incision was made transversely at the MP flexion crease of the thumb.  This was made through the skin only.  Spreading technique was used.  The radial and ulnar digital nerves were identified and were protected throughout the case. The flexor sheath was identified.  The A1 pulley was identified.  It was sharply incised.  It was released in its entirety.  Care was taken to avoid any release of the oblique pulley. The thumb was placed through a range of motion, there was noted to be no catching.  The wound was copiously irrigated with sterile saline. It was then closed with 4-0 nylon in a horizontal mattress fashion.  A hockey-stick shaped incision was then made at the dorsum of the thumb adjacent to the cyst at the radial side of the IP joint.  This was carried in subcutaneous tissues by spreading technique.  The cyst was identified.  It was freed up using the scissors and removed with the synovectomy rongeurs.  The IP joint was entered underneath the extensor tendon and synovectomy performed with the synovectomy rongeurs.  There was a large osteophyte at the dorsum of the proximal phalanx.  This was taken down with the rongeurs.  There was also a osteophyte at the dorsum of the distal phalanx.  This was taken down as best possible while preserving the extensor tendon insertion.  The wound and joint were copiously irrigated with sterile saline.  The wound was closed with 4-0 nylon in a horizontal mattress fashion.  The cyst was sent to pathology for examination.  A digital block was performed with  0.25% plain Marcaine to aid in postoperative analgesia.  The wounds were then dressed with  sterile Xeroform, 4x4s, and wrapped lightly with a Coban dressing.  An AlumaFoam splint was placed and wrapped lightly with Coban dressing.  Tourniquet was deflated at 36 minutes.  The fingertips were pink with brisk capillary refill after deflation of the tourniquet.  The operative drapes were broken down and the patient was awoken from anesthesia safely.  She was transferred back to the stretcher and taken to the PACU in stable condition.   I will see her back in the office in 1 week for postoperative followup.  I will give her a prescription for Norco 5/325 1-2 tabs PO q6 hours prn pain, dispense # 20.    Betha Loa, MD Electronically signed, 06/03/22

## 2022-06-03 NOTE — Anesthesia Postprocedure Evaluation (Signed)
Anesthesia Post Note  Patient: Susan Gomez  Procedure(s) Performed: RELEASE TRIGGER FINGER/A-1 PULLEY LEFT THUMB (Left: Thumb) EXCISION MUCOID CYST (Left: Thumb) INTERPHALANGEAL JOINT ARTHROTOMY LEFT THUMB (Left: Thumb)     Patient location during evaluation: PACU Anesthesia Type: MAC Level of consciousness: awake and alert Pain management: pain level controlled Vital Signs Assessment: post-procedure vital signs reviewed and stable Respiratory status: spontaneous breathing, nonlabored ventilation and respiratory function stable Cardiovascular status: blood pressure returned to baseline and stable Postop Assessment: no apparent nausea or vomiting Anesthetic complications: no   No notable events documented.  Last Vitals:  Vitals:   06/03/22 1500 06/03/22 1515  BP:  (!) 141/93  Pulse:  (!) 53  Resp:  16  Temp:  36.6 C  SpO2: 96% 96%    Last Pain:  Vitals:   06/03/22 1515  TempSrc:   PainSc: 1                  Lidia Collum

## 2022-06-03 NOTE — Transfer of Care (Signed)
Immediate Anesthesia Transfer of Care Note  Patient: Susan Gomez  Procedure(s) Performed: RELEASE TRIGGER FINGER/A-1 PULLEY LEFT THUMB (Left: Thumb) EXCISION MUCOID CYST (Left: Thumb) INTERPHALANGEAL JOINT ARTHROTOMY LEFT THUMB (Left: Thumb)  Patient Location: PACU  Anesthesia Type:MAC and Bier block  Level of Consciousness: drowsy, patient cooperative and responds to stimulation  Airway & Oxygen Therapy: Patient Spontanous Breathing and Patient connected to face mask oxygen  Post-op Assessment: Report given to RN and Post -op Vital signs reviewed and stable  Post vital signs: Reviewed and stable  Last Vitals:  Vitals Value Taken Time  BP    Temp    Pulse 55 06/03/22 1430  Resp    SpO2 100 % 06/03/22 1430  Vitals shown include unvalidated device data.  Last Pain:  Vitals:   06/03/22 1159  TempSrc: Oral  PainSc: 0-No pain         Complications: No notable events documented.

## 2022-06-03 NOTE — Discharge Instructions (Addendum)
No tylenol until 6:00 p.m.  [facility] Center Instructions Hand Surgery  Wound Care: Keep your hand elevated above the level of your heart.  Do not allow it to dangle by your side.  Keep the dressing dry and do not remove it unless your doctor advises you to do so.  He will usually change it at the time of your post-op visit.  Moving your fingers is advised to stimulate circulation but will depend on the site of your surgery.  If you have a splint applied, your doctor will advise you regarding movement.  Activity: Do not drive or operate machinery today.  Rest today and then you may return to your normal activity and work as indicated by your physician.  Diet:  Drink liquids today or eat a light diet.  You may resume a regular diet tomorrow.    General expectations: Pain for two to three days. Fingers may become slightly swollen.  Call your doctor if any of the following occur: Severe pain not relieved by pain medication. Elevated temperature. Dressing soaked with blood. Inability to move fingers. White or bluish color to fingers.  Post Anesthesia Home Care Instructions  Activity: Get plenty of rest for the remainder of the day. A responsible individual must stay with you for 24 hours following the procedure.  For the next 24 hours, DO NOT: -Drive a car -Advertising copywriter -Drink alcoholic beverages -Take any medication unless instructed by your physician -Make any legal decisions or sign important papers.  Meals: Start with liquid foods such as gelatin or soup. Progress to regular foods as tolerated. Avoid greasy, spicy, heavy foods. If nausea and/or vomiting occur, drink only clear liquids until the nausea and/or vomiting subsides. Call your physician if vomiting continues.  Special Instructions/Symptoms: Your throat may feel dry or sore from the anesthesia or the breathing tube placed in your throat during surgery. If this causes discomfort, gargle with warm salt water. The  discomfort should disappear within 24 hours.  If you had a scopolamine patch placed behind your ear for the management of post- operative nausea and/or vomiting:  1. The medication in the patch is effective for 72 hours, after which it should be removed.  Wrap patch in a tissue and discard in the trash. Wash hands thoroughly with soap and water. 2. You may remove the patch earlier than 72 hours if you experience unpleasant side effects which may include dry mouth, dizziness or visual disturbances. 3. Avoid touching the patch. Wash your hands with soap and water after contact with the patch.

## 2022-06-04 ENCOUNTER — Encounter (HOSPITAL_BASED_OUTPATIENT_CLINIC_OR_DEPARTMENT_OTHER): Payer: Self-pay | Admitting: Orthopedic Surgery

## 2022-06-04 LAB — SURGICAL PATHOLOGY

## 2022-09-02 ENCOUNTER — Telehealth: Payer: Self-pay | Admitting: Neurology

## 2022-09-02 NOTE — Telephone Encounter (Signed)
LVM and sent mychart msg informing pt of need to reschedule 12/28 appointment - NP out 

## 2022-09-10 DIAGNOSIS — E7800 Pure hypercholesterolemia, unspecified: Secondary | ICD-10-CM | POA: Diagnosis not present

## 2022-09-10 DIAGNOSIS — I10 Essential (primary) hypertension: Secondary | ICD-10-CM | POA: Diagnosis not present

## 2022-09-10 DIAGNOSIS — G35 Multiple sclerosis: Secondary | ICD-10-CM | POA: Diagnosis not present

## 2022-09-10 DIAGNOSIS — E2839 Other primary ovarian failure: Secondary | ICD-10-CM | POA: Diagnosis not present

## 2022-09-10 DIAGNOSIS — Z0000 Encounter for general adult medical examination without abnormal findings: Secondary | ICD-10-CM | POA: Diagnosis not present

## 2022-09-10 DIAGNOSIS — E039 Hypothyroidism, unspecified: Secondary | ICD-10-CM | POA: Diagnosis not present

## 2022-10-08 DIAGNOSIS — J40 Bronchitis, not specified as acute or chronic: Secondary | ICD-10-CM | POA: Diagnosis not present

## 2022-10-08 DIAGNOSIS — R059 Cough, unspecified: Secondary | ICD-10-CM | POA: Diagnosis not present

## 2022-10-08 DIAGNOSIS — R0981 Nasal congestion: Secondary | ICD-10-CM | POA: Diagnosis not present

## 2022-10-08 DIAGNOSIS — J329 Chronic sinusitis, unspecified: Secondary | ICD-10-CM | POA: Diagnosis not present

## 2022-10-30 ENCOUNTER — Ambulatory Visit: Payer: Medicare HMO | Admitting: Neurology

## 2022-11-10 ENCOUNTER — Other Ambulatory Visit: Payer: Self-pay | Admitting: Family Medicine

## 2022-11-10 DIAGNOSIS — E2839 Other primary ovarian failure: Secondary | ICD-10-CM

## 2022-11-13 ENCOUNTER — Encounter (INDEPENDENT_AMBULATORY_CARE_PROVIDER_SITE_OTHER): Payer: Medicare HMO | Admitting: Ophthalmology

## 2022-11-13 DIAGNOSIS — H2513 Age-related nuclear cataract, bilateral: Secondary | ICD-10-CM | POA: Diagnosis not present

## 2022-11-13 DIAGNOSIS — H353121 Nonexudative age-related macular degeneration, left eye, early dry stage: Secondary | ICD-10-CM | POA: Diagnosis not present

## 2022-11-13 DIAGNOSIS — H353212 Exudative age-related macular degeneration, right eye, with inactive choroidal neovascularization: Secondary | ICD-10-CM | POA: Diagnosis not present

## 2022-11-13 DIAGNOSIS — H43813 Vitreous degeneration, bilateral: Secondary | ICD-10-CM | POA: Diagnosis not present

## 2022-12-23 DIAGNOSIS — T7840XA Allergy, unspecified, initial encounter: Secondary | ICD-10-CM | POA: Diagnosis not present

## 2022-12-23 DIAGNOSIS — H6123 Impacted cerumen, bilateral: Secondary | ICD-10-CM | POA: Diagnosis not present

## 2022-12-23 DIAGNOSIS — H9203 Otalgia, bilateral: Secondary | ICD-10-CM | POA: Diagnosis not present

## 2023-01-14 NOTE — Progress Notes (Unsigned)
New Patient Note  RE: Susan Gomez MRN: [id number] DOB: Apr 19, 1956 Date of Office Visit: 01/15/2023  Consult requested by: [name], MD Primary care provider: [name], MD  Chief Complaint: Urticaria (Has been having hives for about 2-3 weeks. )  History of Present Illness: I had the pleasure of seeing Susan Gomez for initial evaluation at the [facility] on 01/15/2023. She is a 67 y.o. female, who is referred here by [name], MD for the evaluation of hives.  Rash started about a few months ago. This can occur anywhere on her body. Describes them as itchy, red and raised. Individual rashes lasts about a few hours. No ecchymosis upon resolution. Associated symptoms include: none.  Frequency of episodes: daily - mostly at night. Suspected triggers are unknown. Denies any fevers, chills, changes in medications, foods, personal care products or recent infections. She has tried the following therapies: benadryl with good benefit. Systemic steroids yes. Currently only taking benadryl QHS.  Previous work up includes: none. Previous history of rash/hives: patient broke out hives in 1994 and was given benadryl injections. Patient is up to date with the following cancer screening tests: physical exam coming up, mammogram, colonoscopy.  Reviewed images on the phone - consistent with urticarial rash.   Assessment and Plan: Susan Gomez is a 67 y.o. female with: Chronic urticaria Broke out in hives a few months ago which resolved with steroids and then flared again a few weeks ago. Taking benadryl prn with some benefit. Denies changes in diet, meds, personal care products or recent infections. Had hives back in 1994 once with no triggers. Based on clinical history, she likely has chronic idiopathic urticaria. Discussed with patient, that urticaria is usually caused by release of histamine by cutaneous mast cells but sometimes it is non-histamine mediated.  Explained that urticaria is not always associated with allergies. In most cases, the exact etiology for urticaria can not be established and it is considered idiopathic. Start zyrtec (cetirizine) '10mg'$  twice a day OR Allegra (fexofenadine) '180mg'$  twice a day. You can take the generic versions.  If symptoms are not controlled or causes drowsiness let us know. If you are still breaking out with the above medications then: Start Pepcid (famotidine) '20mg'$  twice a day.  Avoid the following potential triggers: alcohol, tight clothing, NSAIDs, hot showers and getting overheated. See below for proper skin care.  Get bloodwork to rule out other etiologies.  Chronic rhinitis Some rhinitis symptoms in the spring and takes azelastine prn with good benefit. Unable to skin test today due to recent antihistamine intake. Monitor symptoms and if worsening then recommend environmental allergy testing in future.  Elevated blood pressure reading Takes medication for this. Monitor at home. Follow up with PCP if still high.  Return in about 2 months (around 03/17/2023).  Meds ordered this encounter  Medications   famotidine (PEPCID) 20 MG tablet    Sig: Take 1 tablet (20 mg total) by mouth 2 (two) times daily.    Dispense:  60 tablet    Refill:  3   Lab Orders         C3 and C4         CBC with Differential/Platelet         Chronic Urticaria         ANA w/Reflex         Comprehensive metabolic panel         C-reactive protein  Tryptase         Thyroid Cascade Profile         Sedimentation rate         Alpha-Gal Panel         Protein Electrophoresis, Urine Rflx.         Protein electrophoresis, serum      Other allergy screening: Asthma: no Rhino conjunctivitis: yes Usually has sinus issues during the spring months and takes azelastine nasal spray prn with good benefit.   Food allergy: no Medication allergy: no Hymenoptera allergy: no Eczema:no History of recurrent infections  suggestive of immunodeficency: no  Diagnostics: Skin Testing: Deferred due to recent antihistamines use.  Past Medical History: Patient Active Problem List   Diagnosis Date Noted   Chronic urticaria 01/15/2023   Elevated blood pressure reading 01/15/2023   Chronic rhinitis 01/15/2023   Pain in finger of left hand 09/20/2020   Exudative age-related macular degeneration of right eye with inactive choroidal neovascularization ([place]) 05/14/2020   Posterior vitreous detachment of both eyes 05/14/2020   Nuclear sclerotic cataract of both eyes 05/14/2020   Chronic right-sided low back pain with right-sided sciatica 01/18/2019   Abnormality of gait 04/16/2017   Therapeutic drug monitoring 04/16/2017   Abnormal finding on MRI of brain 10/10/2015   Low back pain 10/10/2015   Acute cholecystitis 12/03/2014   Elevated LFTs, probable CBD stone 12/02/2014   Calculus of gallbladder with biliary obstruction but without cholecystitis    Hypokalemia    Essential hypertension 12/01/2014   Hypothyroid 12/01/2014   Cholelithiasis 12/01/2014   Relapsing remitting multiple sclerosis ([place]) 09/23/2013   HEMORRHOIDS-EXTERNAL 05/15/2009   CONSTIPATION 05/15/2009   ANAL OR RECTAL PAIN 05/15/2009   ABDOMINAL PAIN-LLQ 05/15/2009   PERSONAL HX COLONIC POLYPS 05/15/2009   ANAL FISSURE 04/24/2009   RECTAL BLEEDING 04/24/2009   HEARTBURN 04/24/2009   CHANGE IN BOWELS 04/24/2009   Past Medical History:  Diagnosis Date   Encounter for long-term (current) use of other medications    High blood pressure    Hypothyroidism    MS (multiple sclerosis) ([place])    Thyroid disease    Urticaria    Past Surgical History: Past Surgical History:  Procedure Laterality Date   CYST EXCISION Left 06/03/2022   Procedure: EXCISION MUCOID CYST;  Surgeon: Leanora Cover, MD;  Location: [facility];  Service: Orthopedics;  Laterality: Left;   KNEE SURGERY     LAPAROSCOPIC CHOLECYSTECTOMY SINGLE SITE WITH  INTRAOPERATIVE CHOLANGIOGRAM N/A 12/02/2014   Procedure: LAPAROSCOPIC CHOLECYSTECTOMY SINGLE SITE WITH INTRAOPERATIVE CHOLANGIOGRAM;  Surgeon: Michael Boston, MD;  Location: [facility];  Service: General;  Laterality: N/A;   left thumb     TENDON EXPLORATION Left 06/03/2022   Procedure: INTERPHALANGEAL JOINT ARTHROTOMY LEFT THUMB;  Surgeon: Leanora Cover, MD;  Location: [facility];  Service: Orthopedics;  Laterality: Left;   TRIGGER FINGER RELEASE Left 06/03/2022   Procedure: RELEASE TRIGGER FINGER/A-1 PULLEY LEFT THUMB;  Surgeon: Leanora Cover, MD;  Location: [facility];  Service: Orthopedics;  Laterality: Left;   TYMPANOSTOMY TUBE PLACEMENT     VAGINAL HYSTERECTOMY     Medication List:  Current Outpatient Medications  Medication Sig Dispense Refill   azelastine (ASTELIN) 0.1 % nasal spray Place 1 spray into both nostrils daily as needed for allergies.      Dimethyl Fumarate 240 MG CPDR Take 1 capsule (240 mg total) by mouth 2 (two) times daily. 60 capsule 11   famotidine (PEPCID) 20 MG tablet Take  1 tablet (20 mg total) by mouth 2 (two) times daily. 60 tablet 3   gabapentin (NEURONTIN) 300 MG capsule Take 1 capsule (300 mg total) by mouth 2 (two) times daily as needed. 60 capsule 3   losartan-hydrochlorothiazide (HYZAAR) 100-12.5 MG per tablet Take 1 tablet by mouth daily.      SYNTHROID 112 MCG tablet Take 112 mcg by mouth daily before breakfast.     No current facility-administered medications for this visit.   Allergies: Allergies  Allergen Reactions   Codeine Other (See Comments)    Recovering addict - wishes not to consume if possible   Social History: Social History   Socioeconomic History   Marital status: Married    Spouse name: [name]   Number of children: 3   Years of education: 15+   Highest education level: Not on file  Occupational History    Comment: Disabled  Tobacco Use   Smoking status: Never   Smokeless tobacco: Never  Vaping Use    Vaping Use: Never used  Substance and Sexual Activity   Alcohol use: No    Alcohol/week: 0.0 standard drinks of alcohol   Drug use: No    Comment: recovering addict   Sexual activity: Not on file  Other Topics Concern   Not on file  Social History Narrative   Patient lives at home with her husband Susan Gomez).   Patient has 3 children and husbands has 4 children.   Patient has college education.   Patient is left-handed.   Patient drinks one cup of coffee in the am and two cups at night.      Social Determinants of Health   Financial Resource Strain: Not on file  Food Insecurity: Not on file  Transportation Needs: Not on file  Physical Activity: Not on file  Stress: Not on file  Social Connections: Not on file   Lives in a house which is 65 years old. Smoking: quit in 1994 Occupation: volunteer  Environmental History: Water Damage/mildew in the house: no Charity fundraiser in the family room: yes Carpet in the bedroom: yes Heating: gas Cooling: central Pet: no  Family History: Family History  Problem Relation Age of Onset   Breast cancer Sister    Breast cancer Sister    Allergic rhinitis Neg Hx    Angioedema Neg Hx    Asthma Neg Hx    Eczema Neg Hx    Urticaria Neg Hx    Review of Systems  Constitutional:  Negative for appetite change, chills, fever and unexpected weight change.  HENT:  Negative for congestion and rhinorrhea.   Eyes:  Negative for itching.  Respiratory:  Negative for cough, chest tightness, shortness of breath and wheezing.   Cardiovascular:  Negative for chest pain.  Gastrointestinal:  Negative for abdominal pain.  Genitourinary:  Negative for difficulty urinating.  Skin:  Positive for rash.  Neurological:  Negative for headaches.    Objective: BP (!) 150/90   Pulse (!) 57   Temp 97.6 F (36.4 C)   Resp 16   Ht 5' 4.57" (1.64 m)   Wt 209 lb 8 oz (95 kg)   SpO2 99%   BMI 35.33 kg/m  Body mass index is 35.33 kg/m. Physical Exam Vitals and  nursing note reviewed.  Constitutional:      Appearance: Normal appearance. She is well-developed.  HENT:     Head: Normocephalic and atraumatic.     Right Ear: Tympanic membrane and external ear normal.  Left Ear: Tympanic membrane and external ear normal.     Nose: Nose normal.     Mouth/Throat:     Mouth: Mucous membranes are moist.     Pharynx: Oropharynx is clear.  Eyes:     Conjunctiva/sclera: Conjunctivae normal.  Cardiovascular:     Rate and Rhythm: Normal rate and regular rhythm.     Heart sounds: Normal heart sounds. No murmur heard.    No friction rub. No gallop.  Pulmonary:     Effort: Pulmonary effort is normal.     Breath sounds: Normal breath sounds. No wheezing, rhonchi or rales.  Musculoskeletal:     Cervical back: Neck supple.  Skin:    General: Skin is warm.     Findings: No rash.  Neurological:     Mental Status: She is alert and oriented to person, place, and time.  Psychiatric:        Behavior: Behavior normal.   The plan was reviewed with the patient/family, and all questions/concerned were addressed.  It was my pleasure to see Susan Gomez today and participate in her care. Please feel free to contact me with any questions or concerns.  Sincerely,  Rexene Alberts, DO Allergy & Immunology  [facility] office: [place] office: [phone]

## 2023-01-15 ENCOUNTER — Encounter: Payer: Self-pay | Admitting: Allergy

## 2023-01-15 ENCOUNTER — Ambulatory Visit: Payer: Medicare HMO | Admitting: Allergy

## 2023-01-15 ENCOUNTER — Other Ambulatory Visit: Payer: Self-pay

## 2023-01-15 VITALS — BP 150/90 | HR 57 | Temp 97.6°F | Resp 16 | Ht 64.57 in | Wt 209.5 lb

## 2023-01-15 DIAGNOSIS — R030 Elevated blood-pressure reading, without diagnosis of hypertension: Secondary | ICD-10-CM | POA: Diagnosis not present

## 2023-01-15 DIAGNOSIS — R21 Rash and other nonspecific skin eruption: Secondary | ICD-10-CM

## 2023-01-15 DIAGNOSIS — J310 Chronic rhinitis: Secondary | ICD-10-CM

## 2023-01-15 DIAGNOSIS — L299 Pruritus, unspecified: Secondary | ICD-10-CM | POA: Diagnosis not present

## 2023-01-15 DIAGNOSIS — L508 Other urticaria: Secondary | ICD-10-CM | POA: Diagnosis not present

## 2023-01-15 MED ORDER — FAMOTIDINE 20 MG PO TABS: 20.0000 mg | ORAL_TABLET | Freq: Two times a day (BID) | ORAL | 3 refills | Status: DC

## 2023-01-15 NOTE — Patient Instructions (Addendum)
Hives: Based on clinical history, she likely has chronic idiopathic urticaria. Discussed with patient, that urticaria is usually caused by release of histamine by cutaneous mast cells but sometimes it is non-histamine mediated. Explained that urticaria is not always associated with allergies. In most cases, the exact etiology for urticaria can not be established and it is considered idiopathic.  Start zyrtec (cetirizine) '10mg'$  twice a day OR Allegra (fexofenadine) '180mg'$  twice a day. You can take the generic versions.  If symptoms are not controlled or causes drowsiness let us know. If you are still breaking out with the above medications then: Start pepcid (famotidine) '20mg'$  twice a day.  Avoid the following potential triggers: alcohol, tight clothing, NSAIDs, hot showers and getting overheated. See below for proper skin care.   Get bloodwork:  We are ordering labs, so please allow 1-2 weeks for the results to come back. With the newly implemented Cures Act, the labs might be visible to you at the same time that they become visible to me. However, I will not address the results until all of the results are back, so please be patient.   Elevated blood pressure Monitor at home. Follow up with PCP if still high.  Follow up in 1-2 months or sooner if needed.  Skin care recommendations  Bath time: Always use lukewarm water. AVOID very hot or cold water. Keep bathing time to 5-10 minutes. Do NOT use bubble bath. Use a mild soap and use just enough to wash the dirty areas. Do NOT scrub skin vigorously.  After bathing, pat dry your skin with a towel. Do NOT rub or scrub the skin.  Moisturizers and prescriptions:  ALWAYS apply moisturizers immediately after bathing (within 3 minutes). This helps to lock-in moisture. Use the moisturizer several times a day over the whole body. Good summer moisturizers include: Aveeno, CeraVe, Cetaphil. Good winter moisturizers include: Aquaphor, Vaseline,  Cerave, Cetaphil, Eucerin, Vanicream. When using moisturizers along with medications, the moisturizer should be applied about one hour after applying the medication to prevent diluting effect of the medication or moisturize around where you applied the medications. When not using medications, the moisturizer can be continued twice daily as maintenance.  Laundry and clothing: Avoid laundry products with added color or perfumes. Use unscented hypo-allergenic laundry products such as Tide free, Cheer free & gentle, and All free and clear.  If the skin still seems dry or sensitive, you can try double-rinsing the clothes. Avoid tight or scratchy clothing such as wool. Do not use fabric softeners or dyer sheets.

## 2023-01-15 NOTE — Assessment & Plan Note (Signed)
Broke out in hives a few months ago which resolved with steroids and then flared again a few weeks ago. Taking benadryl prn with some benefit. Denies changes in diet, meds, personal care products or recent infections. Had hives back in 1994 once with no triggers. Based on clinical history, she likely has chronic idiopathic urticaria. Discussed with patient, that urticaria is usually caused by release of histamine by cutaneous mast cells but sometimes it is non-histamine mediated. Explained that urticaria is not always associated with allergies. In most cases, the exact etiology for urticaria can not be established and it is considered idiopathic. Start zyrtec (cetirizine) '10mg'$  twice a day OR Allegra (fexofenadine) '180mg'$  twice a day. You can take the generic versions.  If symptoms are not controlled or causes drowsiness let us know. If you are still breaking out with the above medications then: Start Pepcid (famotidine) '20mg'$  twice a day.  Avoid the following potential triggers: alcohol, tight clothing, NSAIDs, hot showers and getting overheated. See below for proper skin care.  Get bloodwork to rule out other etiologies.

## 2023-01-15 NOTE — Assessment & Plan Note (Addendum)
Some rhinitis symptoms in the spring and takes azelastine prn with good benefit. Unable to skin test today due to recent antihistamine intake. Monitor symptoms and if worsening then recommend environmental allergy testing in future.

## 2023-01-15 NOTE — Assessment & Plan Note (Signed)
Takes medication for this. Monitor at home. Follow up with PCP if still high.

## 2023-01-23 LAB — COMPREHENSIVE METABOLIC PANEL
ALT: 14 IU/L (ref 0–32)
AST: 18 IU/L (ref 0–40)
Albumin/Globulin Ratio: 1.6 (ref 1.2–2.2)
Albumin: 4.3 g/dL (ref 3.9–4.9)
Alkaline Phosphatase: 50 IU/L (ref 44–121)
BUN/Creatinine Ratio: 9 — ABNORMAL LOW (ref 12–28)
BUN: 9 mg/dL (ref 8–27)
Bilirubin Total: 0.8 mg/dL (ref 0.0–1.2)
CO2: 23 mmol/L (ref 20–29)
Calcium: 9.6 mg/dL (ref 8.7–10.3)
Chloride: 102 mmol/L (ref 96–106)
Creatinine, Ser: 1.04 mg/dL — ABNORMAL HIGH (ref 0.57–1.00)
Globulin, Total: 2.7 g/dL (ref 1.5–4.5)
Glucose: 91 mg/dL (ref 70–99)
Potassium: 3.9 mmol/L (ref 3.5–5.2)
Sodium: 141 mmol/L (ref 134–144)
Total Protein: 7 g/dL (ref 6.0–8.5)
eGFR: 60 mL/min/{1.73_m2} (ref >59)

## 2023-01-23 LAB — PROTEIN ELECTROPHORESIS, URINE REFLEX
Albumin ELP, Urine: 24.7 %
Alpha-1-Globulin, U: 5.7 %
Alpha-2-Globulin, U: 13.3 %
Beta Globulin, U: 32.8 %
Gamma Globulin, U: 23.5 %
Protein, Ur: 16.7 mg/dL

## 2023-01-23 LAB — PROTEIN ELECTROPHORESIS, SERUM
A/G Ratio: 1.1 (ref 0.7–1.7)
Albumin ELP: 3.6 g/dL (ref 2.9–4.4)
Alpha 1: 0.3 g/dL (ref 0.0–0.4)
Alpha 2: 0.8 g/dL (ref 0.4–1.0)
Beta: 1.1 g/dL (ref 0.7–1.3)
Gamma Globulin: 1.3 g/dL (ref 0.4–1.8)
Globulin, Total: 3.4 g/dL (ref 2.2–3.9)

## 2023-01-23 LAB — CBC WITH DIFFERENTIAL/PLATELET
Basophils Absolute: 0.1 10*3/uL (ref 0.0–0.2)
Basos: 1 %
EOS (ABSOLUTE): 0.1 10*3/uL (ref 0.0–0.4)
Eos: 1 %
Hematocrit: 37.7 % (ref 34.0–46.6)
Hemoglobin: 12.6 g/dL (ref 11.1–15.9)
Immature Grans (Abs): 0 10*3/uL (ref 0.0–0.1)
Immature Granulocytes: 0 %
Lymphocytes Absolute: 2.4 10*3/uL (ref 0.7–3.1)
Lymphs: 37 %
MCH: 30.1 pg (ref 26.6–33.0)
MCHC: 33.4 g/dL (ref 31.5–35.7)
MCV: 90 fL (ref 79–97)
Monocytes Absolute: 0.5 10*3/uL (ref 0.1–0.9)
Monocytes: 8 %
Neutrophils Absolute: 3.5 10*3/uL (ref 1.4–7.0)
Neutrophils: 53 %
Platelets: 223 10*3/uL (ref 150–450)
RBC: 4.18 x10E6/uL (ref 3.77–5.28)
RDW: 13.3 % (ref 11.7–15.4)
WBC: 6.5 10*3/uL (ref 3.4–10.8)

## 2023-01-23 LAB — ALPHA-GAL PANEL
Allergen Lamb IgE: <0.1 kU/L
Beef IgE: 0.13 kU/L — AB
IgE (Immunoglobulin E), Serum: 239 IU/mL (ref 6–495)
O215-IgE Alpha-Gal: <0.1 kU/L
Pork IgE: <0.1 kU/L

## 2023-01-23 LAB — TRYPTASE: Tryptase: 4.7 ug/L (ref 2.2–13.2)

## 2023-01-23 LAB — SEDIMENTATION RATE: Sed Rate: 89 mm/hr — ABNORMAL HIGH (ref 0–40)

## 2023-01-23 LAB — C-REACTIVE PROTEIN: CRP: 11 mg/L — ABNORMAL HIGH (ref 0–10)

## 2023-01-23 LAB — C3 AND C4
Complement C3, Serum: 151 mg/dL (ref 82–167)
Complement C4, Serum: 32 mg/dL (ref 12–38)

## 2023-01-23 LAB — ANA W/REFLEX: Anti Nuclear Antibody (ANA): NEGATIVE

## 2023-01-23 LAB — CHRONIC URTICARIA: cu index: <4.6 (ref <10)

## 2023-01-23 LAB — THYROXINE (T4) FREE, DIRECT: T4,Free (Direct): 1.87 ng/dL — ABNORMAL HIGH (ref 0.82–1.77)

## 2023-01-23 LAB — THYROID CASCADE PROFILE: TSH: 0.353 u[IU]/mL — ABNORMAL LOW (ref 0.450–4.500)

## 2023-01-26 ENCOUNTER — Other Ambulatory Visit: Payer: Self-pay | Admitting: Family Medicine

## 2023-01-26 DIAGNOSIS — Z1231 Encounter for screening mammogram for malignant neoplasm of breast: Secondary | ICD-10-CM

## 2023-01-26 NOTE — Progress Notes (Signed)
Please CALL patient. I reviewed the bloodwork. Blood count, kidney function, liver function, electrolytes, autoimmune screener, chronic urticaria index (checks for autoantibodies that trigger mast cells), tryptase (checks for mast cell issues) and alpha gal (checks for red meat allergy), urine test were all normal which is great.   Your thyroid hormone level was low. Please follow up with your PCP - may need adjustment in your synthroid medication.   Your sed rate - which is a nonspecific inflammation marker was elevated. This may be due to your recent steroid medication. The rest of your inflammation markers were normal which is great. Keep April appointment and will recheck this level.   Continue medications as discussed at the last visit.

## 2023-01-29 ENCOUNTER — Ambulatory Visit
Admission: RE | Admit: 2023-01-29 | Discharge: 2023-01-29 | Disposition: A | Discharge status: Home / Self Care | Payer: Medicare HMO | Source: Ambulatory Visit | Attending: Family Medicine | Admitting: Family Medicine

## 2023-01-29 DIAGNOSIS — Z1231 Encounter for screening mammogram for malignant neoplasm of breast: Secondary | ICD-10-CM

## 2023-02-12 ENCOUNTER — Ambulatory Visit: Payer: Medicare HMO | Admitting: Allergy

## 2023-02-16 NOTE — Progress Notes (Unsigned)
Follow Up Note  RE: Susan Gomez MRN: [id number] DOB: [birth year] Date of Office Visit: 02/17/2023  Referring provider: [name], MD Primary care provider: [name], MD  Chief Complaint: No chief complaint on file.  History of Present Illness: I had the pleasure of seeing Susan Gomez for a follow up visit at the [facility] on 02/16/2023. She is a 67 y.o. female, who is being followed for chronic urticaria, chronic rhinitis. Her previous allergy office visit was on 01/15/2023 with Dr. Selena Batten. Today is a regular follow up visit.  I reviewed the bloodwork. Blood count, kidney function, liver function, electrolytes, autoimmune screener, chronic urticaria index (checks for autoantibodies that trigger mast cells), tryptase (checks for mast cell issues) and alpha gal (checks for red meat allergy), urine test were all normal which is great.    Your thyroid hormone level was low. Please follow up with your PCP - may need adjustment in your synthroid medication.    Your sed rate - which is a nonspecific inflammation marker was elevated. This may be due to your recent steroid medication. The rest of your inflammation markers were normal which is great. Keep April appointment and will recheck this level.    Continue medications as discussed at the last visit.  Chronic urticaria Broke out in hives a few months ago which resolved with steroids and then flared again a few weeks ago. Taking benadryl prn with some benefit. Denies changes in diet, meds, personal care products or recent infections. Had hives back in 1994 once with no triggers. Based on clinical history, she likely has chronic idiopathic urticaria. Discussed with patient, that urticaria is usually caused by release of histamine by cutaneous mast cells but sometimes it is non-histamine mediated. Explained that urticaria is not always associated with allergies. In most cases, the exact etiology for urticaria  can not be established and it is considered idiopathic. Start zyrtec (cetirizine)  twice a day OR Allegra (fexofenadine)  twice a day. You can take the generic versions.  If symptoms are not controlled or causes drowsiness let us know. If you are still breaking out with the above medications then: Start Pepcid (famotidine)  twice a day.  Avoid the following potential triggers: alcohol, tight clothing, NSAIDs, hot showers and getting overheated. See below for proper skin care.  Get bloodwork to rule out other etiologies.   Chronic rhinitis Some rhinitis symptoms in the spring and takes azelastine prn with good benefit. Unable to skin test today due to recent antihistamine intake. Monitor symptoms and if worsening then recommend environmental allergy testing in future.   Elevated blood pressure reading Takes medication for this. Monitor at home. Follow up with PCP if still high.    Assessment and Plan: Susan Gomez is a 67 y.o. female with: No problem-specific Assessment & Plan notes found for this encounter.  No follow-ups on file.  No orders of the defined types were placed in this encounter.  Lab Orders  No laboratory test(s) ordered today    Diagnostics: Spirometry:  Tracings reviewed. Her effort: {Blank single:19197::"Good reproducible efforts.","It was hard to get consistent efforts and there is a question as to whether this reflects a maximal maneuver.","Poor effort, data can not be interpreted."} FVC: ***L FEV1: ***L, ***% predicted FEV1/FVC ratio: ***% Interpretation: {Blank single:19197::"Spirometry consistent with mild obstructive disease","Spirometry consistent with moderate obstructive disease","SpirometrSALOMA CADENAwith severe obstructive disease","Spirometry consistent with possible restrictive disease","Spirometry consistent with mixed obstructive and restrictive disease","Spirometry uninterpretable due to technique","Spirometry consistent  with normal  pattern","No overt abnormalities noted given today's efforts"}.  Please see scanned spirometry results for details.  Skin Testing: {Blank single:19197::"Select foods","Environmental allergy panel","Environmental allergy panel and select foods","Food allergy panel","None","Deferred due to recent antihistamines use"}. *** Results discussed with patient/family.   Medication List:  Current Outpatient Medications  Medication Sig Dispense Refill   azelastine (ASTELIN) 0.1 % nasal spray Place 1 spray into both nostrils daily as needed for allergies.      Dimethyl Fumarate 240 MG CPDR Take 1 capsule (240 mg total) by mouth 2 (two) times daily. 60 capsule 11   famotidine (PEPCID) 20 MG tablet Take 1 tablet (20 mg total) by mouth 2 (two) times daily. 60 tablet 3   gabapentin (NEURONTIN) 300 MG capsule Take 1 capsule (300 mg total) by mouth 2 (two) times daily as needed. 60 capsule 3   losartan-hydrochlorothiazide (HYZAAR) 100-12.5 MG per tablet Take 1 tablet by mouth daily.      SYNTHROID 112 MCG tablet Take 112 mcg by mouth daily before breakfast.     No current facility-administered medications for this visit.   Allergies: Allergies  Allergen Reactions   Codeine Other (See Comments)    Recovering addict - wishes not to consume if possible   I reviewed her past medical history, social history, family history, and environmental history and no significant changes have been reported from her previous visit.  Review of Systems  Constitutional:  Negative for appetite change, chills, fever and unexpected weight change.  HENT:  Negative for congestion and rhinorrhea.   Eyes:  Negative for itching.  Respiratory:  Negative for cough, chest tightness, shortness of breath and wheezing.   Cardiovascular:  Negative for chest pain.  Gastrointestinal:  Negative for abdominal pain.  Genitourinary:  Negative for difficulty urinating.  Skin:  Positive for rash.  Neurological:  Negative for headaches.     Objective: There were no vitals taken for this visit. There is no height or weight on file to calculate BMI. Physical Exam Vitals and nursing note reviewed.  Constitutional:      Appearance: Normal appearance. She is well-developed.  HENT:     Head: Normocephalic and atraumatic.     Right Ear: Tympanic membrane and external ear normal.     Left Ear: Tympanic membrane and external ear normal.     Nose: Nose normal.     Mouth/Throat:     Mouth: Mucous membranes are moist.     Pharynx: Oropharynx is clear.  Eyes:     Conjunctiva/sclera: Conjunctivae normal.  Cardiovascular:     Rate and Rhythm: Normal rate and regular rhythm.     Heart sounds: Normal heart sounds. No murmur heard.    No friction rub. No gallop.  Pulmonary:     Effort: Pulmonary effort is normal.     Breath sounds: Normal breath sounds. No wheezing, rhonchi or rales.  Musculoskeletal:     Cervical back: Neck supple.  Skin:    General: Skin is warm.     Findings: No rash.  Neurological:     Mental Status: She is alert and oriented to person, place, and time.  Psychiatric:        Behavior: Behavior normal.    Previous notes and tests were reviewed. The plan was reviewed with the patient/family, and all questions/concerned were addressed.  It was my pleasure to see Keiana today and participate in her care. Please feel free to contact me with any questions or concerns.  Sincerely,  Wyline Mood,  DO Allergy & Immunology  [facility] office: [place] office: [phone]

## 2023-02-17 ENCOUNTER — Other Ambulatory Visit: Payer: Self-pay

## 2023-02-17 ENCOUNTER — Ambulatory Visit: Payer: Medicare HMO | Admitting: Allergy

## 2023-02-17 ENCOUNTER — Encounter: Payer: Self-pay | Admitting: Allergy

## 2023-02-17 VITALS — BP 150/90 | HR 58 | Temp 97.7°F | Resp 18 | Wt 208.5 lb

## 2023-02-17 DIAGNOSIS — L508 Other urticaria: Secondary | ICD-10-CM | POA: Diagnosis not present

## 2023-02-17 DIAGNOSIS — J310 Chronic rhinitis: Secondary | ICD-10-CM

## 2023-02-17 DIAGNOSIS — R030 Elevated blood-pressure reading, without diagnosis of hypertension: Secondary | ICD-10-CM

## 2023-02-17 NOTE — Patient Instructions (Addendum)
Hives: Based on clinical history, she likely has chronic idiopathic urticaria. Discussed with patient, that urticaria is usually caused by release of histamine by cutaneous mast cells but sometimes it is non-histamine mediated. Explained that urticaria is not always associated with allergies. In most cases, the exact etiology for urticaria can not be established and it is considered idiopathic.  Continue Allegra (fexofenadine) 180mg  twice a day. You can take the generic versions.  If symptoms are not controlled or causes drowsiness let us know. Start pepcid (famotidine) 20mg  twice a day.  Avoid the following potential triggers: alcohol, tight clothing, NSAIDs, hot showers and getting overheated. See below for proper skin care.   Get bloodwork:  We are ordering labs, so please allow 1-2 weeks for the results to come back. With the newly implemented Cures Act, the labs might be visible to you at the same time that they become visible to me. However, I will not address the results until all of the results are back, so please be patient.   Elevated blood pressure Monitor at home. Follow up with PCP if still high.  Follow up in 2 months or sooner if needed.  Skin care recommendations  Bath time: Always use lukewarm water. AVOID very hot or cold water. Keep bathing time to 5-10 minutes. Do NOT use bubble bath. Use a mild soap and use just enough to wash the dirty areas. Do NOT scrub skin vigorously.  After bathing, pat dry your skin with a towel. Do NOT rub or scrub the skin.  Moisturizers and prescriptions:  ALWAYS apply moisturizers immediately after bathing (within 3 minutes). This helps to lock-in moisture. Use the moisturizer several times a day over the whole body. Good summer moisturizers include: Aveeno, CeraVe, Cetaphil. Good winter moisturizers include: Aquaphor, Vaseline, Cerave, Cetaphil, Eucerin, Vanicream. When using moisturizers along with medications, the moisturizer  should be applied about one hour after applying the medication to prevent diluting effect of the medication or moisturize around where you applied the medications. When not using medications, the moisturizer can be continued twice daily as maintenance.  Laundry and clothing: Avoid laundry products with added color or perfumes. Use unscented hypo-allergenic laundry products such as Tide free, Cheer free & gentle, and All free and clear.  If the skin still seems dry or sensitive, you can try double-rinsing the clothes. Avoid tight or scratchy clothing such as wool. Do not use fabric softeners or dyer sheets.

## 2023-02-17 NOTE — Assessment & Plan Note (Addendum)
Past history - broke out in hives a few months ago which resolved with steroids and then flared again a few weeks ago. Taking benadryl prn with some benefit. Denies changes in diet, meds, personal care products or recent infections. Had hives back in 1994 once with no triggers. Interim history - 2024 bloodwork (CBC diff, CMP, ANA, CU, tryptase, alpha gal, UPEP, SPEP, crp) norma. ESR elevated, TSH low. 60% improvement in symptoms with allegra BID.  Based on clinical history, she likely has chronic idiopathic urticaria. Discussed with patient, that urticaria is usually caused by release of histamine by cutaneous mast cells but sometimes it is non-histamine mediated. Explained that urticaria is not always associated with allergies. In most cases, the exact etiology for urticaria can not be established and it is considered idiopathic. Continue Allegra (fexofenadine) 180mg  twice a day. You can take the generic versions.  If symptoms are not controlled or causes drowsiness let us know. Start pepcid (famotidine) 20mg  twice a day.  Avoid the following potential triggers: alcohol, tight clothing, NSAIDs, hot showers and getting overheated. See below for proper skin care.  Get bloodwork - repeat ESR. Follow up with PCP regarding TSH - on synthroid.

## 2023-02-17 NOTE — Assessment & Plan Note (Signed)
Monitor at home. Follow up with PCP if still high.
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
## 2023-03-05 ENCOUNTER — Encounter: Payer: Self-pay | Admitting: Neurology

## 2023-03-05 ENCOUNTER — Ambulatory Visit (INDEPENDENT_AMBULATORY_CARE_PROVIDER_SITE_OTHER): Payer: Medicare HMO | Admitting: Neurology

## 2023-03-05 VITALS — BP 141/88 | HR 90 | Ht 64.0 in | Wt 209.0 lb

## 2023-03-05 DIAGNOSIS — R700 Elevated erythrocyte sedimentation rate: Secondary | ICD-10-CM | POA: Diagnosis not present

## 2023-03-05 DIAGNOSIS — E559 Vitamin D deficiency, unspecified: Secondary | ICD-10-CM | POA: Diagnosis not present

## 2023-03-05 DIAGNOSIS — G35 Multiple sclerosis: Secondary | ICD-10-CM

## 2023-03-05 DIAGNOSIS — R799 Abnormal finding of blood chemistry, unspecified: Secondary | ICD-10-CM | POA: Diagnosis not present

## 2023-03-05 DIAGNOSIS — L508 Other urticaria: Secondary | ICD-10-CM | POA: Diagnosis not present

## 2023-03-05 MED ORDER — DIMETHYL FUMARATE 240 MG PO CPDR: 240.0000 mg | DELAYED_RELEASE_CAPSULE | Freq: Two times a day (BID) | ORAL | 11 refills | Status: DC

## 2023-03-05 MED ORDER — GABAPENTIN 300 MG PO CAPS: 300.0000 mg | ORAL_CAPSULE | Freq: Two times a day (BID) | ORAL | 3 refills | Status: DC | PRN

## 2023-03-05 NOTE — Patient Instructions (Signed)
Check MRI  of the brain, check labs Continue the Dimethyl fumerate for MS Try to get back into exercise, stretching  See you back in 6 months

## 2023-03-06 ENCOUNTER — Other Ambulatory Visit: Payer: Self-pay | Admitting: Neurology

## 2023-03-06 LAB — COMPREHENSIVE METABOLIC PANEL
ALT: 12 IU/L (ref 0–32)
AST: 19 IU/L (ref 0–40)
Albumin/Globulin Ratio: 1.6 (ref 1.2–2.2)
Albumin: 4.2 g/dL (ref 3.9–4.9)
Alkaline Phosphatase: 48 IU/L (ref 44–121)
BUN/Creatinine Ratio: 8 — ABNORMAL LOW (ref 12–28)
BUN: 7 mg/dL — ABNORMAL LOW (ref 8–27)
Bilirubin Total: 0.4 mg/dL (ref 0.0–1.2)
CO2: 23 mmol/L (ref 20–29)
Calcium: 9.4 mg/dL (ref 8.7–10.3)
Chloride: 101 mmol/L (ref 96–106)
Creatinine, Ser: 0.85 mg/dL (ref 0.57–1.00)
Globulin, Total: 2.6 g/dL (ref 1.5–4.5)
Glucose: 100 mg/dL — ABNORMAL HIGH (ref 70–99)
Potassium: 3.8 mmol/L (ref 3.5–5.2)
Sodium: 140 mmol/L (ref 134–144)
Total Protein: 6.8 g/dL (ref 6.0–8.5)
eGFR: 76 mL/min/{1.73_m2} (ref >59)

## 2023-03-06 LAB — SEDIMENTATION RATE: Sed Rate: 27 mm/hr (ref 0–40)

## 2023-03-06 LAB — CBC WITH DIFFERENTIAL/PLATELET
Basophils Absolute: 0 10*3/uL (ref 0.0–0.2)
Basos: 1 %
EOS (ABSOLUTE): 0 10*3/uL (ref 0.0–0.4)
Eos: 1 %
Hematocrit: 36.1 % (ref 34.0–46.6)
Hemoglobin: 12.2 g/dL (ref 11.1–15.9)
Immature Grans (Abs): 0 10*3/uL (ref 0.0–0.1)
Immature Granulocytes: 0 %
Lymphocytes Absolute: 2.3 10*3/uL (ref 0.7–3.1)
Lymphs: 63 %
MCH: 30 pg (ref 26.6–33.0)
MCHC: 33.8 g/dL (ref 31.5–35.7)
MCV: 89 fL (ref 79–97)
Monocytes Absolute: 0.4 10*3/uL (ref 0.1–0.9)
Monocytes: 10 %
Neutrophils Absolute: 0.9 10*3/uL — ABNORMAL LOW (ref 1.4–7.0)
Neutrophils: 25 %
Platelets: 243 10*3/uL (ref 150–450)
RBC: 4.06 x10E6/uL (ref 3.77–5.28)
RDW: 12.7 % (ref 11.7–15.4)
WBC: 3.6 10*3/uL (ref 3.4–10.8)

## 2023-03-06 LAB — VITAMIN D 25 HYDROXY (VIT D DEFICIENCY, FRACTURES): Vit D, 25-Hydroxy: 12.3 ng/mL — ABNORMAL LOW (ref 30.0–100.0)

## 2023-03-06 MED ORDER — VITAMIN D (ERGOCALCIFEROL) 1.25 MG (50000 UNIT) PO CAPS: 50000.0000 [IU] | ORAL_CAPSULE | ORAL | 0 refills | Status: DC

## 2023-03-09 ENCOUNTER — Telehealth: Payer: Self-pay | Admitting: Neurology

## 2023-03-09 NOTE — Telephone Encounter (Signed)
Francine Graven Berkley Harvey: [id number] exp. 03/09/23-04/08/23 sent to GI [phone]

## 2023-03-12 ENCOUNTER — Encounter: Payer: Self-pay | Admitting: Neurology

## 2023-03-12 DIAGNOSIS — E039 Hypothyroidism, unspecified: Secondary | ICD-10-CM | POA: Diagnosis not present

## 2023-03-12 DIAGNOSIS — L509 Urticaria, unspecified: Secondary | ICD-10-CM | POA: Diagnosis not present

## 2023-03-12 DIAGNOSIS — I10 Essential (primary) hypertension: Secondary | ICD-10-CM | POA: Diagnosis not present

## 2023-03-12 DIAGNOSIS — E7800 Pure hypercholesterolemia, unspecified: Secondary | ICD-10-CM | POA: Diagnosis not present

## 2023-03-12 DIAGNOSIS — G35 Multiple sclerosis: Secondary | ICD-10-CM | POA: Diagnosis not present

## 2023-04-08 ENCOUNTER — Ambulatory Visit
Admission: RE | Admit: 2023-04-08 | Discharge: 2023-04-08 | Disposition: A | Discharge status: Home / Self Care | Payer: Medicare HMO | Source: Ambulatory Visit | Attending: Neurology | Admitting: Neurology

## 2023-04-08 DIAGNOSIS — G35 Multiple sclerosis: Secondary | ICD-10-CM

## 2023-04-08 MED ORDER — GADOPICLENOL 0.5 MMOL/ML IV SOLN
10.0000 mL | Freq: Once | INTRAVENOUS | Status: AC | PRN
  Administered 2023-04-08: 10 mL via INTRAVENOUS

## 2023-04-16 ENCOUNTER — Other Ambulatory Visit: Payer: Medicare HMO

## 2023-04-28 ENCOUNTER — Encounter: Payer: Self-pay | Admitting: Allergy

## 2023-04-28 ENCOUNTER — Ambulatory Visit: Payer: Medicare HMO | Admitting: Allergy

## 2023-04-28 ENCOUNTER — Other Ambulatory Visit: Payer: Self-pay

## 2023-04-28 ENCOUNTER — Inpatient Hospital Stay: Admission: RE | Admit: 2023-04-28 | Payer: Medicare HMO | Source: Ambulatory Visit

## 2023-04-28 VITALS — BP 130/84 | HR 60 | Temp 98.0°F | Wt 208.5 lb

## 2023-04-28 DIAGNOSIS — L508 Other urticaria: Secondary | ICD-10-CM

## 2023-04-28 MED ORDER — FAMOTIDINE 20 MG PO TABS: 20.0000 mg | ORAL_TABLET | Freq: Two times a day (BID) | ORAL | 5 refills | Status: DC

## 2023-04-28 NOTE — Patient Instructions (Addendum)
Hives: Continue Allegra (fexofenadine) 180mg  twice a day. Continue pepcid (famotidine) 20mg  twice a day.  Avoid the following potential triggers: alcohol, tight clothing, NSAIDs, hot showers and getting overheated.  If you have no hives/itching for 1 month then:  Decrease Pepcid to 20mg  once a day. Continue with allegra 180mg  twice a day. If no symptoms for 4 weeks then: Decrease allegra 180mg  to once a day. Continue Pepcid 20mg  once a day. If no symptoms for 4 weeks then: Stop Pepcid. Continue allegra 180mg  once a day. .  If you get symptoms then go back to the dose where you didn't have any symptoms.   Follow up in 6 months or sooner if needed.

## 2023-04-28 NOTE — Progress Notes (Signed)
Follow Up Note  RE: Susan Gomez MRN: [id number] DOB: 1956/10/06 Date of Office Visit: 04/28/2023  Referring provider: Noberto Retort, MD Primary care provider: Noberto Retort, MD  Chief Complaint: Follow-up (Has been breaking out but not itchy )  History of Present Illness: I had the pleasure of seeing Susan Gomez for a follow up visit at the [facility] on 04/28/2023. She is a 67 y.o. female, who is being followed for chronic urticaria. Her previous allergy office visit was on 02/17/2023 with Dr. Selena Gomez. Today is a regular follow up visit.  Chronic urticaria Still breaking out about 1-2 times per month but it is less severe than before. Non-pruritic. Usually breaks out at night when she is due for her pills and they resolve after taking the pills.  She takes allegra 180mg  Bid and famotidine 20mg  BID.   Follows with PCP regarding her thyroid.   Assessment and Plan: Susan Gomez is a 67 y.o. female with: Chronic urticaria Past history - broke out in hives a few months ago which resolved with steroids and then flared again a few weeks ago. Taking benadryl prn with some benefit. Denies changes in diet, meds, personal care products or recent infections. Had hives back in 1994 once with no triggers. 2024 bloodwork (CBC diff, CMP, ANA, CU, tryptase, alpha gal, UPEP, SPEP, crp) norma. ESR elevated - repeat normal, TSH low. Interim history -  only breaking out 1-2 times per month with no pruritus. Resolves with taking nighttime meds. Content with current regimen. Follows with PCP regarding her thyroid.  Continue Allegra (fexofenadine) 180mg  twice a day. Continue Pepcid (famotidine) 20mg  twice a day.  Avoid the following potential triggers: alcohol, tight clothing, NSAIDs, hot showers and getting overheated. Step down schedule:  If you have no hives/itching for 1 month then:  Decrease Pepcid to 20mg  once a day. Continue with allegra 180mg  twice a day. If no symptoms for  4 weeks then: Decrease allegra 180mg  to once a day. Continue Pepcid 20mg  once a day. If no symptoms for 4 weeks then: Stop Pepcid. Continue allegra 180mg  once a day. . If you get symptoms then go back to the dose where you didn't have any symptoms.   Return in about 6 months (around 10/28/2023).  Meds ordered this encounter  Medications   famotidine (PEPCID) 20 MG tablet    Sig: Take 1 tablet (20 mg total) by mouth 2 (two) times daily.    Dispense:  60 tablet    Refill:  5   Lab Orders  No laboratory test(s) ordered today    Diagnostics: None.   Medication List:  Current Outpatient Medications  Medication Sig Dispense Refill   azelastine (ASTELIN) 0.1 % nasal spray Place 1 spray into both nostrils daily as needed for allergies.      Dimethyl Fumarate 240 MG CPDR Take 1 capsule (240 mg total) by mouth 2 (two) times daily. 60 capsule 11   famotidine (PEPCID) 20 MG tablet Take 1 tablet (20 mg total) by mouth 2 (two) times daily. 60 tablet 5   Fexofenadine HCl (ALLEGRA PO) Take by mouth.     gabapentin (NEURONTIN) 300 MG capsule Take 1 capsule (300 mg total) by mouth 2 (two) times daily as needed. 60 capsule 3   losartan-hydrochlorothiazide (HYZAAR) 100-12.5 MG per tablet Take 1 tablet by mouth daily.      SYNTHROID 112 MCG tablet Take 112 mcg by mouth daily before breakfast.     Vitamin  D, Ergocalciferol, (DRISDOL) 1.25 MG (50000 UNIT) CAPS capsule Take 1 capsule (50,000 Units total) by mouth every 7 (seven) days. (Patient not taking: Reported on 04/28/2023) 8 capsule 0   No current facility-administered medications for this visit.   Allergies: Allergies  Allergen Reactions   Codeine Other (See Comments)    Recovering addict - wishes not to consume if possible   I reviewed her past medical history, social history, family history, and environmental history and no significant changes have been reported from her previous visit.  Review of Systems  Constitutional:  Negative for  appetite change, chills, fever and unexpected weight change.  HENT:  Negative for congestion and rhinorrhea.   Eyes:  Negative for itching.  Respiratory:  Negative for cough, chest tightness, shortness of breath and wheezing.   Cardiovascular:  Negative for chest pain.  Gastrointestinal:  Negative for abdominal pain.  Genitourinary:  Negative for difficulty urinating.  Skin:  Positive for rash.  Neurological:  Negative for headaches.    Objective: BP 130/84   Pulse 60   Temp 98 F (36.7 C)   Wt 208 lb 8 oz (94.6 kg)   SpO2 97%   BMI 35.79 kg/m  Body mass index is 35.79 kg/m. Physical Exam Vitals and nursing note reviewed.  Constitutional:      Appearance: Normal appearance. She is well-developed.  HENT:     Head: Normocephalic and atraumatic.     Right Ear: Tympanic membrane and external ear normal.     Left Ear: Tympanic membrane and external ear normal.     Nose: Nose normal.     Mouth/Throat:     Mouth: Mucous membranes are moist.     Pharynx: Oropharynx is clear.  Eyes:     Conjunctiva/sclera: Conjunctivae normal.  Cardiovascular:     Rate and Rhythm: Normal rate and regular rhythm.     Heart sounds: Normal heart sounds. No murmur heard.    No friction rub. No gallop.  Pulmonary:     Effort: Pulmonary effort is normal.     Breath sounds: Normal breath sounds. No wheezing, rhonchi or rales.  Musculoskeletal:     Cervical back: Neck supple.  Skin:    General: Skin is warm.     Findings: No rash.  Neurological:     Mental Status: She is alert and oriented to person, place, and time.  Psychiatric:        Behavior: Behavior normal.    Previous notes and tests were reviewed. The plan was reviewed with the patient/family, and all questions/concerned were addressed.  It was my pleasure to see Susan Gomez today and participate in her care. Please feel free to contact me with any questions or concerns.  Sincerely,  Wyline Mood, DO Allergy & Immunology  [facility] office: [phone] [facility] office: [phone]

## 2023-04-28 NOTE — Assessment & Plan Note (Signed)
Past history - broke out in hives a few months ago which resolved with steroids and then flared again a few weeks ago. Taking benadryl prn with some benefit. Denies changes in diet, meds, personal care products or recent infections. Had hives back in 1994 once with no triggers. 2024 bloodwork (CBC diff, CMP, ANA, CU, tryptase, alpha gal, UPEP, SPEP, crp) norma. ESR elevated - repeat normal, TSH low. Interim history -  only breaking out 1-2 times per month with no pruritus. Resolves with taking nighttime meds. Content with current regimen. Follows with PCP regarding her thyroid.  Continue Allegra (fexofenadine) 180mg  twice a day. Continue Pepcid (famotidine) 20mg  twice a day.  Avoid the following potential triggers: alcohol, tight clothing, NSAIDs, hot showers and getting overheated. Step down schedule:  If you have no hives/itching for 1 month then:  Decrease Pepcid to 20mg  once a day. Continue with allegra 180mg  twice a day. If no symptoms for 4 weeks then: Decrease allegra 180mg  to once a day. Continue Pepcid 20mg  once a day. If no symptoms for 4 weeks then: Stop Pepcid. Continue allegra 180mg  once a day. . If you get symptoms then go back to the dose where you didn't have any symptoms.

## 2023-07-27 DIAGNOSIS — Z03818 Encounter for observation for suspected exposure to other biological agents ruled out: Secondary | ICD-10-CM | POA: Diagnosis not present

## 2023-07-27 DIAGNOSIS — D849 Immunodeficiency, unspecified: Secondary | ICD-10-CM | POA: Diagnosis not present

## 2023-07-27 DIAGNOSIS — B349 Viral infection, unspecified: Secondary | ICD-10-CM | POA: Diagnosis not present

## 2023-07-27 DIAGNOSIS — G35 Multiple sclerosis: Secondary | ICD-10-CM | POA: Diagnosis not present
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
## 2023-09-17 ENCOUNTER — Ambulatory Visit: Payer: Medicare HMO | Admitting: Neurology

## 2023-09-17 ENCOUNTER — Encounter: Payer: Self-pay | Admitting: Neurology

## 2023-09-17 VITALS — BP 154/96 | HR 68 | Ht 64.0 in | Wt 204.4 lb

## 2023-09-17 DIAGNOSIS — M5441 Lumbago with sciatica, right side: Secondary | ICD-10-CM | POA: Diagnosis not present

## 2023-09-17 DIAGNOSIS — R799 Abnormal finding of blood chemistry, unspecified: Secondary | ICD-10-CM | POA: Diagnosis not present

## 2023-09-17 DIAGNOSIS — G35 Multiple sclerosis: Secondary | ICD-10-CM | POA: Diagnosis not present

## 2023-09-17 DIAGNOSIS — G8929 Other chronic pain: Secondary | ICD-10-CM

## 2023-09-17 DIAGNOSIS — E559 Vitamin D deficiency, unspecified: Secondary | ICD-10-CM | POA: Diagnosis not present

## 2023-09-17 MED ORDER — DIMETHYL FUMARATE 240 MG PO CPDR: 240.0000 mg | DELAYED_RELEASE_CAPSULE | Freq: Two times a day (BID) | ORAL | 11 refills | Status: DC

## 2023-09-17 MED ORDER — GABAPENTIN 300 MG PO CAPS: 300.0000 mg | ORAL_CAPSULE | Freq: Two times a day (BID) | ORAL | 1 refills | Status: DC | PRN

## 2023-09-17 NOTE — Patient Instructions (Signed)
Check labs today  Refill medications Recommend exercise, stretching for the low back  Follow up in 6 months

## 2023-09-18 LAB — COMPREHENSIVE METABOLIC PANEL
ALT: 13 [IU]/L (ref 0–32)
AST: 18 [IU]/L (ref 0–40)
Albumin: 4.2 g/dL (ref 3.9–4.9)
Alkaline Phosphatase: 45 [IU]/L (ref 44–121)
BUN/Creatinine Ratio: 10 — ABNORMAL LOW (ref 12–28)
BUN: 7 mg/dL — ABNORMAL LOW (ref 8–27)
Bilirubin Total: 0.7 mg/dL (ref 0.0–1.2)
CO2: 25 mmol/L (ref 20–29)
Calcium: 9.5 mg/dL (ref 8.7–10.3)
Chloride: 101 mmol/L (ref 96–106)
Creatinine, Ser: 0.72 mg/dL (ref 0.57–1.00)
Globulin, Total: 2.7 g/dL (ref 1.5–4.5)
Glucose: 98 mg/dL (ref 70–99)
Potassium: 3.8 mmol/L (ref 3.5–5.2)
Sodium: 139 mmol/L (ref 134–144)
Total Protein: 6.9 g/dL (ref 6.0–8.5)
eGFR: 93 mL/min/{1.73_m2} (ref >59)

## 2023-09-18 LAB — CBC WITH DIFFERENTIAL/PLATELET
Basophils Absolute: 0.1 10*3/uL (ref 0.0–0.2)
Basos: 2 %
EOS (ABSOLUTE): 0 10*3/uL (ref 0.0–0.4)
Eos: 1 %
Hematocrit: 38.4 % (ref 34.0–46.6)
Hemoglobin: 12.6 g/dL (ref 11.1–15.9)
Immature Grans (Abs): 0 10*3/uL (ref 0.0–0.1)
Immature Granulocytes: 0 %
Lymphocytes Absolute: 2.1 10*3/uL (ref 0.7–3.1)
Lymphs: 51 %
MCH: 31 pg (ref 26.6–33.0)
MCHC: 32.8 g/dL (ref 31.5–35.7)
MCV: 94 fL (ref 79–97)
Monocytes Absolute: 0.4 10*3/uL (ref 0.1–0.9)
Monocytes: 11 %
Neutrophils Absolute: 1.4 10*3/uL (ref 1.4–7.0)
Neutrophils: 35 %
Platelets: 236 10*3/uL (ref 150–450)
RBC: 4.07 x10E6/uL (ref 3.77–5.28)
RDW: 12.7 % (ref 11.7–15.4)
WBC: 4 10*3/uL (ref 3.4–10.8)

## 2023-09-18 LAB — VITAMIN D 25 HYDROXY (VIT D DEFICIENCY, FRACTURES): Vit D, 25-Hydroxy: 13.9 ng/mL — ABNORMAL LOW (ref 30.0–100.0)

## 2023-09-21 ENCOUNTER — Telehealth: Payer: Self-pay | Admitting: Neurology

## 2023-09-21 MED ORDER — VITAMIN D (ERGOCALCIFEROL) 1.25 MG (50000 UNIT) PO CAPS: 50000.0000 [IU] | ORAL_CAPSULE | ORAL | 0 refills | Status: DC

## 2023-09-21 NOTE — Telephone Encounter (Signed)
Called and relayed message to patient, Pt verbalized understanding. Pt had no questions at this time but was encouraged to call back if questions arise.

## 2023-09-21 NOTE — Telephone Encounter (Signed)
Please call, vitamin D is low at 13.9.  Will send in prescription vitamin D, 50,000 units once weekly for 8 weeks, then start taking 1000 units daily.  Rest of blood work looks good.  Thanks  Meds ordered this encounter  Medications   Vitamin D, Ergocalciferol, (DRISDOL) 1.25 MG (50000 UNIT) CAPS capsule    Sig: Take 1 capsule (50,000 Units total) by mouth every 7 (seven) days.    Dispense:  8 capsule    Refill:  0

## 2023-10-06 DIAGNOSIS — J0100 Acute maxillary sinusitis, unspecified: Secondary | ICD-10-CM | POA: Diagnosis not present

## 2023-10-06 DIAGNOSIS — Z23 Encounter for immunization: Secondary | ICD-10-CM | POA: Diagnosis not present

## 2023-10-06 DIAGNOSIS — E7800 Pure hypercholesterolemia, unspecified: Secondary | ICD-10-CM | POA: Diagnosis not present

## 2023-10-06 DIAGNOSIS — L509 Urticaria, unspecified: Secondary | ICD-10-CM | POA: Diagnosis not present

## 2023-10-06 DIAGNOSIS — Z0000 Encounter for general adult medical examination without abnormal findings: Secondary | ICD-10-CM | POA: Diagnosis not present

## 2023-10-06 DIAGNOSIS — I10 Essential (primary) hypertension: Secondary | ICD-10-CM | POA: Diagnosis not present

## 2023-10-06 DIAGNOSIS — G35 Multiple sclerosis: Secondary | ICD-10-CM | POA: Diagnosis not present

## 2023-10-06 DIAGNOSIS — E039 Hypothyroidism, unspecified: Secondary | ICD-10-CM | POA: Diagnosis not present

## 2023-10-22 ENCOUNTER — Ambulatory Visit: Payer: Medicare HMO | Admitting: Allergy

## 2023-11-03 ENCOUNTER — Encounter: Payer: Self-pay | Admitting: Allergy

## 2023-11-03 ENCOUNTER — Ambulatory Visit: Payer: Medicare HMO | Admitting: Allergy

## 2023-11-03 ENCOUNTER — Other Ambulatory Visit: Payer: Self-pay

## 2023-11-03 VITALS — BP 120/76 | HR 56 | Temp 97.6°F | Resp 18

## 2023-11-03 DIAGNOSIS — R0981 Nasal congestion: Secondary | ICD-10-CM

## 2023-11-03 DIAGNOSIS — L508 Other urticaria: Secondary | ICD-10-CM

## 2023-11-03 MED ORDER — FLUTICASONE PROPIONATE 50 MCG/ACT NA SUSP: 1.0000 | Freq: Every day | NASAL | 5 refills | Status: DC | PRN

## 2023-11-03 NOTE — Patient Instructions (Addendum)
 Hives Continue Allegra (fexofenadine) 180mg  once a day. If hives not controlled you can take it twice a day.  Avoid the following potential triggers: alcohol, tight clothing, NSAIDs, hot showers and getting overheated.  If you have no hives/itching for 1 month then:  Stop allegra.  If you get symptoms then go back to the dose where you didn't have any symptoms.   Nasal congestion  Use Flonase  (fluticasone ) nasal spray 1-2 sprays per nostril once a day as needed for nasal congestion.  Nasal saline spray (i.e., Simply Saline) or nasal saline lavage (i.e., NeilMed) is recommended as needed and prior to medicated nasal sprays.  Follow up in 12 months or sooner if needed.

## 2023-11-03 NOTE — Progress Notes (Signed)
 Follow Up Note  RE: Susan Gomez MRN: [id number] DOB: 09/22/1956 Date of Office Visit: 11/03/2023  Referring provider: Arloa Elsie SAUNDERS, MD Primary care provider: Arloa Elsie SAUNDERS, MD  Chief Complaint: Urticaria (Started Sunday on her wrist she took allegra )  History of Present Illness: I had the pleasure of seeing Susan Gomez for a follow up visit at the [facility] on 11/03/2023. She is a 67 y.o. female, who is being followed for chronic urticaria. Her previous allergy office visit was on 04/28/2023 with Dr. Luke. Today is a new complaint visit of hives .  Discussed the use of AI scribe software for clinical note transcription with the patient, who gave verbal consent to proceed.  The patient, with a history of chronic hives, reports a significant improvement in her condition over the past six months. She has been free of itching, hives, and rashes for an extended period, which she describes as a long time. She was able to discontinue her famotidine  and Allegra due to the improvement in her symptoms.  However, the patient experienced a resurgence of itching starting two days prior to the visit. She attributes this to a recent beach visit and potential stressors, including a sinus infection treated with antibiotics two weeks prior. She resumed taking Allegra once daily to manage the itching, which she reports is most bothersome late at night.  The patient also mentions a recent adjustment in her blood pressure medication by her primary care provider due to elevated readings.     Assessment and Plan: [name] is a 67 y.o. female with: Chronic urticaria Past history - broke out in hives a few months ago which resolved with steroids and then flared again a few weeks ago. Taking benadryl prn with some benefit. Denies changes in diet, meds, personal care products or recent infections. Had hives back in 1994 once with no triggers. 2024 bloodwork (CBC diff, CMP, ANA,  CU, tryptase, alpha gal, UPEP, SPEP, crp) norma. ESR elevated - repeat normal, TSH low. Interim history -  No hives for several months and off meds (famotidine  and allegra). Recent flare. Patient self-managed with Allegra 180mg  daily with good response. No current hives. Continue Allegra (fexofenadine) 180mg  once a day. If hives not controlled you can take it twice a day.  Avoid the following potential triggers: alcohol, tight clothing, NSAIDs, hot showers and getting overheated. If you have no hives/itching for 1 month then:  Stop allegra.  If you get symptoms then go back to the dose where you didn't have any symptoms.   Nasal congestion Use Flonase  (fluticasone ) nasal spray 1-2 sprays per nostril once a day as needed for nasal congestion.  Nasal saline spray (i.e., Simply Saline) or nasal saline lavage (i.e., NeilMed) is recommended as needed and prior to medicated nasal sprays.  Return in about 1 year (around 11/02/2024).  Meds ordered this encounter  Medications   fluticasone  (FLONASE ) 50 MCG/ACT nasal spray    Sig: Place 1-2 sprays into both nostrils daily as needed (nasal congestion).    Dispense:  16 g    Refill:  5   Lab Orders  No laboratory test(s) ordered today    Diagnostics: None.  Medication List:  Current Outpatient Medications  Medication Sig Dispense Refill   azelastine (ASTELIN) 0.1 % nasal spray Place 1 spray into both nostrils daily as needed for allergies.      Dimethyl Fumarate  240 MG CPDR Take 1 capsule (240 mg total) by mouth 2 (  two) times daily. 60 capsule 11   Fexofenadine HCl (ALLEGRA PO) Take by mouth.     fluticasone  (FLONASE ) 50 MCG/ACT nasal spray Place 1-2 sprays into both nostrils daily as needed (nasal congestion). 16 g 5   gabapentin  (NEURONTIN ) 300 MG capsule Take 1 capsule (300 mg total) by mouth 2 (two) times daily as needed. 180 capsule 1   losartan -hydrochlorothiazide  (HYZAAR) 100-12.5 MG per tablet Take 1 tablet by mouth daily.       SYNTHROID  112 MCG tablet Take 112 mcg by mouth daily before breakfast.     Vitamin D , Ergocalciferol , (DRISDOL ) 1.25 MG (50000 UNIT) CAPS capsule Take 1 capsule (50,000 Units total) by mouth every 7 (seven) days. 8 capsule 0   No current facility-administered medications for this visit.   Allergies: Allergies  Allergen Reactions   Codeine Other (See Comments)    Recovering addict - wishes not to consume if possible   I reviewed her past medical history, social history, family history, and environmental history and no significant changes have been reported from her previous visit.  Review of Systems  Constitutional:  Negative for appetite change, chills, fever and unexpected weight change.  HENT:  Positive for congestion. Negative for rhinorrhea.   Eyes:  Negative for itching.  Respiratory:  Negative for cough, chest tightness, shortness of breath and wheezing.   Cardiovascular:  Negative for chest pain.  Gastrointestinal:  Negative for abdominal pain.  Genitourinary:  Negative for difficulty urinating.  Skin:  Positive for rash.  Neurological:  Negative for headaches.    Objective: BP 120/76   Pulse (!) 56   Temp 97.6 F (36.4 C)   Resp 18   SpO2 99%  There is no height or weight on file to calculate BMI. Physical Exam Vitals and nursing note reviewed.  Constitutional:      Appearance: Normal appearance. She is well-developed.  HENT:     Head: Normocephalic and atraumatic.     Right Ear: Tympanic membrane and external ear normal.     Left Ear: Tympanic membrane and external ear normal.     Nose: Congestion present.     Mouth/Throat:     Mouth: Mucous membranes are moist.     Pharynx: Oropharynx is clear.  Eyes:     Conjunctiva/sclera: Conjunctivae normal.  Cardiovascular:     Rate and Rhythm: Normal rate and regular rhythm.     Heart sounds: Normal heart sounds. No murmur heard.    No friction rub. No gallop.  Pulmonary:     Effort: Pulmonary effort is normal.      Breath sounds: Normal breath sounds. No wheezing, rhonchi or rales.  Musculoskeletal:     Cervical back: Neck supple.  Skin:    General: Skin is warm.     Findings: No rash.  Neurological:     Mental Status: She is alert and oriented to person, place, and time.  Psychiatric:        Behavior: Behavior normal.   Previous notes and tests were reviewed. The plan was reviewed with the patient/family, and all questions/concerned were addressed.  It was my pleasure to see Riya today and participate in her care. Please feel free to contact me with any questions or concerns.  Sincerely,  Orlan Cramp, DO Allergy & Immunology  [facility]  [place] office: [phone] [facility] office: [phone]

## 2023-11-05 ENCOUNTER — Ambulatory Visit
Admission: RE | Admit: 2023-11-05 | Discharge: 2023-11-05 | Disposition: A | Discharge status: Home / Self Care | Payer: Medicare HMO | Source: Ambulatory Visit | Attending: Family Medicine | Admitting: Family Medicine

## 2023-11-05 ENCOUNTER — Inpatient Hospital Stay: Admission: RE | Admit: 2023-11-05 | Payer: Medicare HMO | Source: Ambulatory Visit

## 2023-11-05 DIAGNOSIS — M8588 Other specified disorders of bone density and structure, other site: Secondary | ICD-10-CM | POA: Diagnosis not present

## 2023-11-05 DIAGNOSIS — E2839 Other primary ovarian failure: Secondary | ICD-10-CM | POA: Diagnosis not present

## 2023-11-05 DIAGNOSIS — N958 Other specified menopausal and perimenopausal disorders: Secondary | ICD-10-CM | POA: Diagnosis not present

## 2023-11-05 DIAGNOSIS — Z90722 Acquired absence of ovaries, bilateral: Secondary | ICD-10-CM | POA: Diagnosis not present

## 2023-11-17 DIAGNOSIS — H353121 Nonexudative age-related macular degeneration, left eye, early dry stage: Secondary | ICD-10-CM | POA: Diagnosis not present

## 2023-11-17 DIAGNOSIS — H43813 Vitreous degeneration, bilateral: Secondary | ICD-10-CM | POA: Diagnosis not present

## 2023-11-17 DIAGNOSIS — H2513 Age-related nuclear cataract, bilateral: Secondary | ICD-10-CM | POA: Diagnosis not present

## 2023-11-17 DIAGNOSIS — H353212 Exudative age-related macular degeneration, right eye, with inactive choroidal neovascularization: Secondary | ICD-10-CM | POA: Diagnosis not present

## 2023-12-01 DIAGNOSIS — R1013 Epigastric pain: Secondary | ICD-10-CM | POA: Diagnosis not present

## 2023-12-03 DIAGNOSIS — R1013 Epigastric pain: Secondary | ICD-10-CM | POA: Diagnosis not present

## 2023-12-08 DIAGNOSIS — J069 Acute upper respiratory infection, unspecified: Secondary | ICD-10-CM | POA: Diagnosis not present

## 2023-12-29 DIAGNOSIS — J0100 Acute maxillary sinusitis, unspecified: Secondary | ICD-10-CM | POA: Diagnosis not present

## 2024-01-01 ENCOUNTER — Other Ambulatory Visit: Payer: Self-pay | Admitting: Family Medicine

## 2024-01-01 DIAGNOSIS — Z1231 Encounter for screening mammogram for malignant neoplasm of breast: Secondary | ICD-10-CM

## 2024-01-19 ENCOUNTER — Telehealth: Payer: Self-pay | Admitting: Neurology

## 2024-01-19 MED ORDER — GABAPENTIN 300 MG PO CAPS: 300.0000 mg | ORAL_CAPSULE | Freq: Three times a day (TID) | ORAL | 3 refills | Status: AC | PRN

## 2024-01-19 NOTE — Telephone Encounter (Signed)
 I called patient, she complains of few days history of bilateral upper back discomfort, denies gait abnormality, no lower extremity paresthesia or weakness  Reviewed previous MRIs, only has supratentorium involvement, many years stable course taking that dimethyl fumarate,  For current complaint most suggestive of musculoskeletal etiology, she has been taking gabapentin 300 twice a day as needed, which has been helpful  I have suggested  1.  Higher dose of gabapentin 300 mg 3 times daily  2, NSAIDs as needed 3 back stretching exercise 4 warm compression  Please move up her follow-up with Maralyn Sago

## 2024-01-19 NOTE — Telephone Encounter (Signed)
 Pt called stating that she is needing to speak to the RN or Provider regarding her MS pain she is experiencing from her breast to her back. Please advise.

## 2024-01-19 NOTE — Telephone Encounter (Signed)
 Call to patient, she reports having pain under her rib cage all around to her back. She feels "tightness around her waistline to her back". She says it started Friday night. She reports continued pain in her right leg. She believes this is related to her MS. She denies any stroke symptoms. Denies difficulty breathing. She reports this is the first time this has happened and would like to know what to do to make it better. Advised I would send to Dr, Terrace Arabia to review and call back with her recommendations.

## 2024-01-20 NOTE — Telephone Encounter (Signed)
 Call to patient to move appointment up with Maralyn Sago, no answer. Left message to call back

## 2024-01-21 NOTE — Telephone Encounter (Signed)
 Lvm 1st attempt 01/21/24 by hf

## 2024-01-21 NOTE — Telephone Encounter (Signed)
 Pt has been Scheduled with NP on 01/28/24 @ 8:15am
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
## 2024-01-28 ENCOUNTER — Encounter: Payer: Self-pay | Admitting: Neurology

## 2024-01-28 ENCOUNTER — Ambulatory Visit (INDEPENDENT_AMBULATORY_CARE_PROVIDER_SITE_OTHER): Admitting: Neurology

## 2024-01-28 VITALS — BP 138/86 | HR 70 | Ht 64.0 in | Wt 202.0 lb

## 2024-01-28 DIAGNOSIS — M5441 Lumbago with sciatica, right side: Secondary | ICD-10-CM | POA: Diagnosis not present

## 2024-01-28 DIAGNOSIS — G8929 Other chronic pain: Secondary | ICD-10-CM | POA: Diagnosis not present

## 2024-01-28 DIAGNOSIS — G35 Multiple sclerosis: Secondary | ICD-10-CM

## 2024-01-28 NOTE — Patient Instructions (Signed)
 Continue dimethyl fumarate.  Check MRI imaging to look for MS exacerbation or spine abnormality contributing to symptoms.  Check routine labs today.  Continue gabapentin as needed.  Call for new or worsening symptoms.  Follow-up 6 months.  Thanks  Orders Placed This Encounter  Procedures   MR BRAIN W WO CONTRAST   MR CERVICAL SPINE W WO CONTRAST   MR THORACIC SPINE W WO CONTRAST   MR LUMBAR SPINE WO CONTRAST   CBC with Differential/Platelet   CMP

## 2024-01-29 ENCOUNTER — Encounter: Payer: Self-pay | Admitting: Neurology

## 2024-01-29 LAB — CBC WITH DIFFERENTIAL/PLATELET
Basophils Absolute: 0.1 10*3/uL (ref 0.0–0.2)
Basos: 1 %
EOS (ABSOLUTE): 0.1 10*3/uL (ref 0.0–0.4)
Eos: 1 %
Hematocrit: 34.4 % (ref 34.0–46.6)
Hemoglobin: 11.7 g/dL (ref 11.1–15.9)
Immature Grans (Abs): 0 10*3/uL (ref 0.0–0.1)
Immature Granulocytes: 0 %
Lymphocytes Absolute: 2.7 10*3/uL (ref 0.7–3.1)
Lymphs: 62 %
MCH: 31 pg (ref 26.6–33.0)
MCHC: 34 g/dL (ref 31.5–35.7)
MCV: 91 fL (ref 79–97)
Monocytes Absolute: 0.4 10*3/uL (ref 0.1–0.9)
Monocytes: 9 %
Neutrophils Absolute: 1.2 10*3/uL — ABNORMAL LOW (ref 1.4–7.0)
Neutrophils: 27 %
Platelets: 291 10*3/uL (ref 150–450)
RBC: 3.78 x10E6/uL (ref 3.77–5.28)
RDW: 13.1 % (ref 11.7–15.4)
WBC: 4.4 10*3/uL (ref 3.4–10.8)

## 2024-01-29 LAB — COMPREHENSIVE METABOLIC PANEL WITH GFR
ALT: 10 IU/L (ref 0–32)
AST: 15 IU/L (ref 0–40)
Albumin: 4.1 g/dL (ref 3.9–4.9)
Alkaline Phosphatase: 48 IU/L (ref 44–121)
BUN/Creatinine Ratio: 14 (ref 12–28)
BUN: 10 mg/dL (ref 8–27)
Bilirubin Total: 0.5 mg/dL (ref 0.0–1.2)
CO2: 23 mmol/L (ref 20–29)
Calcium: 9.3 mg/dL (ref 8.7–10.3)
Chloride: 101 mmol/L (ref 96–106)
Creatinine, Ser: 0.72 mg/dL (ref 0.57–1.00)
Globulin, Total: 2.9 g/dL (ref 1.5–4.5)
Glucose: 94 mg/dL (ref 70–99)
Potassium: 3.9 mmol/L (ref 3.5–5.2)
Sodium: 141 mmol/L (ref 134–144)
Total Protein: 7 g/dL (ref 6.0–8.5)
eGFR: 93 mL/min/{1.73_m2} (ref >59)

## 2024-02-01 ENCOUNTER — Telehealth: Payer: Self-pay | Admitting: Neurology

## 2024-02-01 NOTE — Telephone Encounter (Signed)
 Ethlyn Gallery: [id number] exp. 02/01/24-04/01/24 sent to GI [phone]

## 2024-02-02 ENCOUNTER — Ambulatory Visit
Admission: RE | Admit: 2024-02-02 | Discharge: 2024-02-02 | Disposition: A | Discharge status: Home / Self Care | Payer: Medicare HMO | Source: Ambulatory Visit | Attending: Family Medicine | Admitting: Family Medicine

## 2024-02-02 DIAGNOSIS — Z1231 Encounter for screening mammogram for malignant neoplasm of breast: Secondary | ICD-10-CM

## 2024-02-06 NOTE — Progress Notes (Signed)
 Chart reviewed, agree above plan ?

## 2024-02-11 ENCOUNTER — Ambulatory Visit: Admitting: Neurology

## 2024-02-11 ENCOUNTER — Telehealth: Payer: Self-pay | Admitting: Neurology

## 2024-02-11 NOTE — Telephone Encounter (Signed)
 [employer]/ Susan Gomez confirm how to process the case for this member; if we need to continue the review or cancel the request for carpal tunnel release.

## 2024-02-15 NOTE — Telephone Encounter (Signed)
 Call to [facility] [employer], advised Dr. Tressie Fryer was not in our practice.

## 2024-03-08 ENCOUNTER — Ambulatory Visit
Admission: RE | Admit: 2024-03-08 | Discharge: 2024-03-08 | Disposition: A | Discharge status: Home / Self Care | Source: Ambulatory Visit | Attending: Neurology | Admitting: Neurology

## 2024-03-08 DIAGNOSIS — G35 Multiple sclerosis: Secondary | ICD-10-CM | POA: Diagnosis not present

## 2024-03-08 MED ORDER — GADOPICLENOL 0.5 MMOL/ML IV SOLN
9.0000 mL | Freq: Once | INTRAVENOUS | Status: AC | PRN
  Administered 2024-03-08: 9 mL via INTRAVENOUS

## 2024-03-10 ENCOUNTER — Ambulatory Visit
Admission: RE | Admit: 2024-03-10 | Discharge: 2024-03-10 | Disposition: A | Discharge status: Home / Self Care | Source: Ambulatory Visit | Attending: Neurology | Admitting: Neurology

## 2024-03-10 DIAGNOSIS — G35 Multiple sclerosis: Secondary | ICD-10-CM | POA: Diagnosis not present

## 2024-03-10 MED ORDER — GADOPICLENOL 0.5 MMOL/ML IV SOLN
9.0000 mL | Freq: Once | INTRAVENOUS | Status: AC | PRN
  Administered 2024-03-10: 9 mL via INTRAVENOUS

## 2024-03-15 ENCOUNTER — Telehealth: Payer: Self-pay | Admitting: Neurology

## 2024-03-15 DIAGNOSIS — G35 Multiple sclerosis: Secondary | ICD-10-CM

## 2024-03-15 NOTE — Telephone Encounter (Signed)
 I called the patient reviewed MRI findings:  -MRI brain showing stable MS lesions, no acute plaques -MRI cervical spine C3-C4 disc protrusion, indenting the ventral spinal cord with mild stenosis and right foraminal stenosis; C4-C5 disc bulging with mild spinal stenosis and moderate right foraminal stenosis.  -MRI thoracic spine is normal  -MRI Lumbar Spine L2-3, L3-4: disc bulging and facet hypertrophy with mild right foraminal stenosis.   She reports hugging sensation has resolved. Continues with low back pain, some pain into both legs. Would recommend referral to neurosurgery, for evaluation of C3-C4 disc protrusion indenting spinal cord. She isn't sure she is interested, wants to print imaging reports so husband can review he is a PT.   MRI brain with without contrast demonstrating: - Stable, mild periventricular, subcortical, pontine foci of chronic demyelinating plaques.   - No acute plaques.   MRI cervical spine with and without contrast imaging: - Multilevel degenerative spondylosis and disc bulging as above. - At C3-4 posterior disc protrusion indenting the ventral spinal cord, with mild spinal stenosis and mild right foraminal stenosis. - At C4-5 disc bulging with mild spinal stenosis and moderate right foraminal stenosis. - At C5-6 disc bulging with mild bilateral foraminal stenosis. - No intrinsic, compressive or abnormal enhancing spinal cord lesions.  Normal MRI thoracic spine (with and without).   MRI lumbar spine (without) demonstrating: - At L2-3, L3-4: disc bulging and facet hypertrophy with mild right foraminal stenosis.

## 2024-03-17 ENCOUNTER — Telehealth: Payer: Self-pay | Admitting: Neurology

## 2024-03-17 NOTE — Addendum Note (Signed)
 Addended by: Wess Hammed on: 03/17/2024 08:43 AM   Modules accepted: Orders

## 2024-03-17 NOTE — Telephone Encounter (Signed)
 Referral for neurosurgery fax to [facility]. Phone: [phone], Fax: [phone]

## 2024-03-17 NOTE — Telephone Encounter (Signed)
 Patient returned call and would not elaborate. She stated she was returning Isa Manuel call with her decision and  only wanted to speak with Isa Manuel. I asked if she if this was in regards to her referral to neurosurgery and patient said yes. She again requests to speak to sarah. Advised she is seeing patients and I would send her a message to return her call.

## 2024-03-17 NOTE — Telephone Encounter (Signed)
 I called the patient.  She is agreeable to referral to neurosurgery.  Orders Placed This Encounter  Procedures   Ambulatory referral to Neurosurgery

## 2024-04-05 DIAGNOSIS — I10 Essential (primary) hypertension: Secondary | ICD-10-CM | POA: Diagnosis not present

## 2024-04-05 DIAGNOSIS — E7800 Pure hypercholesterolemia, unspecified: Secondary | ICD-10-CM | POA: Diagnosis not present

## 2024-04-05 DIAGNOSIS — E039 Hypothyroidism, unspecified: Secondary | ICD-10-CM | POA: Diagnosis not present

## 2024-04-05 DIAGNOSIS — M545 Low back pain, unspecified: Secondary | ICD-10-CM | POA: Diagnosis not present

## 2024-04-05 DIAGNOSIS — L509 Urticaria, unspecified: Secondary | ICD-10-CM | POA: Diagnosis not present

## 2024-04-05 DIAGNOSIS — G35 Multiple sclerosis: Secondary | ICD-10-CM | POA: Diagnosis not present

## 2024-04-12 DIAGNOSIS — M47812 Spondylosis without myelopathy or radiculopathy, cervical region: Secondary | ICD-10-CM | POA: Diagnosis not present

## 2024-04-12 DIAGNOSIS — M4802 Spinal stenosis, cervical region: Secondary | ICD-10-CM | POA: Diagnosis not present

## 2024-04-12 DIAGNOSIS — Z6833 Body mass index (BMI) 33.0-33.9, adult: Secondary | ICD-10-CM | POA: Diagnosis not present

## 2024-04-20 ENCOUNTER — Ambulatory Visit: Payer: Medicare HMO | Admitting: Neurology

## 2024-05-03 ENCOUNTER — Telehealth: Payer: Self-pay | Admitting: Neurology

## 2024-05-03 NOTE — Telephone Encounter (Signed)
 Pt stopped in to have 1 form signed by Lauraine Born, per Adrien, N/C for this. Pt says to fax completed form to # on form. Left form and pink slip in POD 2

## 2024-05-03 NOTE — Telephone Encounter (Signed)
 Call to patient she reports wanting to speak to sarah only to discuss a form that needs to filled out. I advised sarah was seeing patients and our protocol is to have paper dropped off for us  to review and complete if provider is agreeable. I did inform her some forms require a $50 fee. She could not tell me what the form was for but it was from [facility] and it was a medical care privacy statement. She is in agreement to drop form by and we will review and complete if provider agreeable.

## 2024-05-03 NOTE — Telephone Encounter (Signed)
 Pt called to request to speak to MD . Pt states she had sent paperwork  over for MD to sign and send back to company . Pt did not have company  name more number at  time of call , However the Pt request for a call back . Pt did not  want to go into details  about paperwork .

## 2024-05-04 ENCOUNTER — Telehealth: Payer: Self-pay

## 2024-05-04 NOTE — Telephone Encounter (Signed)
 Form has been faxed to [facility] at [phone]. LVM for patient letting her know as well

## 2024-05-04 NOTE — Telephone Encounter (Signed)
 Susan Gomez

## 2024-08-02 ENCOUNTER — Ambulatory Visit: Admitting: Neurology

## 2024-08-02 ENCOUNTER — Encounter: Payer: Self-pay | Admitting: Neurology

## 2024-08-02 VITALS — BP 115/76 | HR 72 | Ht 64.0 in | Wt 192.2 lb

## 2024-08-02 DIAGNOSIS — G35 Multiple sclerosis: Secondary | ICD-10-CM

## 2024-08-02 DIAGNOSIS — G35A Relapsing-remitting multiple sclerosis: Secondary | ICD-10-CM

## 2024-08-02 DIAGNOSIS — M5441 Lumbago with sciatica, right side: Secondary | ICD-10-CM | POA: Diagnosis not present

## 2024-08-02 DIAGNOSIS — R269 Unspecified abnormalities of gait and mobility: Secondary | ICD-10-CM | POA: Diagnosis not present

## 2024-08-02 DIAGNOSIS — G8929 Other chronic pain: Secondary | ICD-10-CM | POA: Diagnosis not present

## 2024-08-02 MED ORDER — DIMETHYL FUMARATE 240 MG PO CPDR: 240.0000 mg | DELAYED_RELEASE_CAPSULE | Freq: Two times a day (BID) | ORAL | 11 refills | Status: DC
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
## 2024-08-02 NOTE — Patient Instructions (Signed)
 Great to see you today Susan Gomez! Continue current medications Check labs Continue exercise Follow-up in 6 months.  Call for any new or worsening symptoms.

## 2024-08-08 ENCOUNTER — Telehealth: Payer: Self-pay | Admitting: Neurology

## 2024-08-08 NOTE — Telephone Encounter (Signed)
 Please make sure she comes for labs. When she was here last week the phlebotomist was not here. Thanks

## 2024-08-23 ENCOUNTER — Other Ambulatory Visit: Payer: Self-pay

## 2024-08-23 ENCOUNTER — Other Ambulatory Visit (INDEPENDENT_AMBULATORY_CARE_PROVIDER_SITE_OTHER): Payer: Self-pay

## 2024-08-23 DIAGNOSIS — G35A Relapsing-remitting multiple sclerosis: Secondary | ICD-10-CM

## 2024-08-23 DIAGNOSIS — Z0289 Encounter for other administrative examinations: Secondary | ICD-10-CM

## 2024-08-23 MED ORDER — DIMETHYL FUMARATE 240 MG PO CPDR: 240.0000 mg | DELAYED_RELEASE_CAPSULE | Freq: Two times a day (BID) | ORAL | 11 refills | Status: AC

## 2024-08-23 NOTE — Addendum Note (Signed)
 Addended by: JOSHUA IZETTA CROME on: 08/23/2024 03:17 PM   Modules accepted: Orders

## 2024-08-24 ENCOUNTER — Ambulatory Visit: Payer: Self-pay | Admitting: Neurology

## 2024-08-24 LAB — CBC WITH DIFFERENTIAL/PLATELET
Basophils Absolute: 0 x10E3/uL (ref 0.0–0.2)
Basos: 1 %
EOS (ABSOLUTE): 0 x10E3/uL (ref 0.0–0.4)
Eos: 1 %
Hematocrit: 36.1 % (ref 34.0–46.6)
Hemoglobin: 12.5 g/dL (ref 11.1–15.9)
Immature Grans (Abs): 0 x10E3/uL (ref 0.0–0.1)
Immature Granulocytes: 0 %
Lymphocytes Absolute: 2.3 x10E3/uL (ref 0.7–3.1)
Lymphs: 52 %
MCH: 31.4 pg (ref 26.6–33.0)
MCHC: 34.6 g/dL (ref 31.5–35.7)
MCV: 91 fL (ref 79–97)
Monocytes Absolute: 0.6 x10E3/uL (ref 0.1–0.9)
Monocytes: 13 %
Neutrophils Absolute: 1.4 x10E3/uL (ref 1.4–7.0)
Neutrophils: 33 %
Platelets: 296 x10E3/uL (ref 150–450)
RBC: 3.98 x10E6/uL (ref 3.77–5.28)
RDW: 13.2 % (ref 11.7–15.4)
WBC: 4.4 x10E3/uL (ref 3.4–10.8)

## 2024-08-24 LAB — COMPREHENSIVE METABOLIC PANEL WITH GFR
ALT: 8 IU/L (ref 0–32)
AST: 13 IU/L (ref 0–40)
Albumin: 4.4 g/dL (ref 3.9–4.9)
Alkaline Phosphatase: 47 IU/L — ABNORMAL LOW (ref 49–135)
BUN/Creatinine Ratio: 10 — ABNORMAL LOW (ref 12–28)
BUN: 7 mg/dL — ABNORMAL LOW (ref 8–27)
Bilirubin Total: 0.7 mg/dL (ref 0.0–1.2)
CO2: 26 mmol/L (ref 20–29)
Calcium: 9.7 mg/dL (ref 8.7–10.3)
Chloride: 89 mmol/L — ABNORMAL LOW (ref 96–106)
Creatinine, Ser: 0.71 mg/dL (ref 0.57–1.00)
Globulin, Total: 2.8 g/dL (ref 1.5–4.5)
Glucose: 86 mg/dL (ref 70–99)
Potassium: 4.1 mmol/L (ref 3.5–5.2)
Sodium: 129 mmol/L — ABNORMAL LOW (ref 134–144)
Total Protein: 7.2 g/dL (ref 6.0–8.5)
eGFR: 94 mL/min/1.73 (ref >59)

## 2024-08-24 LAB — VITAMIN D 25 HYDROXY (VIT D DEFICIENCY, FRACTURES): Vit D, 25-Hydroxy: 19.4 ng/mL — ABNORMAL LOW (ref 30.0–100.0)

## 2024-08-24 MED ORDER — VITAMIN D (ERGOCALCIFEROL) 1.25 MG (50000 UNIT) PO CAPS: 50000.0000 [IU] | ORAL_CAPSULE | ORAL | 0 refills | Status: AC

## 2024-08-24 NOTE — Telephone Encounter (Signed)
 Please call, labs came back showing low sodium at 129, unclear reason for this? Would recommend rechecking for accuracy this week with her PCP. Can we send labs over for their review? Her Vitamin D  is low again at 19. I am going to send in 50,000 units weekly for 8 weeks then start 2000 units daily OTC. Thanks   Meds ordered this encounter  Medications   Vitamin D , Ergocalciferol , (DRISDOL ) 1.25 MG (50000 UNIT) CAPS capsule    Sig: Take 1 capsule (50,000 Units total) by mouth every 7 (seven) days.    Dispense:  8 capsule    Refill:  0

## 2024-08-26 DIAGNOSIS — J069 Acute upper respiratory infection, unspecified: Secondary | ICD-10-CM | POA: Diagnosis not present

## 2024-08-29 DIAGNOSIS — J019 Acute sinusitis, unspecified: Secondary | ICD-10-CM | POA: Diagnosis not present

## 2024-10-31 NOTE — Progress Notes (Unsigned)
 "  Follow Up Note  RE: Susan Gomez MRN: [id number] DOB: 1956-01-03 Date of Office Visit: 11/01/2024  Referring provider: Arloa Elsie SAUNDERS, MD Primary care provider: Arloa Elsie SAUNDERS, MD  Chief Complaint: No chief complaint on file.  History of Present Illness: I had the pleasure of seeing Susan Gomez for a follow up visit at the [facility] on 11/01/2024. She is a 68 y.o. female, who is being followed for chronic urticaria, nasal congestion. Her previous allergy office visit was on 11/03/2023 with Dr. Luke. Today is a regular follow up visit.  Discussed the use of AI scribe software for clinical note transcription with the patient, who gave verbal consent to proceed.  History of Present Illness            ***  Assessment and Plan: Susan Gomez is a 68 y.o. female with: Chronic urticaria Past history - broke out in hives a few months ago which resolved with steroids and then flared again a few weeks ago. Taking benadryl prn with some benefit. Denies changes in diet, meds, personal care products or recent infections. Had hives back in 1994 once with no triggers. 2024 bloodwork (CBC diff, CMP, ANA, CU, tryptase, alpha gal, UPEP, SPEP, crp) norma. ESR elevated - repeat normal, TSH low. Interim history -  No hives for several months and off meds (famotidine  and allegra). Recent flare. Patient self-managed with Allegra 180mg  daily with good response. No current hives. Continue Allegra (fexofenadine) 180mg  once a day. If hives not controlled you can take it twice a day.  Avoid the following potential triggers: alcohol, tight clothing, NSAIDs, hot showers and getting overheated. If you have no hives/itching for 1 month then:  Stop allegra.  If you get symptoms then go back to the dose where you didn't have any symptoms.    Nasal congestion Use Flonase  (fluticasone ) nasal spray 1-2 sprays per nostril once a day as needed for nasal congestion.  Nasal saline spray  (i.e., Simply Saline) or nasal saline lavage (i.e., NeilMed) is recommended as needed and prior to medicated nasal sprays. Assessment and Plan              No follow-ups on file.  No orders of the defined types were placed in this encounter.  Lab Orders  No laboratory test(s) ordered today    Diagnostics: Spirometry:  Tracings reviewed. Her effort: {Blank single:19197::Good reproducible efforts.,It was hard to get consistent efforts and there is a question as to whether this reflects a maximal maneuver.,Poor effort, data can not be interpreted.} FVC: ***L FEV1: ***L, ***% predicted FEV1/FVC ratio: ***% Interpretation: {Blank single:19197::Spirometry consistent with mild obstructive disease,Spirometry consistent with moderate obstructive disease,Spirometry consistent with severe obstructive disease,Spirometry consistent with possible restrictive disease,Spirometry consistent with mixed obstructive and restrictive disease,Spirometry uninterpretable due to technique,Spirometry consistent with normal pattern,No overt abnormalities noted given today's efforts}.  Please see scanned spirometry results for details.  Skin Testing: {Blank single:19197::Select foods,Environmental allergy panel,Environmental allergy panel and select foods,Food allergy panel,None,Deferred due to recent antihistamines use}. *** Results discussed with patient/family.   Medication List:  Current Outpatient Medications  Medication Sig Dispense Refill   azelastine (ASTELIN) 0.1 % nasal spray Place 1 spray into both nostrils daily as needed for allergies.      Dimethyl Fumarate  240 MG CPDR Take 1 capsule (240 mg total) by mouth 2 (two) times daily. 60 capsule 11   Fexofenadine HCl (ALLEGRA PO) Take by mouth.     fluticasone  (FLONASE ) 50 MCG/ACT  nasal spray Place 1-2 sprays into both nostrils daily as needed (nasal congestion). 16 g 5   gabapentin  (NEURONTIN ) 300 MG capsule  Take 1 capsule (300 mg total) by mouth 3 (three) times daily as needed. 270 capsule 3   losartan -hydrochlorothiazide  (HYZAAR) 100-12.5 MG per tablet Take 1 tablet by mouth daily.      SYNTHROID  112 MCG tablet Take 112 mcg by mouth daily before breakfast.     Vitamin D , Ergocalciferol , (DRISDOL ) 1.25 MG (50000 UNIT) CAPS capsule Take 1 capsule (50,000 Units total) by mouth every 7 (seven) days. 8 capsule 0   No current facility-administered medications for this visit.   Allergies: Allergies[1] I reviewed her past medical history, social history, family history, and environmental history and no significant changes have been reported from her previous visit.  Review of Systems  Constitutional:  Negative for appetite change, chills, fever and unexpected weight change.  HENT:  Positive for congestion. Negative for rhinorrhea.   Eyes:  Negative for itching.  Respiratory:  Negative for cough, chest tightness, shortness of breath and wheezing.   Cardiovascular:  Negative for chest pain.  Gastrointestinal:  Negative for abdominal pain.  Genitourinary:  Negative for difficulty urinating.  Skin:  Positive for rash.  Neurological:  Negative for headaches.    Objective: There were no vitals taken for this visit. There is no height or weight on file to calculate BMI. Physical Exam Vitals and nursing note reviewed.  Constitutional:      Appearance: Normal appearance. She is well-developed.  HENT:     Head: Normocephalic and atraumatic.     Right Ear: Tympanic membrane and external ear normal.     Left Ear: Tympanic membrane and external ear normal.     Nose: Congestion present.     Mouth/Throat:     Mouth: Mucous membranes are moist.     Pharynx: Oropharynx is clear.  Eyes:     Conjunctiva/sclera: Conjunctivae normal.  Cardiovascular:     Rate and Rhythm: Normal rate and regular rhythm.     Heart sounds: Normal heart sounds. No murmur heard.    No friction rub. No gallop.  Pulmonary:      Effort: Pulmonary effort is normal.     Breath sounds: Normal breath sounds. No wheezing, rhonchi or rales.  Musculoskeletal:     Cervical back: Neck supple.  Skin:    General: Skin is warm.     Findings: No rash.  Neurological:     Mental Status: She is alert and oriented to person, place, and time.  Psychiatric:        Behavior: Behavior normal.   Previous notes and tests were reviewed. The plan was reviewed with the patient/family, and all questions/concerned were addressed.  It was my pleasure to see Susan Gomez today and participate in her care. Please feel free to contact me with any questions or concerns.  Sincerely,  Orlan Cramp, DO Allergy & Immunology  [facility]  [facility] office: [phone] [facility] office: [phone]     [1] Allergies Allergen Reactions   Codeine Other (See Comments)    Recovering addict - wishes not to consume if possible  "

## 2024-11-01 ENCOUNTER — Encounter: Payer: Self-pay | Admitting: Allergy

## 2024-11-01 ENCOUNTER — Ambulatory Visit: Payer: Medicare HMO | Admitting: Allergy

## 2024-11-01 ENCOUNTER — Other Ambulatory Visit: Payer: Self-pay

## 2024-11-01 VITALS — BP 126/70 | HR 62 | Temp 97.9°F | Resp 18 | Wt 184.4 lb

## 2024-11-01 DIAGNOSIS — J3489 Other specified disorders of nose and nasal sinuses: Secondary | ICD-10-CM | POA: Diagnosis not present

## 2024-11-01 DIAGNOSIS — L508 Other urticaria: Secondary | ICD-10-CM | POA: Diagnosis not present

## 2024-11-01 DIAGNOSIS — R0981 Nasal congestion: Secondary | ICD-10-CM

## 2024-11-01 NOTE — Patient Instructions (Addendum)
 Hives Monitor symptoms.  If you start breaking out again then: Start Allegra (fexofenadine) 180mg  once a day. If hives not controlled you can take it twice a day.  Avoid the following potential triggers: alcohol, tight clothing, NSAIDs, hot showers and getting overheated.  Runny nose Use azelastine nasal spray 1-2 sprays per nostril twice a day as needed for runny nose/drainage. Nasal saline spray (i.e., Simply Saline) or nasal saline lavage (i.e., NeilMed) is recommended as needed and prior to medicated nasal sprays.  Follow up as needed.

## 2025-03-02 ENCOUNTER — Ambulatory Visit: Admitting: Neurology
